# Patient Record
Sex: Female | Born: 1990 | Race: Black or African American | Hispanic: No | Marital: Married | Smoking: Never smoker
Health system: Southern US, Community
[De-identification: ages and names within clinical notes are randomized; demographics above are authoritative.]

## PROBLEM LIST (undated history)

## (undated) ENCOUNTER — Inpatient Hospital Stay (HOSPITAL_COMMUNITY): Payer: Self-pay

## (undated) DIAGNOSIS — F419 Anxiety disorder, unspecified: Secondary | ICD-10-CM

## (undated) DIAGNOSIS — N760 Acute vaginitis: Secondary | ICD-10-CM

## (undated) DIAGNOSIS — M545 Low back pain, unspecified: Secondary | ICD-10-CM

## (undated) DIAGNOSIS — T783XXA Angioneurotic edema, initial encounter: Secondary | ICD-10-CM

## (undated) DIAGNOSIS — G629 Polyneuropathy, unspecified: Secondary | ICD-10-CM

## (undated) DIAGNOSIS — R569 Unspecified convulsions: Secondary | ICD-10-CM

## (undated) DIAGNOSIS — N83209 Unspecified ovarian cyst, unspecified side: Secondary | ICD-10-CM

## (undated) DIAGNOSIS — D649 Anemia, unspecified: Secondary | ICD-10-CM

## (undated) DIAGNOSIS — E559 Vitamin D deficiency, unspecified: Secondary | ICD-10-CM

## (undated) DIAGNOSIS — B9689 Other specified bacterial agents as the cause of diseases classified elsewhere: Secondary | ICD-10-CM

## (undated) DIAGNOSIS — L509 Urticaria, unspecified: Secondary | ICD-10-CM

## (undated) HISTORY — PX: MULTIPLE TOOTH EXTRACTIONS: SHX2053

## (undated) HISTORY — DX: Low back pain, unspecified: M54.50

## (undated) HISTORY — DX: Polyneuropathy, unspecified: G62.9

## (undated) HISTORY — DX: Urticaria, unspecified: L50.9

## (undated) HISTORY — DX: Angioneurotic edema, initial encounter: T78.3XXA

## (undated) HISTORY — DX: Vitamin D deficiency, unspecified: E55.9

## (undated) HISTORY — DX: Low back pain: M54.5

## (undated) HISTORY — DX: Anxiety disorder, unspecified: F41.9

## (undated) HISTORY — DX: Anemia, unspecified: D64.9

## (undated) HISTORY — PX: SALPINGECTOMY: SHX328

## (undated) HISTORY — DX: Unspecified convulsions: R56.9

---

## 2009-12-25 ENCOUNTER — Emergency Department (HOSPITAL_BASED_OUTPATIENT_CLINIC_OR_DEPARTMENT_OTHER): Admission: EM | Admit: 2009-12-25 | Discharge: 2009-12-26 | Payer: Self-pay | Admitting: Emergency Medicine

## 2010-08-13 LAB — WET PREP, GENITAL
Trich, Wet Prep: NONE SEEN
Yeast Wet Prep HPF POC: NONE SEEN

## 2010-08-13 LAB — URINE MICROSCOPIC-ADD ON

## 2010-08-13 LAB — URINALYSIS, ROUTINE W REFLEX MICROSCOPIC
Glucose, UA: NEGATIVE mg/dL
Hgb urine dipstick: NEGATIVE
Ketones, ur: NEGATIVE mg/dL
Protein, ur: NEGATIVE mg/dL
Urobilinogen, UA: 1 mg/dL (ref 0.0–1.0)

## 2010-08-13 LAB — GC/CHLAMYDIA PROBE AMP, GENITAL: Chlamydia, DNA Probe: POSITIVE — AB

## 2011-06-28 ENCOUNTER — Emergency Department (HOSPITAL_BASED_OUTPATIENT_CLINIC_OR_DEPARTMENT_OTHER)
Admission: EM | Admit: 2011-06-28 | Discharge: 2011-06-28 | Disposition: A | Discharge status: Home / Self Care | Payer: Self-pay | Attending: Emergency Medicine | Admitting: Emergency Medicine

## 2011-06-28 ENCOUNTER — Encounter (HOSPITAL_BASED_OUTPATIENT_CLINIC_OR_DEPARTMENT_OTHER): Payer: Self-pay | Admitting: *Deleted

## 2011-06-28 DIAGNOSIS — R109 Unspecified abdominal pain: Secondary | ICD-10-CM | POA: Insufficient documentation

## 2011-06-28 DIAGNOSIS — R52 Pain, unspecified: Secondary | ICD-10-CM

## 2011-06-28 DIAGNOSIS — R350 Frequency of micturition: Secondary | ICD-10-CM | POA: Insufficient documentation

## 2011-06-28 DIAGNOSIS — N83209 Unspecified ovarian cyst, unspecified side: Secondary | ICD-10-CM | POA: Insufficient documentation

## 2011-06-28 LAB — URINALYSIS, ROUTINE W REFLEX MICROSCOPIC
Bilirubin Urine: NEGATIVE
Glucose, UA: NEGATIVE mg/dL
Hgb urine dipstick: NEGATIVE
Ketones, ur: NEGATIVE mg/dL
Protein, ur: NEGATIVE mg/dL
pH: 6.5 (ref 5.0–8.0)

## 2011-06-28 LAB — WET PREP, GENITAL
Trich, Wet Prep: NONE SEEN
Yeast Wet Prep HPF POC: NONE SEEN

## 2011-06-28 NOTE — ED Notes (Signed)
C/o lower abd pain and urinary frequency

## 2011-06-28 NOTE — ED Provider Notes (Signed)
Complains of right lower quadrant pain sudden onset one hour prior to coming here pain much improved with time without treatment feels like ovarian cyst that she's had in the past, only today's episode not as severe discomfort is minimal at present on exam alert appears comfortable right lower quadrant minimally tender no guarding rigidity or rebound Suspect ruptured ovarian cyst  Doug Sou, MD 06/28/11 2119

## 2011-06-28 NOTE — ED Notes (Signed)
NP at bedside.

## 2011-06-28 NOTE — ED Notes (Signed)
Pelvic cart to bs  

## 2011-06-28 NOTE — ED Provider Notes (Signed)
Medical screening examination/treatment/procedure(s) were conducted as a shared visit with non-physician practitioner(s) and myself.  I personally evaluated the patient during the encounter  Doug Sou, MD 06/28/11 2351

## 2011-06-28 NOTE — ED Provider Notes (Signed)
History     CSN: [id number]  Arrival date & time 06/28/11  2029   First MD Initiated Contact with Patient 06/28/11 2101      Chief Complaint  Patient presents with  . Urinary Frequency  . Abdominal Pain    (Consider location/radiation/quality/duration/timing/severity/associated sxs/prior treatment) Patient is a 21 y.o. female presenting with frequency and abdominal pain. The history is provided by the patient. No language interpreter was used.  Urinary Frequency This is a new problem. The current episode started today. The problem occurs intermittently. The problem has been gradually improving. Associated symptoms include abdominal pain. The symptoms are aggravated by walking. She has tried nothing for the symptoms.  Abdominal Pain The primary symptoms of the illness include abdominal pain and vaginal discharge.  Additional symptoms associated with the illness include frequency.    History reviewed. No pertinent past medical history.  History reviewed. No pertinent past surgical history.  No family history on file.  History  Substance Use Topics  . Smoking status: Never Smoker   . Smokeless tobacco: Not on file  . Alcohol Use: No    OB History    Grav Para Term Preterm Abortions TAB SAB Ect Mult Living                  Review of Systems  Gastrointestinal: Positive for abdominal pain.  Genitourinary: Positive for frequency and vaginal discharge.  All other systems reviewed and are negative.    Allergies  Review of patient's allergies indicates not on file.  Home Medications  No current outpatient prescriptions on file.  BP 111/62  Pulse 83  Temp(Src) 98.1 F (36.7 C) (Oral)  Resp 18  Ht 5\' 5"  (1.651 m)  Wt 130 lb (58.968 kg)  BMI 21.63 kg/m2  SpO2 100%  LMP 06/16/2011  Physical Exam  Nursing note and vitals reviewed. Constitutional: She is oriented to person, place, and time. She appears well-developed and well-nourished.  HENT:  Head:  Normocephalic.  Eyes: Conjunctivae are normal. Pupils are equal, round, and reactive to light.  Neck: Normal range of motion. Neck supple.  Cardiovascular: Normal rate, regular rhythm and normal heart sounds.   Pulmonary/Chest: Effort normal and breath sounds normal.  Abdominal: Soft. Bowel sounds are normal.  Genitourinary: Vagina normal. Cervix exhibits no motion tenderness, no discharge and no friability. Right adnexum displays tenderness.  Musculoskeletal: Normal range of motion.  Neurological: She is alert and oriented to person, place, and time.  Skin: Skin is warm and dry.  Psychiatric: She has a normal mood and affect.    ED Course  Procedures (including critical care time)  Labs Reviewed  URINALYSIS, ROUTINE W REFLEX MICROSCOPIC - Abnormal; Notable for the following:    APPearance CLOUDY (*)    All other components within normal limits  PREGNANCY, URINE   No results found.   No diagnosis found.  Possible ruptured ovarian cyst--will have patient return tomorrow for outpatient ultrasound.  MDM          Jimmye Norman, NP 06/28/11 2204

## 2011-06-29 ENCOUNTER — Other Ambulatory Visit (HOSPITAL_BASED_OUTPATIENT_CLINIC_OR_DEPARTMENT_OTHER): Payer: Self-pay

## 2011-06-29 ENCOUNTER — Other Ambulatory Visit (HOSPITAL_BASED_OUTPATIENT_CLINIC_OR_DEPARTMENT_OTHER): Payer: Self-pay | Admitting: Nurse Practitioner

## 2011-06-29 DIAGNOSIS — R1021 Pelvic and perineal pain right side: Secondary | ICD-10-CM

## 2011-06-29 DIAGNOSIS — R102 Pelvic and perineal pain: Secondary | ICD-10-CM

## 2011-06-29 LAB — GC/CHLAMYDIA PROBE AMP, GENITAL: Chlamydia, DNA Probe: NEGATIVE

## 2011-07-04 ENCOUNTER — Inpatient Hospital Stay (HOSPITAL_BASED_OUTPATIENT_CLINIC_OR_DEPARTMENT_OTHER): Admission: RE | Admit: 2011-07-04 | Payer: Self-pay | Source: Ambulatory Visit

## 2012-04-15 ENCOUNTER — Emergency Department (HOSPITAL_BASED_OUTPATIENT_CLINIC_OR_DEPARTMENT_OTHER)
Admission: EM | Admit: 2012-04-15 | Discharge: 2012-04-15 | Disposition: A | Discharge status: Home / Self Care | Payer: Self-pay | Attending: Emergency Medicine | Admitting: Emergency Medicine

## 2012-04-15 ENCOUNTER — Encounter (HOSPITAL_BASED_OUTPATIENT_CLINIC_OR_DEPARTMENT_OTHER): Payer: Self-pay | Admitting: *Deleted

## 2012-04-15 DIAGNOSIS — N760 Acute vaginitis: Secondary | ICD-10-CM | POA: Insufficient documentation

## 2012-04-15 DIAGNOSIS — J029 Acute pharyngitis, unspecified: Secondary | ICD-10-CM | POA: Insufficient documentation

## 2012-04-15 DIAGNOSIS — B9689 Other specified bacterial agents as the cause of diseases classified elsewhere: Secondary | ICD-10-CM

## 2012-04-15 LAB — URINALYSIS, ROUTINE W REFLEX MICROSCOPIC
Bilirubin Urine: NEGATIVE
Ketones, ur: 15 mg/dL — AB
Nitrite: NEGATIVE
Protein, ur: NEGATIVE mg/dL
Urobilinogen, UA: 1 mg/dL (ref 0.0–1.0)
pH: 7.5 (ref 5.0–8.0)

## 2012-04-15 LAB — URINE MICROSCOPIC-ADD ON

## 2012-04-15 LAB — WET PREP, GENITAL
Trich, Wet Prep: NONE SEEN
Yeast Wet Prep HPF POC: NONE SEEN

## 2012-04-15 MED ORDER — METRONIDAZOLE 500 MG PO TABS: 500.0000 mg | ORAL_TABLET | Freq: Two times a day (BID) | ORAL | Status: DC

## 2012-04-15 NOTE — ED Provider Notes (Signed)
Medical screening examination/treatment/procedure(s) were performed by non-physician practitioner and as supervising physician I was immediately available for consultation/collaboration.   Shiron Whetsel B. Bernette Mayers, MD 04/15/12 2303

## 2012-04-15 NOTE — ED Notes (Signed)
Pt c/o vaginal discharge x 2 weeks  

## 2012-04-15 NOTE — ED Provider Notes (Signed)
History     CSN: [id number]  Arrival date & time 04/15/12  2028   First MD Initiated Contact with Patient 04/15/12 2130      Chief Complaint  Patient presents with  . Vaginal Discharge    (Consider location/radiation/quality/duration/timing/severity/associated sxs/prior treatment) Patient is a 21 y.o. female presenting with vaginal discharge. The history is provided by the patient. No language interpreter was used.  Vaginal Discharge This is a new problem. The current episode started today. The problem occurs constantly. The problem has been gradually worsening. Associated symptoms include a sore throat. Nothing aggravates the symptoms. She has tried nothing for the symptoms. The treatment provided moderate relief.  Pt complains of vaginal odor and discharge  History reviewed. No pertinent past medical history.  History reviewed. No pertinent past surgical history.  History reviewed. No pertinent family history.  History  Substance Use Topics  . Smoking status: Never Smoker   . Smokeless tobacco: Not on file  . Alcohol Use: No    OB History    Grav Para Term Preterm Abortions TAB SAB Ect Mult Living                  Review of Systems  HENT: Positive for sore throat.   Genitourinary: Positive for vaginal discharge.  All other systems reviewed and are negative.    Allergies  Review of patient's allergies indicates no known allergies.  Home Medications  No current outpatient prescriptions on file.  BP 113/65  Pulse 80  Temp 98.8 F (37.1 C) (Oral)  Resp 18  Ht 5\' 5"  (1.651 m)  Wt 130 lb (58.968 kg)  BMI 21.63 kg/m2  SpO2 100%  LMP 03/27/2012  Physical Exam  Nursing note and vitals reviewed. Constitutional: She appears well-developed and well-nourished.  HENT:  Head: Normocephalic and atraumatic.  Right Ear: External ear normal.  Left Ear: External ear normal.  Nose: Nose normal.  Mouth/Throat: Oropharynx is clear and moist.  Eyes: Pupils are  equal, round, and reactive to light.  Cardiovascular: Normal rate.   Pulmonary/Chest: Effort normal.  Abdominal: Soft. There is no tenderness.  Genitourinary: Uterus normal. Vaginal discharge found.       Adnexa nontender,  Musculoskeletal: Normal range of motion.  Neurological: She is alert.  Skin: Skin is warm.    ED Course  Procedures (including critical care time)  Labs Reviewed  URINALYSIS, ROUTINE W REFLEX MICROSCOPIC - Abnormal; Notable for the following:    APPearance CLOUDY (*)     Specific Gravity, Urine 1.031 (*)     Ketones, ur 15 (*)     Leukocytes, UA SMALL (*)     All other components within normal limits  URINE MICROSCOPIC-ADD ON - Abnormal; Notable for the following:    Squamous Epithelial / LPF FEW (*)     All other components within normal limits  WET PREP, GENITAL - Abnormal; Notable for the following:    Clue Cells Wet Prep HPF POC MODERATE (*)     WBC, Wet Prep HPF POC FEW (*)     All other components within normal limits  PREGNANCY, URINE  GC/CHLAMYDIA PROBE AMP   No results found.   No diagnosis found.    MDM  Rx for flagyl        Elson Areas, [place] 04/15/12 2227

## 2012-04-17 LAB — GC/CHLAMYDIA PROBE AMP: GC Probe RNA: NEGATIVE

## 2012-08-25 ENCOUNTER — Encounter (HOSPITAL_COMMUNITY): Payer: Self-pay | Admitting: *Deleted

## 2012-08-25 ENCOUNTER — Inpatient Hospital Stay (HOSPITAL_COMMUNITY): Payer: Medicaid Other

## 2012-08-25 ENCOUNTER — Inpatient Hospital Stay (HOSPITAL_COMMUNITY)
Admission: AD | Admit: 2012-08-25 | Discharge: 2012-08-25 | Disposition: A | Discharge status: Home / Self Care | Payer: Medicaid Other | Source: Ambulatory Visit | Attending: Obstetrics & Gynecology | Admitting: Obstetrics & Gynecology

## 2012-08-25 DIAGNOSIS — O2390 Unspecified genitourinary tract infection in pregnancy, unspecified trimester: Secondary | ICD-10-CM | POA: Insufficient documentation

## 2012-08-25 DIAGNOSIS — N760 Acute vaginitis: Secondary | ICD-10-CM

## 2012-08-25 DIAGNOSIS — O26899 Other specified pregnancy related conditions, unspecified trimester: Secondary | ICD-10-CM

## 2012-08-25 DIAGNOSIS — B9689 Other specified bacterial agents as the cause of diseases classified elsewhere: Secondary | ICD-10-CM

## 2012-08-25 DIAGNOSIS — A499 Bacterial infection, unspecified: Secondary | ICD-10-CM

## 2012-08-25 DIAGNOSIS — R109 Unspecified abdominal pain: Secondary | ICD-10-CM | POA: Insufficient documentation

## 2012-08-25 HISTORY — DX: Other specified bacterial agents as the cause of diseases classified elsewhere: N76.0

## 2012-08-25 HISTORY — DX: Other specified bacterial agents as the cause of diseases classified elsewhere: B96.89

## 2012-08-25 HISTORY — DX: Unspecified ovarian cyst, unspecified side: N83.209

## 2012-08-25 LAB — URINALYSIS, ROUTINE W REFLEX MICROSCOPIC
Bilirubin Urine: NEGATIVE
Glucose, UA: NEGATIVE mg/dL
Hgb urine dipstick: NEGATIVE
Ketones, ur: NEGATIVE mg/dL
Nitrite: NEGATIVE
Protein, ur: NEGATIVE mg/dL
Specific Gravity, Urine: 1.025 (ref 1.005–1.030)
Urobilinogen, UA: 1 mg/dL (ref 0.0–1.0)
pH: 5.5 (ref 5.0–8.0)

## 2012-08-25 LAB — WET PREP, GENITAL
Trich, Wet Prep: NONE SEEN
Yeast Wet Prep HPF POC: NONE SEEN

## 2012-08-25 LAB — CBC
Hemoglobin: 11.6 g/dL — ABNORMAL LOW (ref 12.0–15.0)
MCH: 30.8 pg (ref 26.0–34.0)
MCV: 88.1 fL (ref 78.0–100.0)
RBC: 3.77 MIL/uL — ABNORMAL LOW (ref 3.87–5.11)

## 2012-08-25 LAB — URINE MICROSCOPIC-ADD ON

## 2012-08-25 MED ORDER — METRONIDAZOLE 500 MG PO TABS: 500.0000 mg | ORAL_TABLET | Freq: Two times a day (BID) | ORAL | Status: DC

## 2012-08-25 NOTE — MAU Note (Signed)
History of ovarian cyst and has been having shocking pains in the bottom of the abdomen. +HPT

## 2012-08-25 NOTE — MAU Provider Note (Signed)
History     CSN: [id number]  Arrival date & time 08/25/12  1911   None     Chief Complaint  Patient presents with  . Abdominal Pain    (Consider location/radiation/quality/duration/timing/severity/associated sxs/prior treatment) HPI Debra Pruitt is a 22 y.o. G1P0 at [redacted]w[redacted]d. She presents with c/o low abd pain off/on x 2-3 days. The pain is sharp shooting, "shocking", mostly in RLQ. No bleeding or spotting,. Has clumpy white vaginal discharge like a yeast infection, occ itching,had 1 spot of blood 3 d ago. Has frequency, no urgency or burning, no GI changes. Same partner x 2 yr, no hx STD. Hx ovarian cyst 2 yr ago, no f/u.   Past Medical History  Diagnosis Date  . Bacterial vaginosis   . Ovarian cyst     Past Surgical History  Procedure Laterality Date  . Multiple tooth extractions      Family History  Problem Relation Age of Onset  . Hypertension Mother   . Hypertension Maternal Grandmother   . Asthma Maternal Grandmother   . Hypertension Paternal Grandmother   . COPD Paternal Grandmother   . Heart disease Paternal Grandmother   . Hyperlipidemia Paternal Grandmother   . Stroke Paternal Grandmother     History  Substance Use Topics  . Smoking status: Never Smoker   . Smokeless tobacco: Not on file  . Alcohol Use: No    OB History   Grav Para Term Preterm Abortions TAB SAB Ect Mult Living   1               Review of Systems  Constitutional: Negative for fever and chills.  Gastrointestinal: Negative for nausea, vomiting, diarrhea and constipation.  Genitourinary: Positive for frequency, vaginal discharge and pelvic pain. Negative for dysuria, urgency and vaginal bleeding.    Allergies  Review of patient's allergies indicates no known allergies.  Home Medications  No current outpatient prescriptions on file.  BP 123/62  Pulse 91  Temp(Src) 99.6 F (37.6 C) (Oral)  Resp 16  Ht 5\' 5"  (1.651 m)  Wt 135 lb 6 oz (61.406 kg)  BMI 22.53 kg/m2  LMP  07/19/2012  Physical Exam  Constitutional: She is oriented to person, place, and time. She appears well-developed and well-nourished.  Abdominal: Soft. There is no tenderness.  Genitourinary:  Pelvic: Vulva- nl anatomy, 1-2 mm abrasion posterior introitus from recent intercourse Vagina- mod amt creamy,frothy yellow vaginal discharge Cx- closed Uterus- nl size, non tender Adn- non tender, no masses palp  Musculoskeletal: Normal range of motion.  Neurological: She is alert and oriented to person, place, and time.  Skin: Skin is warm and dry.  Psychiatric: She has a normal mood and affect. Her behavior is normal.    ED Course  Procedures (including critical care time)  Labs Reviewed  URINALYSIS, ROUTINE W REFLEX MICROSCOPIC - Abnormal; Notable for the following:    Leukocytes, UA MODERATE (*)    All other components within normal limits  URINE MICROSCOPIC-ADD ON - Abnormal; Notable for the following:    Squamous Epithelial / LPF FEW (*)    All other components within normal limits  POCT PREGNANCY, URINE - Abnormal; Notable for the following:    Preg Test, Ur POSITIVE (*)    All other components within normal limits  GC/CHLAMYDIA PROBE AMP  WET PREP, GENITAL  CBC  HCG, QUANTITATIVE, PREGNANCY  ABO/RH   No results found. Results for orders placed during the hospital encounter of 08/25/12 (from the past 24 hour(s))  URINALYSIS, ROUTINE W REFLEX MICROSCOPIC     Status: Abnormal   Collection Time    08/25/12  7:15 PM      Result Value Range   Color, Urine YELLOW  YELLOW   APPearance CLEAR  CLEAR   Specific Gravity, Urine 1.025  1.005 - 1.030   pH 5.5  5.0 - 8.0   Glucose, UA NEGATIVE  NEGATIVE mg/dL   Hgb urine dipstick NEGATIVE  NEGATIVE   Bilirubin Urine NEGATIVE  NEGATIVE   Ketones, ur NEGATIVE  NEGATIVE mg/dL   Protein, ur NEGATIVE  NEGATIVE mg/dL   Urobilinogen, UA 1.0  0.0 - 1.0 mg/dL   Nitrite NEGATIVE  NEGATIVE   Leukocytes, UA MODERATE (*) NEGATIVE  URINE  MICROSCOPIC-ADD ON     Status: Abnormal   Collection Time    08/25/12  7:15 PM      Result Value Range   Squamous Epithelial / LPF FEW (*) RARE   WBC, UA 7-10  <3 WBC/hpf   RBC / HPF 0-2  <3 RBC/hpf   Bacteria, UA RARE  RARE  POCT PREGNANCY, URINE     Status: Abnormal   Collection Time    08/25/12  7:26 PM      Result Value Range   Preg Test, Ur POSITIVE (*) NEGATIVE  CBC     Status: Abnormal   Collection Time    08/25/12  7:34 PM      Result Value Range   WBC 8.9  4.0 - 10.5 K/uL   RBC 3.77 (*) 3.87 - 5.11 MIL/uL   Hemoglobin 11.6 (*) 12.0 - 15.0 g/dL   HCT 16.1 (*) 09.6 - 04.5 %   MCV 88.1  78.0 - 100.0 fL   MCH 30.8  26.0 - 34.0 pg   MCHC 34.9  30.0 - 36.0 g/dL   RDW 40.9  81.1 - 91.4 %   Platelets 212  150 - 400 K/uL  HCG, QUANTITATIVE, PREGNANCY     Status: Abnormal   Collection Time    08/25/12  7:34 PM      Result Value Range   hCG, Beta Chain, Quant, S 6883 (*) <5 mIU/mL  ABO/RH     Status: None   Collection Time    08/25/12  7:34 PM      Result Value Range   ABO/RH(D) B POS    WET PREP, GENITAL     Status: Abnormal   Collection Time    08/25/12  7:54 PM      Result Value Range   Yeast Wet Prep HPF POC NONE SEEN  NONE SEEN   Trich, Wet Prep NONE SEEN  NONE SEEN   Clue Cells Wet Prep HPF POC MANY (*) NONE SEEN   WBC, Wet Prep HPF POC MANY (*) NONE SEEN   US Ob Comp Less 14 Wks  08/25/2012  *RADIOLOGY REPORT*  Clinical Data: Pelvic pain  OBSTETRIC <14 WK [place] AND TRANSVAGINAL OB US  Technique:  Both transabdominal and transvaginal ultrasound examinations were performed for complete evaluation of the gestation as well as the maternal uterus, adnexal regions, and pelvic cul-de-sac.  Transvaginal technique was performed to assess early pregnancy.  Comparison:  None.  Intrauterine gestational sac:  Oval shaped and fundal. Yolk sac: Present. Embryo: Not visualized. Cardiac Activity: Not applicable. Heart Rate: Not applicable. bpm  MSD: 9.2 mm  fine w for d  Maternal  uterus/adnexae: There is a 2.4 x 2.4 x 2.8 cm fundal fibroid. Small right ovarian corpus luteum  cyst is noted.  Small amount of free fluid is present.  IMPRESSION: There is a gestational sac and yolk sac but no embryo.  Mean sac diameter is only 9 mm.  This is nonspecific and may represent early pregnancy. Serial beta HCG levels and 1-week follow-up ultrasound is recommended to ensure development of an embryo.   Original Report Authenticated By: Jolaine Click, M.D.    US Ob Transvaginal  08/25/2012  *RADIOLOGY REPORT*  Clinical Data: Pelvic pain  OBSTETRIC <14 WK [place] AND TRANSVAGINAL OB US  Technique:  Both transabdominal and transvaginal ultrasound examinations were performed for complete evaluation of the gestation as well as the maternal uterus, adnexal regions, and pelvic cul-de-sac.  Transvaginal technique was performed to assess early pregnancy.  Comparison:  None.  Intrauterine gestational sac:  Oval shaped and fundal. Yolk sac: Present. Embryo: Not visualized. Cardiac Activity: Not applicable. Heart Rate: Not applicable. bpm  MSD: 9.2 mm  fine w for d  Maternal uterus/adnexae: There is a 2.4 x 2.4 x 2.8 cm fundal fibroid. Small right ovarian corpus luteum cyst is noted.  Small amount of free fluid is present.  IMPRESSION: There is a gestational sac and yolk sac but no embryo.  Mean sac diameter is only 9 mm.  This is nonspecific and may represent early pregnancy. Serial beta HCG levels and 1-week follow-up ultrasound is recommended to ensure development of an embryo.   Original Report Authenticated By: Jolaine Click, M.D.      No diagnosis found.    MDM  ASSESSMENT:  Bacterial vaginosis 5 3/7 wks IUGS with yolk sac  PLAN:  Flagyl 500mg  bid x 7 d Preg verification letter to pt To call for an appt to start prenatal care when gets medicaid card

## 2012-08-26 LAB — ABO/RH: ABO/RH(D): B POS

## 2012-08-28 NOTE — MAU Provider Note (Signed)
Attestation of Attending Supervision of Advanced Practitioner (CNM/NP): Evaluation and management procedures were performed by the Advanced Practitioner under my supervision and collaboration. I have reviewed the Advanced Practitioner's note and chart, and I agree with the management and plan.  LEGGETT,KELLY H. 11:31 AM   

## 2012-10-03 ENCOUNTER — Encounter: Payer: Self-pay | Admitting: Obstetrics

## 2013-03-22 ENCOUNTER — Emergency Department (HOSPITAL_BASED_OUTPATIENT_CLINIC_OR_DEPARTMENT_OTHER)
Admission: EM | Admit: 2013-03-22 | Discharge: 2013-03-22 | Disposition: A | Discharge status: Home / Self Care | Payer: Medicaid Other | Attending: Emergency Medicine | Admitting: Emergency Medicine

## 2013-03-22 ENCOUNTER — Encounter (HOSPITAL_BASED_OUTPATIENT_CLINIC_OR_DEPARTMENT_OTHER): Payer: Self-pay | Admitting: Emergency Medicine

## 2013-03-22 DIAGNOSIS — R350 Frequency of micturition: Secondary | ICD-10-CM | POA: Insufficient documentation

## 2013-03-22 DIAGNOSIS — R109 Unspecified abdominal pain: Secondary | ICD-10-CM | POA: Insufficient documentation

## 2013-03-22 DIAGNOSIS — Z8742 Personal history of other diseases of the female genital tract: Secondary | ICD-10-CM | POA: Insufficient documentation

## 2013-03-22 DIAGNOSIS — Z79899 Other long term (current) drug therapy: Secondary | ICD-10-CM | POA: Insufficient documentation

## 2013-03-22 DIAGNOSIS — O9989 Other specified diseases and conditions complicating pregnancy, childbirth and the puerperium: Secondary | ICD-10-CM | POA: Insufficient documentation

## 2013-03-22 DIAGNOSIS — O26899 Other specified pregnancy related conditions, unspecified trimester: Secondary | ICD-10-CM

## 2013-03-22 LAB — URINE MICROSCOPIC-ADD ON

## 2013-03-22 LAB — URINALYSIS, ROUTINE W REFLEX MICROSCOPIC
Bilirubin Urine: NEGATIVE
Hgb urine dipstick: NEGATIVE
Specific Gravity, Urine: 1.021 (ref 1.005–1.030)
pH: 7 (ref 5.0–8.0)

## 2013-03-22 NOTE — ED Provider Notes (Signed)
Medical screening examination/treatment/procedure(s) were performed by non-physician practitioner and as supervising physician I was immediately available for consultation/collaboration.  EKG Interpretation   None         Debra Pruitt. Maryl Blalock, MD 03/22/13 2351

## 2013-03-22 NOTE — ED Provider Notes (Signed)
CSN: [id number]     Arrival date & time 03/22/13  1941 History   First MD Initiated Contact with Patient 03/22/13 2054     Chief Complaint  Patient presents with  . abd pain-pregnancy    (Consider location/radiation/quality/duration/timing/severity/associated sxs/prior Treatment) The history is provided by the patient.     G1P0 gestation 35 weeks, 2 days reports constant lower abdominal cramping that feels like menstrual cramps that began last night.  Pain is worse with movement and with walking.  Denies vaginal discharge, bleeding.  Denies fevers, chills, N/V/D, change in bowel habits, urinary symptoms.   [facility] Drs Ferdinand Cava and Delford Field.  Last appointment was 10/15.  She has had regular prenatal care and has had no complications or problems during this pregnancy. Patient states the baby is moving very well and that is unchanged today.   Past Medical History  Diagnosis Date  . Bacterial vaginosis   . Ovarian cyst    Past Surgical History  Procedure Laterality Date  . Multiple tooth extractions     Family History  Problem Relation Age of Onset  . Hypertension Mother   . Hypertension Maternal Grandmother   . Asthma Maternal Grandmother   . Hypertension Paternal Grandmother   . COPD Paternal Grandmother   . Heart disease Paternal Grandmother   . Hyperlipidemia Paternal Grandmother   . Stroke Paternal Grandmother    History  Substance Use Topics  . Smoking status: Never Smoker   . Smokeless tobacco: Not on file  . Alcohol Use: No   OB History   Grav Para Term Preterm Abortions TAB SAB Ect Mult Living   1              Review of Systems  Constitutional: Negative for fever.  Gastrointestinal: Positive for abdominal pain. Negative for nausea, vomiting and diarrhea.  Genitourinary: Positive for frequency. Negative for dysuria, urgency, vaginal bleeding and vaginal discharge.    Allergies  Review of patient's allergies indicates no known allergies.  Home  Medications   Current Outpatient Rx  Name  Route  Sig  Dispense  Refill  . Prenatal Vit-Fe Fumarate-FA (PRENATAL MULTIVITAMIN) TABS tablet   Oral   Take 1 tablet by mouth daily at 12 noon.         . metroNIDAZOLE (FLAGYL) 500 MG tablet   Oral   Take 1 tablet (500 mg total) by mouth 2 (two) times daily.   14 tablet   0    BP 118/67  Pulse 86  Temp(Src) 99 F (37.2 C) (Oral)  Resp 20  Ht 5\' 5"  (1.651 m)  Wt 165 lb (74.844 kg)  BMI 27.46 kg/m2  SpO2 100%  LMP 07/19/2012 Physical Exam  Nursing note and vitals reviewed. Constitutional: She appears well-developed and well-nourished. No distress.  HENT:  Head: Normocephalic and atraumatic.  Neck: Neck supple.  Cardiovascular: Normal rate and regular rhythm.   Pulmonary/Chest: Effort normal and breath sounds normal. No respiratory distress. She has no wheezes. She has no rales.  Abdominal: Soft. She exhibits no distension. There is tenderness. There is no rebound and no guarding.  gravid  Genitourinary:  Sterile cervical exam 10:11 PM Cervix is closed, average consistency, moderately long.  Baby's head is not descended.   Neurological: She is alert.  Skin: She is not diaphoretic.    ED Course  Procedures (including critical care time) Labs Review Labs Reviewed  URINALYSIS, ROUTINE W REFLEX MICROSCOPIC - Abnormal; Notable for the following:    Leukocytes,  UA TRACE (*)    All other components within normal limits  URINE MICROSCOPIC-ADD ON - Abnormal; Notable for the following:    Squamous Epithelial / LPF FEW (*)    Bacteria, UA FEW (*)    All other components within normal limits  URINE CULTURE   Imaging Review No results found.  EKG Interpretation   None      Pt declines medication for her pain.   MDM   1. Abdominal pain in pregnancy    Pregnant patient with abdominal pain, present only with her own movement.  No contractions on monitor, cervix is closed, no blood.  Pt is not in active labor.  As the pain  is only present with movement and with walking I suspect the pain is related to round ligament pain or other discomforts of later pregnancy.  No e/o UTI on UA.  Discussed pt with Dr Oletta Lamas.  Pt to be d/c home with OB follow up.  Pt advised of all findings and agrees with plan.  She has OB appt in 4 days, I have advised her to call first thing Monday morning, also to go directly to [facility] if she has continued or worsening pain or any new /concerning symptoms.  Pt made aware she is always welcome to return to [facility] ED but that it would be better and a more direct route to her OB to go to [facility]. Discussed findings, treatment, and follow up  with patient.  Pt given return precautions.  Pt verbalizes understanding and agrees with plan.         Trixie Dredge, PA-C 03/22/13 2328  Trixie Dredge, PA-C 03/22/13 2330

## 2013-03-22 NOTE — ED Notes (Signed)
Pt report abd pain onset this AM 0800 and increased during the day denies spotting or bleeding or drainage. Pt report this is her first child

## 2013-03-22 NOTE — Progress Notes (Signed)
Pt presents to [facility] with complaints of contractions. Pt is laughing and smiling per RN. No acute distress. Pt recieves care at [facility] and clinic.

## 2013-03-24 LAB — URINE CULTURE

## 2013-06-30 ENCOUNTER — Encounter (HOSPITAL_COMMUNITY): Payer: Self-pay | Admitting: *Deleted

## 2014-03-30 ENCOUNTER — Encounter (HOSPITAL_COMMUNITY): Payer: Self-pay | Admitting: *Deleted

## 2014-07-28 ENCOUNTER — Emergency Department (HOSPITAL_BASED_OUTPATIENT_CLINIC_OR_DEPARTMENT_OTHER)
Admission: EM | Admit: 2014-07-28 | Discharge: 2014-07-28 | Disposition: A | Discharge status: Home / Self Care | Payer: 59 | Attending: Emergency Medicine | Admitting: Emergency Medicine

## 2014-07-28 ENCOUNTER — Encounter (HOSPITAL_BASED_OUTPATIENT_CLINIC_OR_DEPARTMENT_OTHER): Payer: Self-pay | Admitting: *Deleted

## 2014-07-28 DIAGNOSIS — Z8742 Personal history of other diseases of the female genital tract: Secondary | ICD-10-CM | POA: Insufficient documentation

## 2014-07-28 DIAGNOSIS — M5416 Radiculopathy, lumbar region: Secondary | ICD-10-CM

## 2014-07-28 DIAGNOSIS — M545 Low back pain: Secondary | ICD-10-CM | POA: Diagnosis present

## 2014-07-28 MED ORDER — HYDROCODONE-ACETAMINOPHEN 5-325 MG PO TABS: 2.0000 | ORAL_TABLET | ORAL | Status: DC | PRN

## 2014-07-28 MED ORDER — HYDROCODONE-ACETAMINOPHEN 5-325 MG PO TABS: 1.0000 | ORAL_TABLET | Freq: Four times a day (QID) | ORAL | Status: DC | PRN

## 2014-07-28 NOTE — ED Notes (Signed)
Pt c/o lower back pain which radiates down right leg x 3 weeks

## 2014-07-28 NOTE — ED Provider Notes (Signed)
CSN: [id number]     Arrival date & time 07/28/14  1301 History   First MD Initiated Contact with Patient 07/28/14 1335     Chief Complaint  Patient presents with  . Back Pain     (Consider location/radiation/quality/duration/timing/severity/associated sxs/prior Treatment) HPI Complains of low back pain radiating to left foot onset approximately 3 weeks ago. Today pain radiates to both knees. She denies fever denies trauma denies loss of bladder or bowel control. No other associated symptoms. Treating herself with ibuprofen 800 mg without adequate pain relief. Pain is worse with changing positions improved with remaining still. No other associated symptoms Past Medical History  Diagnosis Date  . Bacterial vaginosis   . Ovarian cyst    Past Surgical History  Procedure Laterality Date  . Multiple tooth extractions     Family History  Problem Relation Age of Onset  . Hypertension Mother   . Hypertension Maternal Grandmother   . Asthma Maternal Grandmother   . Hypertension Paternal Grandmother   . COPD Paternal Grandmother   . Heart disease Paternal Grandmother   . Hyperlipidemia Paternal Grandmother   . Stroke Paternal Grandmother    History  Substance Use Topics  . Smoking status: Never Smoker   . Smokeless tobacco: Not on file  . Alcohol Use: No   OB History    Gravida Para Term Preterm AB TAB SAB Ectopic Multiple Living   1              Review of Systems  Constitutional: Negative.   HENT: Negative.   Respiratory: Negative.   Cardiovascular: Negative.   Gastrointestinal: Negative.   Musculoskeletal: Positive for back pain.  Skin: Negative.   Neurological: Negative.   Psychiatric/Behavioral: Negative.   All other systems reviewed and are negative.     Allergies  Review of patient's allergies indicates no known allergies.  Home Medications   Prior to Admission medications   Not on File   BP 109/74 mmHg  Pulse 72  Temp(Src) 98.9 F (37.2 C) (Oral)  Resp  16  SpO2 100% Physical Exam  Constitutional: She appears well-developed and well-nourished.  HENT:  Head: Normocephalic and atraumatic.  Eyes: Conjunctivae are normal. Pupils are equal, round, and reactive to light.  Neck: Neck supple. No tracheal deviation present. No thyromegaly present.  Cardiovascular: Normal rate and regular rhythm.   No murmur heard. Pulmonary/Chest: Effort normal and breath sounds normal.  Abdominal: Soft. Bowel sounds are normal. She exhibits no distension. There is no tenderness.  Musculoskeletal: Normal range of motion. She exhibits no edema or tenderness.  Entire spine is nontender. She has pain at lumbar area when she sits up from a supine position  Neurological: She is alert. She displays normal reflexes. No cranial nerve deficit. Coordination normal.  DTRs symmetric bilaterally at knee jerk ankle jerk and biceps toes downward going bilaterally gait normal  Skin: Skin is warm and dry. No rash noted.  Psychiatric: She has a normal mood and affect.  Nursing note and vitals reviewed.   ED Course  Procedures (including critical care time) Labs Review Labs Reviewed - No data to display  Imaging Review No results found.   EKG Interpretation None      MDM  Emergent imaging not indicated. Patient has no red flags for back pain. Plan prescription Norco. Referral back to primary care physician. Tylenol or ibuprofen for mild pain. Diagnosis lumbar radiculopathy Final diagnoses:  None        Orlie Dakin, MD 07/28/14 1408

## 2014-07-28 NOTE — Discharge Instructions (Signed)
Back Pain, Adult Take Tylenol or ibuprofen for mild pain or the pain medicine prescribed for bad pain. Contact your physician if having significant pain in one week. Back pain is very common. The pain often gets better over time. The cause of back pain is usually not dangerous. Most people can learn to manage their back pain on their own.  HOME CARE   Stay active. Start with short walks on flat ground if you can. Try to walk farther each day.  Do not sit, drive, or stand in one place for more than 30 minutes. Do not stay in bed.  Do not avoid exercise or work. Activity can help your back heal faster.  Be careful when you bend or lift an object. Bend at your knees, keep the object close to you, and do not twist.  Sleep on a firm mattress. Lie on your side, and bend your knees. If you lie on your back, put a pillow under your knees.  Only take medicines as told by your doctor.  Put ice on the injured area.  Put ice in a plastic bag.  Place a towel between your skin and the bag.  Leave the ice on for 15-20 minutes, 03-04 times a day for the first 2 to 3 days. After that, you can switch between ice and heat packs.  Ask your doctor about back exercises or massage.  Avoid feeling anxious or stressed. Find good ways to deal with stress, such as exercise. GET HELP RIGHT AWAY IF:   Your pain does not go away with rest or medicine.  Your pain does not go away in 1 week.  You have new problems.  You do not feel well.  The pain spreads into your legs.  You cannot control when you poop (bowel movement) or pee (urinate).  Your arms or legs feel weak or lose feeling (numbness).  You feel sick to your stomach (nauseous) or throw up (vomit).  You have belly (abdominal) pain.  You feel like you may pass out (faint). MAKE SURE YOU:   Understand these instructions.  Will watch your condition.  Will get help right away if you are not doing well or get worse. Document Released:  11/01/2007 Document Revised: 08/07/2011 Document Reviewed: 09/16/2013 [facility] Patient Information 2015 [place], [place]. This information is not intended to replace advice given to you by your health care provider. Make sure you discuss any questions you have with your health care provider.

## 2014-09-28 ENCOUNTER — Ambulatory Visit (INDEPENDENT_AMBULATORY_CARE_PROVIDER_SITE_OTHER): Payer: 59 | Admitting: Neurology

## 2014-09-28 ENCOUNTER — Encounter: Payer: Self-pay | Admitting: Neurology

## 2014-09-28 VITALS — BP 104/65 | HR 79 | Ht 65.0 in | Wt 148.0 lb

## 2014-09-28 DIAGNOSIS — R202 Paresthesia of skin: Secondary | ICD-10-CM | POA: Diagnosis not present

## 2014-09-28 DIAGNOSIS — M545 Low back pain: Secondary | ICD-10-CM

## 2014-09-28 MED ORDER — DULOXETINE HCL 60 MG PO CPEP: 60.0000 mg | ORAL_CAPSULE | Freq: Every day | ORAL | Status: DC

## 2014-09-28 NOTE — Progress Notes (Signed)
PATIENT: Debra Pruitt DOB: 1991/05/22  HISTORICAL  Debra Pruitt is a 24 year old right-handed female, accompanied by her boyfriend Josh for evaluation of bilateral upper and  lower extremity deep achy pain  She had a history of depression anxiety, work as a personal care require heavy lifting,  Over the past 3-5 years, she complains of bilateral upper and lower extremity deep achy pain, including bilateral feet, bilateral leg from knee down, also complains of dropping things from her right hand, paresthesia, achiness of bilateral upper extremity, weakness in her right hand, difficulty open a can with her right hand, right palm pain, symptoms gradually getting worse, evolved from intermittent, to almost constant now.  She was still able to work everyday, but with increased difficulty,   She denies significant gait difficulty, no bowel and bladder incontinence  I have reviewed laboratory evaluation in 2016, normal TSH, homocystine, methylmalonic acid level, CBC, CMP, low vitamin D 15 point 5, A1c was normal 5.5, UDS was negative,  REVIEW OF SYSTEMS: Full 14 system review of systems performed and notable only for as above ALLERGIES: No Known Allergies  HOME MEDICATIONS: Current Outpatient Prescriptions  Medication Sig Dispense Refill  . clonazePAM (KLONOPIN) 0.5 MG tablet 3 (three) times daily as needed.     . ergocalciferol (VITAMIN D2) 50000 UNITS capsule Take 50,000 Units by mouth once a week.    Marland Kitchen HYDROcodone-acetaminophen (NORCO) 5-325 MG per tablet Take 1-2 tablets by mouth every 6 (six) hours as needed for moderate pain or severe pain. 20 tablet 0  . traZODone (DESYREL) 50 MG tablet at bedtime.        PAST MEDICAL HISTORY: Past Medical History  Diagnosis Date  . Bacterial vaginosis   . Ovarian cyst   . Peripheral neuropathy   . Anxiety   . Anemia   . Vitamin D deficiency   . Low back pain     PAST SURGICAL HISTORY: Past Surgical History  Procedure  Laterality Date  . Multiple tooth extractions    . Cesarean section  04/30/2013    FAMILY HISTORY: Family History  Problem Relation Age of Onset  . Hypertension Mother   . Hypertension Maternal Grandmother   . Asthma Maternal Grandmother   . Hypertension Paternal Grandmother   . COPD Paternal Grandmother   . Heart disease Paternal Grandmother   . Hyperlipidemia Paternal Grandmother   . Stroke Paternal Grandmother   . Thyroid disease Mother   . Diabetes Paternal Grandmother   . Hepatitis C Paternal Grandmother   . Rheum arthritis Paternal Grandmother   . Healthy Father     SOCIAL HISTORY:  History   Social History  . Marital Status: Single    Spouse Name: N/A  . Number of Children: 1  . Years of Education: Some coll   Occupational History  . PCA    Social History Main Topics  . Smoking status: Never Smoker   . Smokeless tobacco: Not on file  . Alcohol Use: 0.0 oz/week    0 Standard drinks or equivalent per week     Comment: Rare - social  . Drug Use: No  . Sexual Activity: Yes    Birth Control/ Protection: Injection   Other Topics Concern  . Not on file   Social History Narrative   Lives at home with boyfriend and son.   Right-handed.   2 cups caffeine per day.     PHYSICAL EXAM   Filed Vitals:   09/28/14 0928  BP: 104/65  Pulse:  79  Height: 5\' 5"  (1.651 m)  Weight: 148 lb (67.132 kg)    Not recorded      Body mass index is 24.63 kg/(m^2).  PHYSICAL EXAMNIATION:  Gen: NAD, conversant, well nourised, obese, well groomed                     Cardiovascular: Regular rate rhythm, no peripheral edema, warm, nontender. Eyes: Conjunctivae clear without exudates or hemorrhage Neck: Supple, no carotid bruise. Pulmonary: Clear to auscultation bilaterally   NEUROLOGICAL EXAM:  MENTAL STATUS: Speech:    Speech is normal; fluent and spontaneous with normal comprehension.  Cognition:    The patient is oriented to person, place, and time;     recent  and remote memory intact;     language fluent;     normal attention, concentration,     fund of knowledge.  CRANIAL NERVES: CN II: Visual fields are full to confrontation. Fundoscopic exam is normal with sharp discs and no vascular changes. Venous pulsations are present bilaterally. Pupils are 4 mm and briskly reactive to light. Visual acuity is 20/20 bilaterally. CN III, IV, VI: extraocular movement are normal. No ptosis. CN V: Facial sensation is intact to pinprick in all 3 divisions bilaterally. Corneal responses are intact.  CN VII: Face is symmetric with normal eye closure and smile. CN VIII: Hearing is normal to rubbing fingers CN IX, X: Palate elevates symmetrically. Phonation is normal. CN XI: Head turning and shoulder shrug are intact CN XII: Tongue is midline with normal movements and no atrophy.  MOTOR: There is no pronator drift of out-stretched arms. Muscle bulk and tone are normal. Muscle strength is normal.   Shoulder abduction Shoulder external rotation Elbow flexion Elbow extension Wrist flexion Wrist extension Finger abduction Hip flexion Knee flexion Knee extension Ankle dorsi flexion Ankle plantar flexion  R 5 5 5 5 5 5 5 5 5 5 5 5   L 5 5 5 5 5 5 5 5 5 5 5 5     REFLEXES: Reflexes are 2+ and symmetric at the biceps, triceps, knees, and ankles. Plantar responses are flexor.  SENSORY: Light touch, pinprick, position sense, and vibration sense are intact in fingers and toes.  COORDINATION: Rapid alternating movements and fine finger movements are intact. There is no dysmetria on finger-to-nose and heel-knee-shin. There are no abnormal or extraneous movements.   GAIT/STANCE: Posture is normal. Gait is steady with normal steps, base, arm swing, and turning. Heel and toe walking are normal. Tandem gait is normal.  Romberg is absent.   DIAGNOSTIC DATA (LABS, IMAGING, TESTING) - I reviewed patient records, labs, notes, testing and imaging myself where  available.  Lab Results  Component Value Date   WBC 8.9 08/25/2012   HGB 11.6* 08/25/2012   HCT 33.2* 08/25/2012   MCV 88.1 08/25/2012   PLT 212 08/25/2012    ASSESSMENT AND PLAN  Debra Pruitt is a 24 y.o. female  presenting with bilateral upper and lower extremity paresthesia, deep achy pain, hyperreflexia on examinations,  Need to rule out cervical, and lumbar pathology,  MRI of cervical spine, MRI of lumbar spine, if she remains symptomatic, may consider MRI of the brain  Orders Placed This Encounter  Procedures  . MR Cervical Spine Wo Contrast  . MR Lumbar Spine Wo Contrast    New Prescriptions   DULOXETINE (CYMBALTA) 60 MG CAPSULE    Take 1 capsule (60 mg total) by mouth daily.       Aliene Beams  Krista Blue, M.D. Ph.D.  [facility] [address] [place], [place] [address] Ph: [phone] Fax: [phone]

## 2014-10-01 ENCOUNTER — Ambulatory Visit (INDEPENDENT_AMBULATORY_CARE_PROVIDER_SITE_OTHER): Payer: 59

## 2014-10-01 DIAGNOSIS — M545 Low back pain: Secondary | ICD-10-CM

## 2014-10-01 DIAGNOSIS — R202 Paresthesia of skin: Secondary | ICD-10-CM | POA: Diagnosis not present

## 2014-10-19 ENCOUNTER — Ambulatory Visit: Payer: 59 | Admitting: Neurology

## 2014-11-04 ENCOUNTER — Telehealth: Payer: Self-pay | Admitting: *Deleted

## 2014-11-04 ENCOUNTER — Ambulatory Visit: Payer: 59 | Admitting: Neurology

## 2014-11-04 NOTE — Telephone Encounter (Signed)
No showed follow up appt today.

## 2014-11-05 ENCOUNTER — Encounter: Payer: Self-pay | Admitting: Neurology

## 2015-02-07 ENCOUNTER — Emergency Department (HOSPITAL_BASED_OUTPATIENT_CLINIC_OR_DEPARTMENT_OTHER)
Admission: EM | Admit: 2015-02-07 | Discharge: 2015-02-07 | Disposition: A | Discharge status: Home / Self Care | Payer: 59 | Attending: Emergency Medicine | Admitting: Emergency Medicine

## 2015-02-07 ENCOUNTER — Emergency Department (HOSPITAL_BASED_OUTPATIENT_CLINIC_OR_DEPARTMENT_OTHER): Payer: 59

## 2015-02-07 ENCOUNTER — Encounter (HOSPITAL_BASED_OUTPATIENT_CLINIC_OR_DEPARTMENT_OTHER): Payer: Self-pay

## 2015-02-07 DIAGNOSIS — R0600 Dyspnea, unspecified: Secondary | ICD-10-CM | POA: Diagnosis not present

## 2015-02-07 DIAGNOSIS — Z862 Personal history of diseases of the blood and blood-forming organs and certain disorders involving the immune mechanism: Secondary | ICD-10-CM | POA: Insufficient documentation

## 2015-02-07 DIAGNOSIS — E559 Vitamin D deficiency, unspecified: Secondary | ICD-10-CM | POA: Insufficient documentation

## 2015-02-07 DIAGNOSIS — Z79899 Other long term (current) drug therapy: Secondary | ICD-10-CM | POA: Insufficient documentation

## 2015-02-07 DIAGNOSIS — R0981 Nasal congestion: Secondary | ICD-10-CM | POA: Diagnosis present

## 2015-02-07 DIAGNOSIS — Z8669 Personal history of other diseases of the nervous system and sense organs: Secondary | ICD-10-CM | POA: Diagnosis not present

## 2015-02-07 DIAGNOSIS — Z8742 Personal history of other diseases of the female genital tract: Secondary | ICD-10-CM | POA: Diagnosis not present

## 2015-02-07 DIAGNOSIS — F419 Anxiety disorder, unspecified: Secondary | ICD-10-CM | POA: Diagnosis not present

## 2015-02-07 LAB — BASIC METABOLIC PANEL
ANION GAP: 7 (ref 5–15)
BUN: 17 mg/dL (ref 6–20)
CO2: 26 mmol/L (ref 22–32)
Calcium: 9.1 mg/dL (ref 8.9–10.3)
Chloride: 106 mmol/L (ref 101–111)
Creatinine, Ser: 0.72 mg/dL (ref 0.44–1.00)
GFR calc Af Amer: >60 mL/min (ref >60)
GLUCOSE: 89 mg/dL (ref 65–99)
POTASSIUM: 4.3 mmol/L (ref 3.5–5.1)
Sodium: 139 mmol/L (ref 135–145)

## 2015-02-07 LAB — CBC
HEMATOCRIT: 38.6 % (ref 36.0–46.0)
Hemoglobin: 13 g/dL (ref 12.0–15.0)
MCH: 30.8 pg (ref 26.0–34.0)
MCHC: 33.7 g/dL (ref 30.0–36.0)
MCV: 91.5 fL (ref 78.0–100.0)
PLATELETS: 188 10*3/uL (ref 150–400)
RBC: 4.22 MIL/uL (ref 3.87–5.11)
RDW: 11.9 % (ref 11.5–15.5)
WBC: 6.1 10*3/uL (ref 4.0–10.5)

## 2015-02-07 LAB — D-DIMER, QUANTITATIVE: D-Dimer, Quant: 0.66 ug/mL-FEU — ABNORMAL HIGH (ref 0.00–0.48)

## 2015-02-07 MED ORDER — IOHEXOL 350 MG/ML SOLN
100.0000 mL | Freq: Once | INTRAVENOUS | Status: AC | PRN
  Administered 2015-02-07: 100 mL via INTRAVENOUS

## 2015-02-07 MED ORDER — ALBUTEROL SULFATE HFA 108 (90 BASE) MCG/ACT IN AERS
2.0000 | INHALATION_SPRAY | RESPIRATORY_TRACT | Status: DC
  Administered 2015-02-07: 2 via RESPIRATORY_TRACT
  Filled 2015-02-07: qty 6.7

## 2015-02-07 NOTE — ED Notes (Signed)
EDP at bedside to discuss results of lab and radiology test and provide current diagnosis

## 2015-02-07 NOTE — ED Notes (Signed)
Pt teaching provided on use of HHN, pt returned demonstration correctly, opportunity for questions provided

## 2015-02-07 NOTE — ED Notes (Signed)
Pt reports for last week and half having chest congestion and hot flashes with headaches.  Denies nasal congestion or chest pain.  Just reports when she gets busy doing things she feels like she needs to cough something up.  Denies cough.

## 2015-02-07 NOTE — Discharge Instructions (Signed)

## 2015-02-07 NOTE — ED Notes (Signed)
Patient transported to X-ray 

## 2015-02-07 NOTE — ED Provider Notes (Signed)
CSN: [id number]     Arrival date & time 02/07/15  1151 History   First MD Initiated Contact with Patient 02/07/15 1215     Chief Complaint  Patient presents with  . Nasal Congestion      HPI Patient reports exertional shortness of breath over the past week.  She's had cough without fever or chills.  She feels as though she's had some "congestion in her chest".  She does have a strong family history of pulmonary embolism including both her mother and her father.  They're both on anticoagulation this time.  Patient denies recent travel or surgery.  She denies unilateral leg swelling.  She does have occasional chest pain but reports it is nonpleuritic.  Patient is without active chest pain at this time.  She reports even walking around the in the parking lot would make her short of breath.  No history of asthma.  No wheezing ever described.  No family history of reactive airway disease.  Past Medical History  Diagnosis Date  . Bacterial vaginosis   . Ovarian cyst   . Peripheral neuropathy   . Anxiety   . Anemia   . Vitamin D deficiency   . Low back pain    Past Surgical History  Procedure Laterality Date  . Multiple tooth extractions    . Cesarean section  04/30/2013   Family History  Problem Relation Age of Onset  . Hypertension Mother   . Hypertension Maternal Grandmother   . Asthma Maternal Grandmother   . Hypertension Paternal Grandmother   . COPD Paternal Grandmother   . Heart disease Paternal Grandmother   . Hyperlipidemia Paternal Grandmother   . Stroke Paternal Grandmother   . Thyroid disease Mother   . Diabetes Paternal Grandmother   . Hepatitis C Paternal Grandmother   . Rheum arthritis Paternal Grandmother   . Healthy Father    Social History  Substance Use Topics  . Smoking status: Never Smoker   . Smokeless tobacco: None  . Alcohol Use: 0.0 oz/week    0 Standard drinks or equivalent per week     Comment: Rare - social   OB History    Gravida Para Term  Preterm AB TAB SAB Ectopic Multiple Living   1              Review of Systems  All other systems reviewed and are negative.     Allergies  Review of patient's allergies indicates no known allergies.  Home Medications   Prior to Admission medications   Medication Sig Start Date End Date Taking? Authorizing Provider  clonazePAM (KLONOPIN) 0.5 MG tablet 3 (three) times daily as needed.  09/20/14   Historical Provider, MD  DULoxetine (CYMBALTA) 60 MG capsule Take 1 capsule (60 mg total) by mouth daily. 09/28/14   Marcial Pacas, MD  ergocalciferol (VITAMIN D2) 50000 UNITS capsule Take 50,000 Units by mouth once a week.    Historical Provider, MD  HYDROcodone-acetaminophen (NORCO) 5-325 MG per tablet Take 1-2 tablets by mouth every 6 (six) hours as needed for moderate pain or severe pain. 07/28/14   Orlie Dakin, MD  traZODone (DESYREL) 50 MG tablet at bedtime.  07/29/14   Historical Provider, MD   BP 109/69 mmHg  Pulse 74  Temp(Src) 98.3 F (36.8 C) (Oral)  Resp 16  Ht 5\' 5"  (1.651 m)  Wt 150 lb (68.04 kg)  BMI 24.96 kg/m2  SpO2 100%  LMP 01/24/2015 Physical Exam  Constitutional: She is oriented to person,  place, and time. She appears well-developed and well-nourished. No distress.  HENT:  Head: Normocephalic and atraumatic.  Eyes: EOM are normal.  Neck: Normal range of motion.  Cardiovascular: Normal rate, regular rhythm and normal heart sounds.   Pulmonary/Chest: Effort normal and breath sounds normal.  Abdominal: Soft. She exhibits no distension. There is no tenderness.  Musculoskeletal: Normal range of motion. She exhibits no edema or tenderness.  Neurological: She is alert and oriented to person, place, and time.  Skin: Skin is warm and dry.  Psychiatric: She has a normal mood and affect. Judgment normal.  Nursing note and vitals reviewed.   ED Course  Procedures (including critical care time) Labs Review Labs Reviewed  D-DIMER, QUANTITATIVE (NOT AT [facility]) - Abnormal;  Notable for the following:    D-Dimer, Quant 0.66 (*)    All other components within normal limits  CBC  BASIC METABOLIC PANEL    Imaging Review Dg Chest 2 View  02/07/2015   CLINICAL DATA:  Chest congestion for 2 weeks.  No other symptoms.  EXAM: CHEST  2 VIEW  COMPARISON:  None.  FINDINGS: The heart size and mediastinal contours are within normal limits. Both lungs are clear. The visualized skeletal structures are unremarkable.  IMPRESSION: No active cardiopulmonary disease.   Electronically Signed   By: Staci Righter M.D.   On: 02/07/2015 13:27   Ct Angio Chest Pe W/cm &/or Wo Cm  02/07/2015   CLINICAL DATA:  Chest pain, exertional shortness of breath, elevated D-dimer. Nonproductive cough and congestion for 1 week.  EXAM: CT ANGIOGRAPHY CHEST WITH CONTRAST  TECHNIQUE: Multidetector CT imaging of the chest was performed using the standard protocol during bolus administration of intravenous contrast. Multiplanar CT image reconstructions and MIPs were obtained to evaluate the vascular anatomy.  CONTRAST:  166mL OMNIPAQUE IOHEXOL 350 MG/ML SOLN  COMPARISON:  Chest radiographs 02/07/2015  FINDINGS: Pulmonary arterial opacification is adequate without evidence of emboli. Heart is normal in size. There is no pleural or pericardial effusion. A  Small amount of triangular low density soft tissue in the anterior mediastinum likely represents residual thymus. No enlarged axillary, mediastinal, or hilar lymph nodes are identified. A small ground-glass nodule in the right upper lobe measures 3 mm (series 6 image 41), and there is a similar 3 mm nodule in the left lower lobe (series 6, image 53) both of which are likely benign and inflammatory in a patient of this age. Major airways are patent.  Visualized portion of the upper abdomen is unremarkable. No acute osseous abnormality is seen.  Review of the MIP images confirms the above findings.  IMPRESSION: No evidence of pulmonary emboli.   Electronically Signed    By: Logan Bores M.D.   On: 02/07/2015 14:02   I have personally reviewed and evaluated these images and lab results as part of my medical decision-making.   EKG Interpretation None      MDM   Final diagnoses:  None    Mildly elevated d-dimer.  Given family history patient will undergo CT angiogram of her chest to evaluate for PE.  This could represent bronchospasm and likely will benefit from bronchodilators.  Chest x-ray without acute abnormalities.  Labs without significant abnormality.    Jola Schmidt, MD 02/07/15 [phone]

## 2015-02-07 NOTE — ED Notes (Signed)
MD at bedside. 

## 2015-06-29 ENCOUNTER — Emergency Department (HOSPITAL_COMMUNITY)
Admission: EM | Admit: 2015-06-29 | Discharge: 2015-06-30 | Disposition: A | Discharge status: Home / Self Care | Payer: BLUE CROSS/BLUE SHIELD | Attending: Emergency Medicine | Admitting: Emergency Medicine

## 2015-06-29 ENCOUNTER — Encounter (HOSPITAL_COMMUNITY): Payer: Self-pay

## 2015-06-29 DIAGNOSIS — O9989 Other specified diseases and conditions complicating pregnancy, childbirth and the puerperium: Secondary | ICD-10-CM | POA: Insufficient documentation

## 2015-06-29 DIAGNOSIS — O99340 Other mental disorders complicating pregnancy, unspecified trimester: Secondary | ICD-10-CM | POA: Insufficient documentation

## 2015-06-29 DIAGNOSIS — R102 Pelvic and perineal pain: Secondary | ICD-10-CM | POA: Diagnosis not present

## 2015-06-29 DIAGNOSIS — Z8669 Personal history of other diseases of the nervous system and sense organs: Secondary | ICD-10-CM | POA: Diagnosis not present

## 2015-06-29 DIAGNOSIS — R1030 Lower abdominal pain, unspecified: Secondary | ICD-10-CM | POA: Diagnosis not present

## 2015-06-29 DIAGNOSIS — Z8742 Personal history of other diseases of the female genital tract: Secondary | ICD-10-CM | POA: Diagnosis not present

## 2015-06-29 DIAGNOSIS — Z3A00 Weeks of gestation of pregnancy not specified: Secondary | ICD-10-CM | POA: Insufficient documentation

## 2015-06-29 DIAGNOSIS — Z79899 Other long term (current) drug therapy: Secondary | ICD-10-CM | POA: Insufficient documentation

## 2015-06-29 DIAGNOSIS — Z8619 Personal history of other infectious and parasitic diseases: Secondary | ICD-10-CM | POA: Insufficient documentation

## 2015-06-29 DIAGNOSIS — F419 Anxiety disorder, unspecified: Secondary | ICD-10-CM | POA: Insufficient documentation

## 2015-06-29 DIAGNOSIS — O99280 Endocrine, nutritional and metabolic diseases complicating pregnancy, unspecified trimester: Secondary | ICD-10-CM | POA: Diagnosis not present

## 2015-06-29 DIAGNOSIS — E559 Vitamin D deficiency, unspecified: Secondary | ICD-10-CM | POA: Insufficient documentation

## 2015-06-29 DIAGNOSIS — Z862 Personal history of diseases of the blood and blood-forming organs and certain disorders involving the immune mechanism: Secondary | ICD-10-CM | POA: Insufficient documentation

## 2015-06-29 DIAGNOSIS — Z3490 Encounter for supervision of normal pregnancy, unspecified, unspecified trimester: Secondary | ICD-10-CM

## 2015-06-29 NOTE — ED Notes (Signed)
Pt complains of pelvic pain since Friday, was on her cycle

## 2015-06-30 ENCOUNTER — Emergency Department (HOSPITAL_COMMUNITY): Payer: BLUE CROSS/BLUE SHIELD

## 2015-06-30 LAB — BASIC METABOLIC PANEL
Anion gap: 8 (ref 5–15)
BUN: 11 mg/dL (ref 6–20)
CALCIUM: 8.7 mg/dL — AB (ref 8.9–10.3)
CO2: 21 mmol/L — ABNORMAL LOW (ref 22–32)
CREATININE: 0.79 mg/dL (ref 0.44–1.00)
Chloride: 107 mmol/L (ref 101–111)
GFR calc Af Amer: >60 mL/min (ref >60)
GLUCOSE: 124 mg/dL — AB (ref 65–99)
POTASSIUM: 3.4 mmol/L — AB (ref 3.5–5.1)
SODIUM: 136 mmol/L (ref 135–145)

## 2015-06-30 LAB — URINALYSIS, ROUTINE W REFLEX MICROSCOPIC
BILIRUBIN URINE: NEGATIVE
GLUCOSE, UA: NEGATIVE mg/dL
KETONES UR: NEGATIVE mg/dL
Leukocytes, UA: NEGATIVE
Nitrite: NEGATIVE
PROTEIN: NEGATIVE mg/dL
Specific Gravity, Urine: 1.015 (ref 1.005–1.030)
pH: 6.5 (ref 5.0–8.0)

## 2015-06-30 LAB — CBC
HCT: 34.9 % — ABNORMAL LOW (ref 36.0–46.0)
Hemoglobin: 12.2 g/dL (ref 12.0–15.0)
MCH: 31.8 pg (ref 26.0–34.0)
MCHC: 35 g/dL (ref 30.0–36.0)
MCV: 90.9 fL (ref 78.0–100.0)
PLATELETS: 197 10*3/uL (ref 150–400)
RBC: 3.84 MIL/uL — AB (ref 3.87–5.11)
RDW: 12.5 % (ref 11.5–15.5)
WBC: 9.8 10*3/uL (ref 4.0–10.5)

## 2015-06-30 LAB — PREGNANCY, URINE: PREG TEST UR: POSITIVE — AB

## 2015-06-30 LAB — URINE MICROSCOPIC-ADD ON

## 2015-06-30 LAB — HCG, QUANTITATIVE, PREGNANCY: hCG, Beta Chain, Quant, S: 1096 m[IU]/mL — ABNORMAL HIGH (ref <5)

## 2015-06-30 MED ORDER — MORPHINE SULFATE (PF) 4 MG/ML IV SOLN
4.0000 mg | Freq: Once | INTRAVENOUS | Status: AC
  Administered 2015-06-30: 4 mg via INTRAVENOUS
  Filled 2015-06-30: qty 1

## 2015-06-30 MED ORDER — ONDANSETRON HCL 4 MG/2ML IJ SOLN
4.0000 mg | Freq: Once | INTRAMUSCULAR | Status: AC
  Administered 2015-06-30: 4 mg via INTRAVENOUS
  Filled 2015-06-30: qty 2

## 2015-06-30 NOTE — ED Notes (Signed)
MD at bedside. 

## 2015-06-30 NOTE — Discharge Instructions (Signed)
Please call your obstetrician for follow-up.  He will need repeat pregnancy hormone levels and a repeat ultrasound likely early next week.  Please return to the emergency department for any new or worsening symptoms including worsening abdominal pain or development of vaginal bleeding.  Please take prenatal vitamin daily First Trimester of Pregnancy The first trimester of pregnancy is from week 1 until the end of week 12 (months 1 through 3). A week after a sperm fertilizes an egg, the egg will implant on the wall of the uterus. This embryo will begin to develop into a baby. Genes from you and your partner are forming the baby. The female genes determine whether the baby is a boy or a girl. At 6-8 weeks, the eyes and face are formed, and the heartbeat can be seen on ultrasound. At the end of 12 weeks, all the baby's organs are formed.  Now that you are pregnant, you will want to do everything you can to have a healthy baby. Two of the most important things are to get good prenatal care and to follow your health care provider's instructions. Prenatal care is all the medical care you receive before the baby's birth. This care will help prevent, find, and treat any problems during the pregnancy and childbirth. BODY CHANGES Your body goes through many changes during pregnancy. The changes vary from woman to woman.   You may gain or lose a couple of pounds at first.  You may feel sick to your stomach (nauseous) and throw up (vomit). If the vomiting is uncontrollable, call your health care provider.  You may tire easily.  You may develop headaches that can be relieved by medicines approved by your health care provider.  You may urinate more often. Painful urination may mean you have a bladder infection.  You may develop heartburn as a result of your pregnancy.  You may develop constipation because certain hormones are causing the muscles that push waste through your intestines to slow down.  You may  develop hemorrhoids or swollen, bulging veins (varicose veins).  Your breasts may begin to grow larger and become tender. Your nipples may stick out more, and the tissue that surrounds them (areola) may become darker.  Your gums may bleed and may be sensitive to brushing and flossing.  Dark spots or blotches (chloasma, mask of pregnancy) may develop on your face. This will likely fade after the baby is born.  Your menstrual periods will stop.  You may have a loss of appetite.  You may develop cravings for certain kinds of food.  You may have changes in your emotions from day to day, such as being excited to be pregnant or being concerned that something may go wrong with the pregnancy and baby.  You may have more vivid and strange dreams.  You may have changes in your hair. These can include thickening of your hair, rapid growth, and changes in texture. Some women also have hair loss during or after pregnancy, or hair that feels dry or thin. Your hair will most likely return to normal after your baby is born. WHAT TO EXPECT AT YOUR PRENATAL VISITS During a routine prenatal visit:  You will be weighed to make sure you and the baby are growing normally.  Your blood pressure will be taken.  Your abdomen will be measured to track your baby's growth.  The fetal heartbeat will be listened to starting around week 10 or 12 of your pregnancy.  Test results from any previous  visits will be discussed. Your health care provider may ask you:  How you are feeling.  If you are feeling the baby move.  If you have had any abnormal symptoms, such as leaking fluid, bleeding, severe headaches, or abdominal cramping.  If you are using any tobacco products, including cigarettes, chewing tobacco, and electronic cigarettes.  If you have any questions. Other tests that may be performed during your first trimester include:  Blood tests to find your blood type and to check for the presence of any  previous infections. They will also be used to check for low iron levels (anemia) and Rh antibodies. Later in the pregnancy, blood tests for diabetes will be done along with other tests if problems develop.  Urine tests to check for infections, diabetes, or protein in the urine.  An ultrasound to confirm the proper growth and development of the baby.  An amniocentesis to check for possible genetic problems.  Fetal screens for spina bifida and Down syndrome.  You may need other tests to make sure you and the baby are doing well.  HIV (human immunodeficiency virus) testing. Routine prenatal testing includes screening for HIV, unless you choose not to have this test. HOME CARE INSTRUCTIONS  Medicines  Follow your health care provider's instructions regarding medicine use. Specific medicines may be either safe or unsafe to take during pregnancy.  Take your prenatal vitamins as directed.  If you develop constipation, try taking a stool softener if your health care provider approves. Diet  Eat regular, well-balanced meals. Choose a variety of foods, such as meat or vegetable-based protein, fish, milk and low-fat dairy products, vegetables, fruits, and whole grain breads and cereals. Your health care provider will help you determine the amount of weight gain that is right for you.  Avoid raw meat and uncooked cheese. These carry germs that can cause birth defects in the baby.  Eating four or five small meals rather than three large meals a day may help relieve nausea and vomiting. If you start to feel nauseous, eating a few soda crackers can be helpful. Drinking liquids between meals instead of during meals also seems to help nausea and vomiting.  If you develop constipation, eat more high-fiber foods, such as fresh vegetables or fruit and whole grains. Drink enough fluids to keep your urine clear or pale yellow. Activity and Exercise  Exercise only as directed by your health care provider.  Exercising will help you:  Control your weight.  Stay in shape.  Be prepared for labor and delivery.  Experiencing pain or cramping in the lower abdomen or low back is a good sign that you should stop exercising. Check with your health care provider before continuing normal exercises.  Try to avoid standing for long periods of time. Move your legs often if you must stand in one place for a long time.  Avoid heavy lifting.  Wear low-heeled shoes, and practice good posture.  You may continue to have sex unless your health care provider directs you otherwise. Relief of Pain or Discomfort  Wear a good support bra for breast tenderness.   Take warm sitz baths to soothe any pain or discomfort caused by hemorrhoids. Use hemorrhoid cream if your health care provider approves.   Rest with your legs elevated if you have leg cramps or low back pain.  If you develop varicose veins in your legs, wear support hose. Elevate your feet for 15 minutes, 3-4 times a day. Limit salt in your diet. Prenatal  Care  Schedule your prenatal visits by the twelfth week of pregnancy. They are usually scheduled monthly at first, then more often in the last 2 months before delivery.  Write down your questions. Take them to your prenatal visits.  Keep all your prenatal visits as directed by your health care provider. Safety  Wear your seat belt at all times when driving.  Make a list of emergency phone numbers, including numbers for family, friends, the hospital, and police and fire departments. General Tips  Ask your health care provider for a referral to a local prenatal education class. Begin classes no later than at the beginning of month 6 of your pregnancy.  Ask for help if you have counseling or nutritional needs during pregnancy. Your health care provider can offer advice or refer you to specialists for help with various needs.  Do not use hot tubs, steam rooms, or saunas.  Do not douche or use  tampons or scented sanitary pads.  Do not cross your legs for long periods of time.  Avoid cat litter boxes and soil used by cats. These carry germs that can cause birth defects in the baby and possibly loss of the fetus by miscarriage or stillbirth.  Avoid all smoking, herbs, alcohol, and medicines not prescribed by your health care provider. Chemicals in these affect the formation and growth of the baby.  Do not use any tobacco products, including cigarettes, chewing tobacco, and electronic cigarettes. If you need help quitting, ask your health care provider. You may receive counseling support and other resources to help you quit.  Schedule a dentist appointment. At home, brush your teeth with a soft toothbrush and be gentle when you floss. SEEK MEDICAL CARE IF:   You have dizziness.  You have mild pelvic cramps, pelvic pressure, or nagging pain in the abdominal area.  You have persistent nausea, vomiting, or diarrhea.  You have a bad smelling vaginal discharge.  You have pain with urination.  You notice increased swelling in your face, hands, legs, or ankles. SEEK IMMEDIATE MEDICAL CARE IF:   You have a fever.  You are leaking fluid from your vagina.  You have spotting or bleeding from your vagina.  You have severe abdominal cramping or pain.  You have rapid weight gain or loss.  You vomit blood or material that looks like coffee grounds.  You are exposed to [place] measles and have never had them.  You are exposed to fifth disease or chickenpox.  You develop a severe headache.  You have shortness of breath.  You have any kind of trauma, such as from a fall or a car accident.   This information is not intended to replace advice given to you by your health care provider. Make sure you discuss any questions you have with your health care provider.   Document Released: 05/09/2001 Document Revised: 06/05/2014 Document Reviewed: 03/25/2013 Elsevier Interactive Patient  Education [employer].

## 2015-06-30 NOTE — ED Notes (Signed)
Discharge instructions and follow up care reviewed with patient. Patient verbalized understanding. 

## 2015-06-30 NOTE — ED Provider Notes (Signed)
CSN: [id number]     Arrival date & time 06/29/15  2302 History  By signing my name below, I, Sonum Patel, attest that this documentation has been prepared under the direction and in the presence of Jola Schmidt, MD. Electronically Signed: Sonum Patel, Education administrator. 06/30/2015. 12:35 AM.    Chief Complaint  Patient presents with  . Pelvic Pain   The history is provided by the patient. No language interpreter was used.     HPI Comments: Debra Pruitt is a 25 y.o. female who presents to the Emergency Department complaining of gradual onset, constant, gradually worsening suprapubic pain that began 5 days ago. Patient states the pain initially began during her menstrual period and felt like cramps. She states the pain worsened to feeling similar to contractions. She has taken ibuprofen which relieved the pain for a few hours only. She denies dysuria, frequency, hematuria, vaginal bleeding, vaginal discharge, vaginal pain.   Past Medical History  Diagnosis Date  . Bacterial vaginosis   . Ovarian cyst   . Peripheral neuropathy ([place])   . Anxiety   . Anemia   . Vitamin D deficiency   . Low back pain    Past Surgical History  Procedure Laterality Date  . Multiple tooth extractions    . Cesarean section  04/30/2013   Family History  Problem Relation Age of Onset  . Hypertension Mother   . Hypertension Maternal Grandmother   . Asthma Maternal Grandmother   . Hypertension Paternal Grandmother   . COPD Paternal Grandmother   . Heart disease Paternal Grandmother   . Hyperlipidemia Paternal Grandmother   . Stroke Paternal Grandmother   . Thyroid disease Mother   . Diabetes Paternal Grandmother   . Hepatitis C Paternal Grandmother   . Rheum arthritis Paternal Grandmother   . Healthy Father    Social History  Substance Use Topics  . Smoking status: Never Smoker   . Smokeless tobacco: None  . Alcohol Use: 0.0 oz/week    0 Standard drinks or equivalent per week     Comment: Rare - social    OB History    Gravida Para Term Preterm AB TAB SAB Ectopic Multiple Living   1              Review of Systems  Gastrointestinal: Positive for abdominal pain (suprapubic).  Genitourinary: Negative for dysuria, frequency, hematuria, vaginal bleeding, vaginal discharge, difficulty urinating and vaginal pain.  All other systems reviewed and are negative.   Allergies  Review of patient's allergies indicates no known allergies.  Home Medications   Prior to Admission medications   Medication Sig Start Date End Date Taking? Authorizing Provider  ergocalciferol (VITAMIN D2) 50000 UNITS capsule Take 50,000 Units by mouth once a week.   Yes Historical Provider, MD  MONONESSA 0.25-35 MG-MCG tablet Take 1 tablet by mouth daily. 06/07/15  Yes Historical Provider, MD  DULoxetine (CYMBALTA) 60 MG capsule Take 1 capsule (60 mg total) by mouth daily. Patient not taking: Reported on 06/29/2015 09/28/14   Marcial Pacas, MD  HYDROcodone-acetaminophen (NORCO) 5-325 MG per tablet Take 1-2 tablets by mouth every 6 (six) hours as needed for moderate pain or severe pain. Patient not taking: Reported on 06/29/2015 07/28/14   Orlie Dakin, MD   BP 130/86 mmHg  Pulse 92  Temp(Src) 97.3 F (36.3 C) (Oral)  Resp 20  Ht 5\' 5"  (1.651 m)  Wt 155 lb (70.308 kg)  BMI 25.79 kg/m2  SpO2 100%  LMP 06/28/2015 Physical Exam  Constitutional: She is oriented to person, place, and time. She appears well-developed and well-nourished. No distress.  HENT:  Head: Normocephalic and atraumatic.  Eyes: EOM are normal.  Neck: Normal range of motion.  Cardiovascular: Normal rate, regular rhythm and normal heart sounds.   Pulmonary/Chest: Effort normal and breath sounds normal.  Abdominal: Soft. She exhibits no distension. There is tenderness in the suprapubic area.  Musculoskeletal: Normal range of motion.  Neurological: She is alert and oriented to person, place, and time.  Skin: Skin is warm and dry.  Psychiatric: She has a  normal mood and affect. Judgment normal.  Nursing note and vitals reviewed.   ED Course  Procedures (including critical care time)  DIAGNOSTIC STUDIES: Oxygen Saturation is 100% on RA, normal by my interpretation.    COORDINATION OF CARE: 12:39 AM Discussed treatment plan with pt at bedside and pt agreed to plan.   Labs Review Labs Reviewed  CBC - Abnormal; Notable for the following:    RBC 3.84 (*)    HCT 34.9 (*)    All other components within normal limits  BASIC METABOLIC PANEL - Abnormal; Notable for the following:    Potassium 3.4 (*)    CO2 21 (*)    Glucose, Bld 124 (*)    Calcium 8.7 (*)    All other components within normal limits  URINALYSIS, ROUTINE W REFLEX MICROSCOPIC (NOT AT [facility]) - Abnormal; Notable for the following:    Hgb urine dipstick SMALL (*)    All other components within normal limits  PREGNANCY, URINE - Abnormal; Notable for the following:    Preg Test, Ur POSITIVE (*)    All other components within normal limits  URINE MICROSCOPIC-ADD ON - Abnormal; Notable for the following:    Squamous Epithelial / LPF 0-5 (*)    Bacteria, UA RARE (*)    All other components within normal limits  HCG, QUANTITATIVE, PREGNANCY    Imaging Review US Ob Comp Less 14 Wks  06/30/2015  CLINICAL DATA:  Pregnant patient with pelvic pain for 4 days. Vaginal bleeding. Beta HCG 1,096. EXAM: OBSTETRIC <14 WK [place] AND TRANSVAGINAL OB US TECHNIQUE: Both transabdominal and transvaginal ultrasound examinations were performed for complete evaluation of the gestation as well as the maternal uterus, adnexal regions, and pelvic cul-de-sac. Transvaginal technique was performed to assess early pregnancy. COMPARISON:  None. FINDINGS: Intrauterine gestational sac: Not present. Yolk sac:  Not present. Embryo:  Not present. Maternal uterus/adnexae: The uterus measures 9.1 x 4.7 x 7.0 cm, best assessed transvaginally. Small fibroid is seen about the anterior fundus measuring 1.2 cm. Endometrial  thickness of 16.3 mm. Right ovary measures 4.0 x 1.2 x 3.1 cm and contains a complex 2.0 cm cyst, may reflect corpus luteum. Left ovary measures 3.2 x 1.3 x 2.3 cm. Small pelvic free fluid. IMPRESSION: No intrauterine gestation. No sonographic findings suspicious for ectopic pregnancy. Findings consistent with pregnancy of unknown location, possibly too early to visualize sonographically. Recommend trending of beta HCG and sonographic follow-up in 10-14 days. Electronically Signed   By: Jeb Levering M.D.   On: 06/30/2015 03:58   US Ob Transvaginal  06/30/2015  CLINICAL DATA:  Pregnant patient with pelvic pain for 4 days. Vaginal bleeding. Beta HCG 1,096. EXAM: OBSTETRIC <14 WK [place] AND TRANSVAGINAL OB US TECHNIQUE: Both transabdominal and transvaginal ultrasound examinations were performed for complete evaluation of the gestation as well as the maternal uterus, adnexal regions, and pelvic cul-de-sac. Transvaginal technique was performed to assess early pregnancy.  COMPARISON:  None. FINDINGS: Intrauterine gestational sac: Not present. Yolk sac:  Not present. Embryo:  Not present. Maternal uterus/adnexae: The uterus measures 9.1 x 4.7 x 7.0 cm, best assessed transvaginally. Small fibroid is seen about the anterior fundus measuring 1.2 cm. Endometrial thickness of 16.3 mm. Right ovary measures 4.0 x 1.2 x 3.1 cm and contains a complex 2.0 cm cyst, may reflect corpus luteum. Left ovary measures 3.2 x 1.3 x 2.3 cm. Small pelvic free fluid. IMPRESSION: No intrauterine gestation. No sonographic findings suspicious for ectopic pregnancy. Findings consistent with pregnancy of unknown location, possibly too early to visualize sonographically. Recommend trending of beta HCG and sonographic follow-up in 10-14 days. Electronically Signed   By: Jeb Levering M.D.   On: 06/30/2015 03:58   I have personally reviewed and evaluated these lab results as part of my medical decision-making.   EKG Interpretation None       MDM   Final diagnoses:  Lower abdominal pain  Pregnant    Patient is well-appearing.  Repeat abdominal exam is benign.  Vital signs are normal.  Early pregnancy.  Patient does have an obstetrician.  I've asked that she call the office tomorrow for close follow-up.  She will need repeat beta hCG and repeat ultrasound within the next 5-7 days.  Patient understands return to the emergency department for new or worsening symptoms including but not limited to worsening abdominal pain or development of vaginal bleeding  I personally performed the services described in this documentation, which was scribed in my presence. The recorded information has been reviewed and is accurate.       Jola Schmidt, MD 06/30/15 [id number]

## 2017-06-08 IMAGING — CT CT ANGIO CHEST
2 of 6 series · 19 of 36 positions shown · IV contrast (APPLIED)
Comparison: Chest radiographs 02/07/2015

CLINICAL DATA: Chest pain, exertional shortness of breath, elevated
D-dimer. Nonproductive cough and congestion for 1 week.

EXAM:
CT ANGIOGRAPHY CHEST WITH CONTRAST
TECHNIQUE: Multidetector CT imaging of the chest was performed using the
standard protocol during bolus administration of intravenous
contrast. Multiplanar CT image reconstructions and MIPs were
obtained to evaluate the vascular anatomy.
CONTRAST:  100mL OMNIPAQUE IOHEXOL 350 MG/ML SOLN

[Series 5: pe 1.0 b26f · axial · 0.66mm/px · z∈[-340,-88]mm · 18 of 280 slices shown]
[im 14/280  lung]
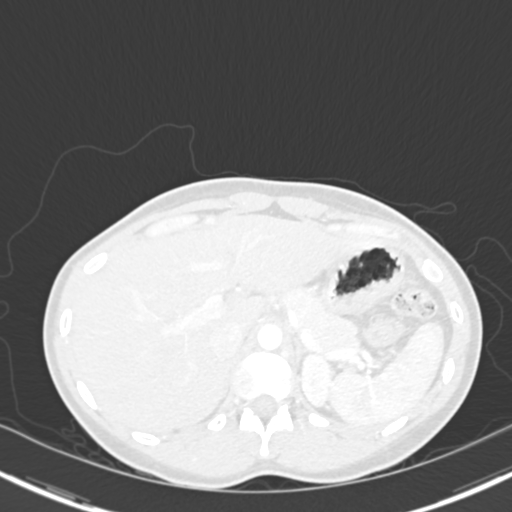
[im 28/280  mediastinal]
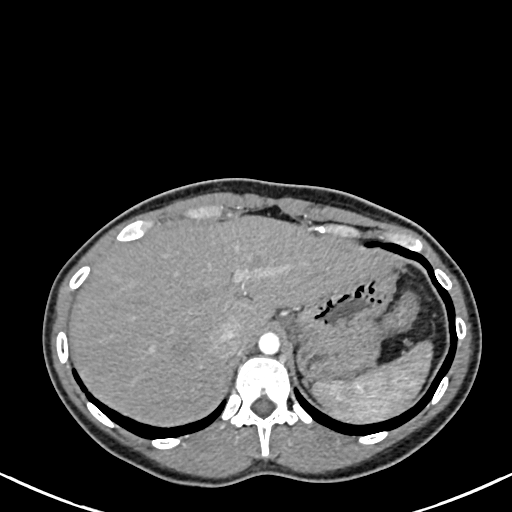
[im 42/280  lung]
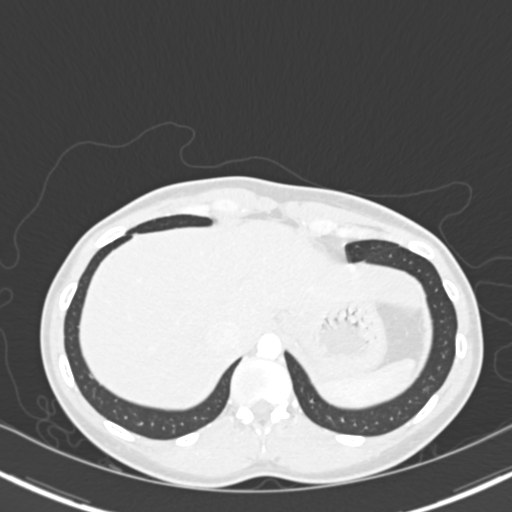
[im 56/280  mediastinal]
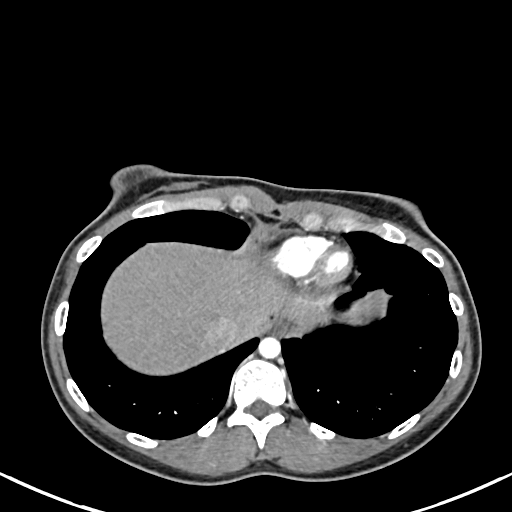
[im 70/280  lung]
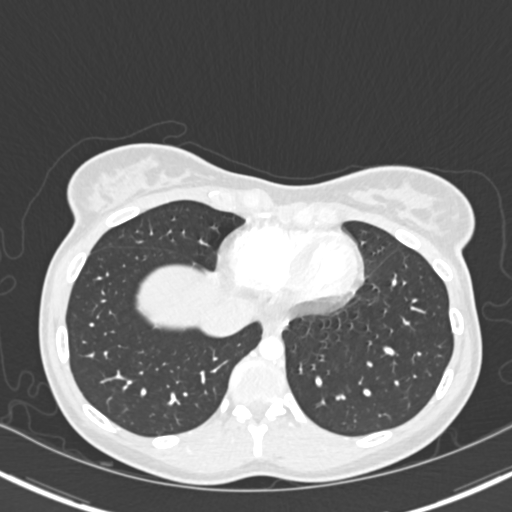
[im 84/280  mediastinal]
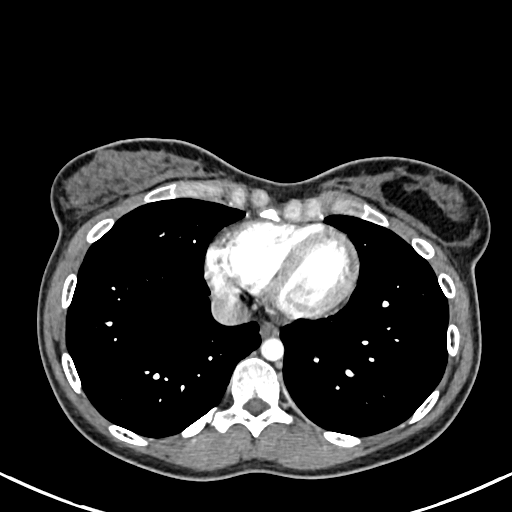
[im 98/280  lung]
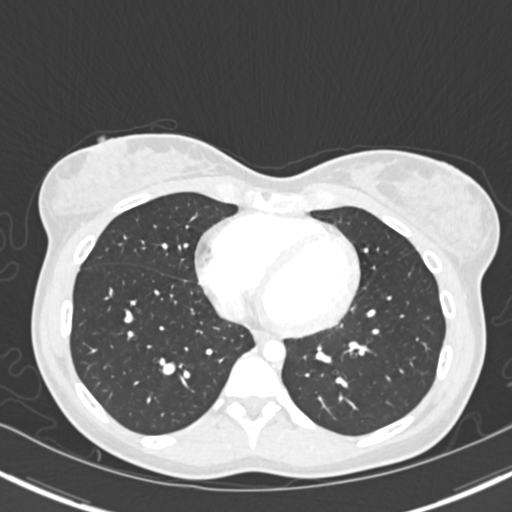
[im 112/280  mediastinal]
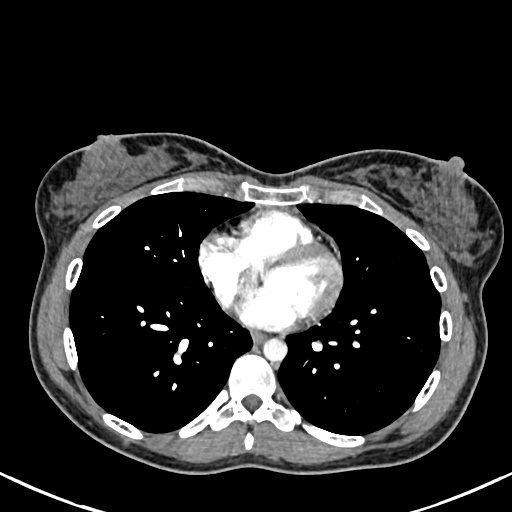
[im 126/280  lung]
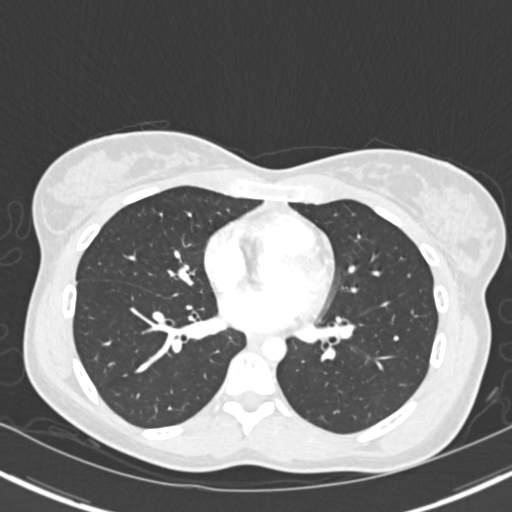
[im 154/280  mediastinal]
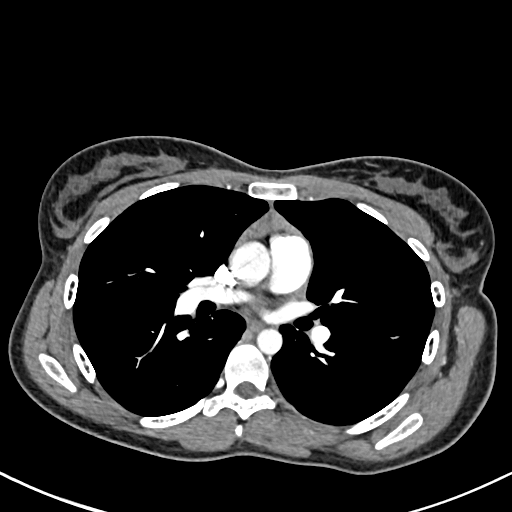
[im 168/280  lung]
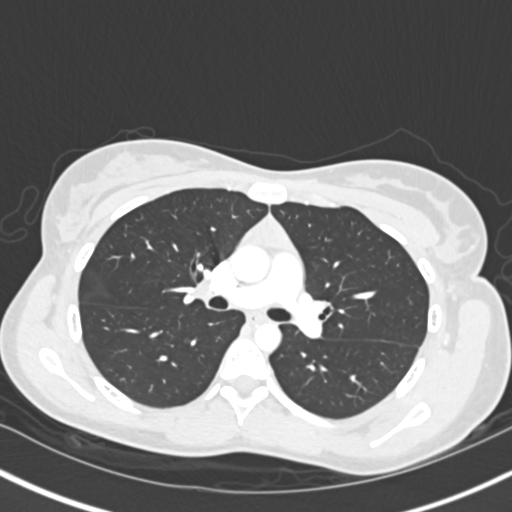
[im 182/280  mediastinal]
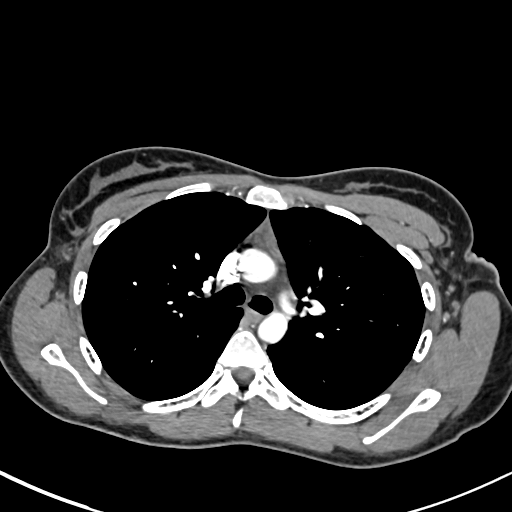
[im 196/280  lung]
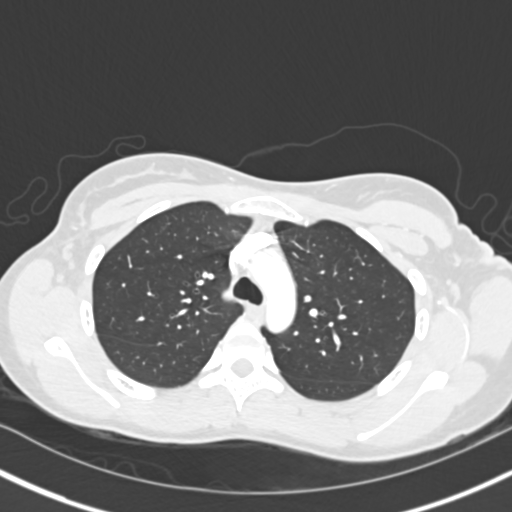
[im 210/280  mediastinal]
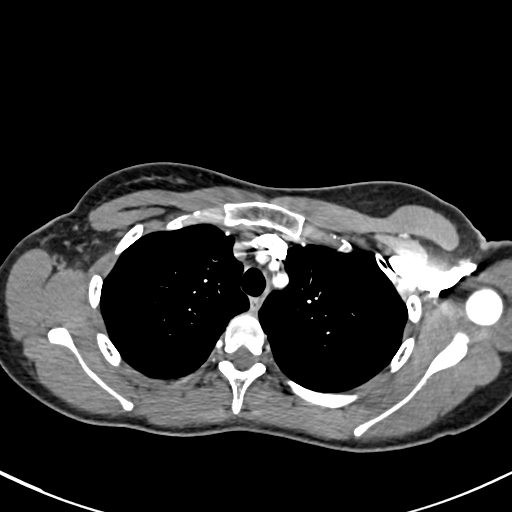
[im 224/280  lung]
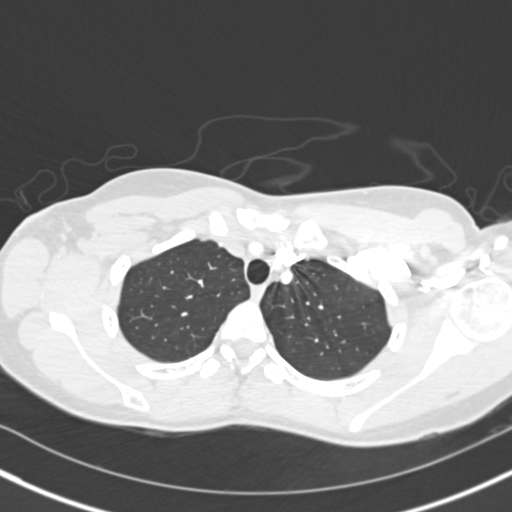
[im 238/280  mediastinal]
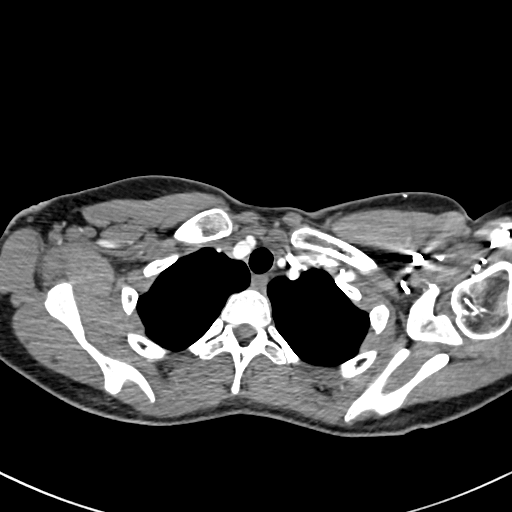
[im 252/280  lung]
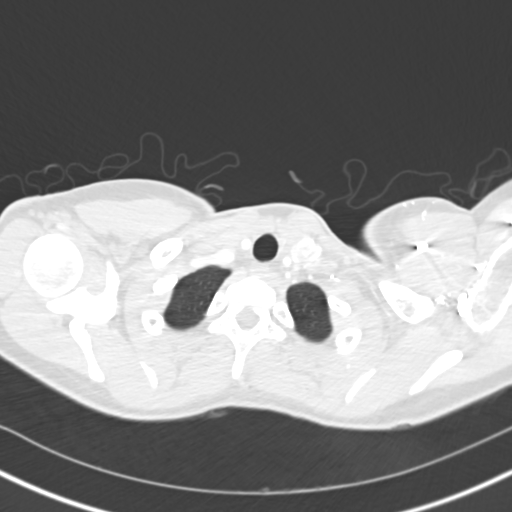
[im 266/280  mediastinal]
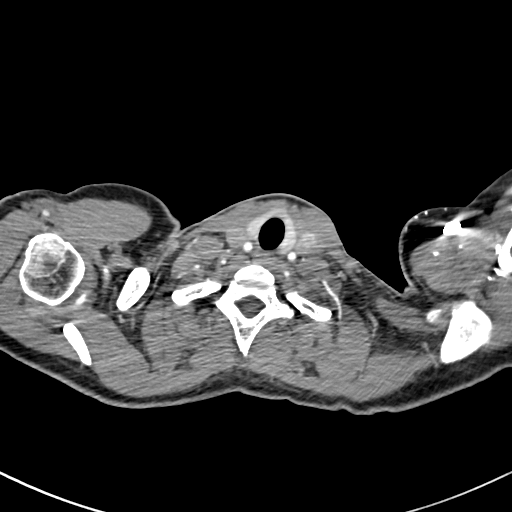

[Series 8: pe 2.0 coronal · coronal · 0.55mm/px · 1 of 102 slices shown]
[im 51/102  mediastinal]
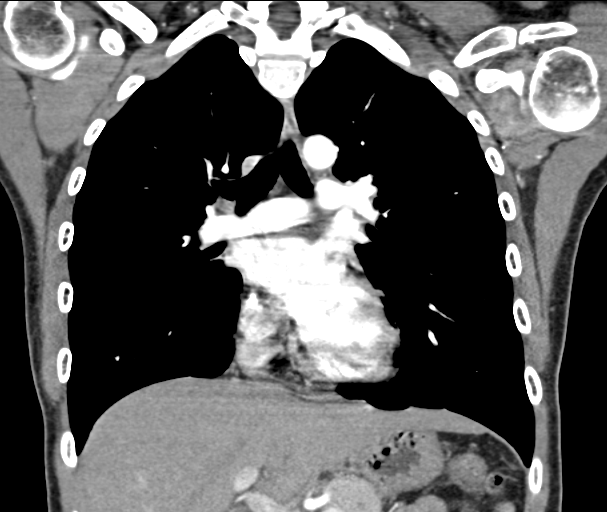

[19 of 36 positions shown; findings below may reference images not displayed]

FINDINGS: Pulmonary arterial opacification is adequate without evidence of
emboli. Heart is normal in size. There is no pleural or pericardial
effusion. A

Small amount of triangular low density soft tissue in the anterior
mediastinum likely represents residual thymus. No enlarged axillary,
mediastinal, or hilar lymph nodes are identified. A small
ground-glass nodule in the right upper lobe measures 3 mm (series 6
image 41), and there is a similar 3 mm nodule in the left lower lobe
(series 6, image 53) both of which are likely benign and
inflammatory in a patient of this age. Major airways are patent.

Visualized portion of the upper abdomen is unremarkable. No acute
osseous abnormality is seen.

Review of the MIP images confirms the above findings.
IMPRESSION: No evidence of pulmonary emboli.

## 2019-01-29 ENCOUNTER — Other Ambulatory Visit: Payer: Self-pay | Admitting: *Deleted

## 2019-01-29 DIAGNOSIS — Z20822 Contact with and (suspected) exposure to covid-19: Secondary | ICD-10-CM

## 2019-01-31 LAB — NOVEL CORONAVIRUS, NAA: SARS-CoV-2, NAA: NOT DETECTED

## 2019-06-04 ENCOUNTER — Ambulatory Visit: Payer: BLUE CROSS/BLUE SHIELD | Attending: Internal Medicine

## 2019-06-04 DIAGNOSIS — Z20822 Contact with and (suspected) exposure to covid-19: Secondary | ICD-10-CM

## 2019-06-06 LAB — NOVEL CORONAVIRUS, NAA: SARS-CoV-2, NAA: NOT DETECTED

## 2019-11-13 ENCOUNTER — Ambulatory Visit (HOSPITAL_BASED_OUTPATIENT_CLINIC_OR_DEPARTMENT_OTHER)
Admission: RE | Admit: 2019-11-13 | Discharge: 2019-11-13 | Disposition: A | Discharge status: Home / Self Care | Payer: 59 | Source: Ambulatory Visit | Attending: Family Medicine | Admitting: Family Medicine

## 2019-11-13 ENCOUNTER — Ambulatory Visit: Payer: 59 | Admitting: Family Medicine

## 2019-11-13 ENCOUNTER — Other Ambulatory Visit: Payer: Self-pay

## 2019-11-13 ENCOUNTER — Encounter: Payer: Self-pay | Admitting: Family Medicine

## 2019-11-13 VITALS — BP 113/74 | HR 80 | Ht 66.0 in | Wt 167.0 lb

## 2019-11-13 DIAGNOSIS — S92426A Nondisplaced fracture of distal phalanx of unspecified great toe, initial encounter for closed fracture: Secondary | ICD-10-CM

## 2019-11-13 DIAGNOSIS — S92424A Nondisplaced fracture of distal phalanx of right great toe, initial encounter for closed fracture: Secondary | ICD-10-CM | POA: Diagnosis not present

## 2019-11-13 DIAGNOSIS — S92426D Nondisplaced fracture of distal phalanx of unspecified great toe, subsequent encounter for fracture with routine healing: Secondary | ICD-10-CM | POA: Insufficient documentation

## 2019-11-13 NOTE — Assessment & Plan Note (Signed)
Initial injury on 6/1.  She has been in a postop shoe and has had improvement of her symptoms since that time. -Counseled on supportive care. -X-ray. -Counseled on buddy taping and using the postop shoe if symptoms flare. -Follow-up in 4 weeks.

## 2019-11-13 NOTE — Progress Notes (Signed)
Debra Pruitt - 29 y.o. female MRN [id number]  Date of birth: 27-Aug-1990  SUBJECTIVE:  Including CC & ROS.  Chief Complaint  Patient presents with  . Toe Injury    right great toe x 10-27-19    Debra Pruitt is a 29 y.o. female that is she is presenting with right great toe pain.  The injury occurred on 6/1.  She has been doing well and a postop shoe.  Denies any significant pain.  The pain and swelling have improved..  Review of the x-ray from 6/1 shows a closed nondisplaced intra-articular fracture of the great toe distal phalanx.   Review of Systems See HPI   HISTORY: Past Medical, Surgical, Social, and Family History Reviewed & Updated per EMR.   Pertinent Historical Findings include:  Past Medical History:  Diagnosis Date  . Anemia   . Anxiety   . Bacterial vaginosis   . Low back pain   . Ovarian cyst   . Peripheral neuropathy   . Vitamin D deficiency     Past Surgical History:  Procedure Laterality Date  . CESAREAN SECTION  04/30/2013  . MULTIPLE TOOTH EXTRACTIONS      Family History  Problem Relation Age of Onset  . Hypertension Mother   . Hypertension Maternal Grandmother   . Asthma Maternal Grandmother   . Hypertension Paternal Grandmother   . COPD Paternal Grandmother   . Heart disease Paternal Grandmother   . Hyperlipidemia Paternal Grandmother   . Stroke Paternal Grandmother   . Thyroid disease Mother   . Diabetes Paternal Grandmother   . Hepatitis C Paternal Grandmother   . Rheum arthritis Paternal Grandmother   . Healthy Father     Social History   Socioeconomic History  . Marital status: Single    Spouse name: Not on file  . Number of children: 1  . Years of education: Some coll  . Highest education level: Not on file  Occupational History  . Occupation: PCA  Tobacco Use  . Smoking status: Never Smoker  . Smokeless tobacco: Never Used  Substance and Sexual Activity  . Alcohol use: Yes    Alcohol/week: 0.0 standard drinks     Comment: Rare - social  . Drug use: No  . Sexual activity: Yes    Birth control/protection: Injection  Other Topics Concern  . Not on file  Social History Narrative   Lives at home with boyfriend and son.   Right-handed.   2 cups caffeine per day.   Social Determinants of Health   Financial Resource Strain:   . Difficulty of Paying Living Expenses:   Food Insecurity:   . Worried About Charity fundraiser in the Last Year:   . Arboriculturist in the Last Year:   Transportation Needs:   . Film/video editor (Medical):   Marland Kitchen Lack of Transportation (Non-Medical):   Physical Activity:   . Days of Exercise per Week:   . Minutes of Exercise per Session:   Stress:   . Feeling of Stress :   Social Connections:   . Frequency of Communication with Friends and Family:   . Frequency of Social Gatherings with Friends and Family:   . Attends Religious Services:   . Active Member of Clubs or Organizations:   . Attends Archivist Meetings:   Marland Kitchen Marital Status:   Intimate Partner Violence:   . Fear of Current or Ex-Partner:   . Emotionally Abused:   Marland Kitchen Physically Abused:   .  Sexually Abused:      PHYSICAL EXAM:  VS: BP 113/74   Pulse 80   Ht 5\' 6"  (1.676 m)   Wt 167 lb (75.8 kg)   BMI 26.95 kg/m  Physical Exam Gen: NAD, alert, cooperative with exam, well-appearing MSK:  Right foot: TTP of the distal phalanx  Limited flexion and extension of the distal phalanx  No significant swelling  Neurovascularly intact      ASSESSMENT & PLAN:   Closed nondisplaced fracture of distal phalanx of great toe, initial encounter Initial injury on 6/1.  She has been in a postop shoe and has had improvement of her symptoms since that time. -Counseled on supportive care. -X-ray. -Counseled on buddy taping and using the postop shoe if symptoms flare. -Follow-up in 4 weeks.

## 2019-11-13 NOTE — Patient Instructions (Signed)
Nice to meet you Please try buddy taping.  You can still use the hard soled shoe as needed if the toe becomes painful at the end of the day.  Please use ice as needed  I will call with the xray results from today  Please send me a message in MyChart with any questions or updates.  Please see me back in 4 weeks.   --Dr. Raeford Razor

## 2019-11-14 ENCOUNTER — Telehealth: Payer: Self-pay | Admitting: Family Medicine

## 2019-11-14 NOTE — Telephone Encounter (Signed)
Informed of results.   Rosemarie Ax, MD [facility] 11/14/2019, 9:46 AM

## 2019-12-08 ENCOUNTER — Other Ambulatory Visit: Payer: Self-pay

## 2019-12-08 ENCOUNTER — Encounter: Payer: Self-pay | Admitting: Family Medicine

## 2019-12-08 ENCOUNTER — Ambulatory Visit (INDEPENDENT_AMBULATORY_CARE_PROVIDER_SITE_OTHER): Payer: 59 | Admitting: Family Medicine

## 2019-12-08 VITALS — BP 111/61 | HR 73 | Wt 168.0 lb

## 2019-12-08 DIAGNOSIS — N644 Mastodynia: Secondary | ICD-10-CM | POA: Diagnosis not present

## 2019-12-08 NOTE — Progress Notes (Signed)
Pt states she has been having pain in both breast x 2 weeks.

## 2019-12-08 NOTE — Progress Notes (Signed)
   Subjective:    Patient ID: Debra Pruitt, female    DOB: Apr 17, 1991, 29 y.o.   MRN: [id number]  HPI Patient seen for breast pain that started a couple of weeks ago. Also had this same pain a few months ago in May. Pain is mostly around the nipple. Has a lot of nipple sensitivity. Breasts have also increased in size by a cup. Currently, her cycles are irregular - she is just stopping depo provera after being on it for 9 months. No discharge. Currently, no pain in breasts.   Review of Systems     Objective:   Physical Exam Vitals reviewed. Exam conducted with a chaperone present.  Chest:     Chest wall: No mass, lacerations, deformity, swelling, tenderness, crepitus or edema. There is no dullness to percussion.     Breasts:        Right: Normal. No swelling, bleeding, inverted nipple, mass, nipple discharge, skin change or tenderness.        Left: Normal. No swelling, bleeding, inverted nipple, mass, nipple discharge, skin change or tenderness.  Lymphadenopathy:     Upper Body:     Right upper body: No supraclavicular, axillary or pectoral adenopathy.     Left upper body: No supraclavicular, axillary or pectoral adenopathy.       Assessment & Plan:  1. Breast pain in female Likely hormonal. F/u if reoccurs.

## 2019-12-11 ENCOUNTER — Encounter: Payer: Self-pay | Admitting: Family Medicine

## 2019-12-11 ENCOUNTER — Other Ambulatory Visit: Payer: Self-pay

## 2019-12-11 ENCOUNTER — Ambulatory Visit (INDEPENDENT_AMBULATORY_CARE_PROVIDER_SITE_OTHER): Payer: 59 | Admitting: Family Medicine

## 2019-12-11 DIAGNOSIS — S92426D Nondisplaced fracture of distal phalanx of unspecified great toe, subsequent encounter for fracture with routine healing: Secondary | ICD-10-CM | POA: Diagnosis not present

## 2019-12-11 NOTE — Assessment & Plan Note (Signed)
Initial injury on 6/1.  There is a postop shoe.  Pain is improved and only minor on exam. -Counseled on home exercise therapy and supportive care. -Counseled on buddy taping. -Can follow-up as needed.

## 2019-12-11 NOTE — Progress Notes (Signed)
Debra Pruitt - 29 y.o. female MRN [id number]  Date of birth: 02/21/1991  SUBJECTIVE:  Including CC & ROS.  Chief Complaint  Patient presents with  . Follow-up    right Great Toe    Debra Pruitt is a 29 y.o. female that is following up for her toe fracture.  She has been doing well.  She denies any pain and no swelling.  Her range of motion has slowly improved.   Review of Systems See HPI   HISTORY: Past Medical, Surgical, Social, and Family History Reviewed & Updated per EMR.   Pertinent Historical Findings include:  Past Medical History:  Diagnosis Date  . Anemia   . Anxiety   . Bacterial vaginosis   . Low back pain   . Ovarian cyst   . Peripheral neuropathy   . Vitamin D deficiency     Past Surgical History:  Procedure Laterality Date  . CESAREAN SECTION  04/30/2013  . MULTIPLE TOOTH EXTRACTIONS      Family History  Problem Relation Age of Onset  . Hypertension Mother   . Thyroid disease Mother   . Healthy Father   . Hypertension Maternal Grandmother   . Asthma Maternal Grandmother   . Hypertension Paternal Grandmother   . COPD Paternal Grandmother   . Heart disease Paternal Grandmother   . Hyperlipidemia Paternal Grandmother   . Stroke Paternal Grandmother   . Diabetes Paternal Grandmother   . Hepatitis C Paternal Grandmother   . Rheum arthritis Paternal Grandmother     Social History   Socioeconomic History  . Marital status: Married    Spouse name: Not on file  . Number of children: 1  . Years of education: Some coll  . Highest education level: Not on file  Occupational History  . Occupation: PCA  Tobacco Use  . Smoking status: Never Smoker  . Smokeless tobacco: Never Used  Substance and Sexual Activity  . Alcohol use: Yes    Alcohol/week: 0.0 standard drinks    Comment: Rare - social  . Drug use: No  . Sexual activity: Yes    Birth control/protection: Injection  Other Topics Concern  . Not on file  Social History Narrative    Lives at home with boyfriend and son.   Right-handed.   2 cups caffeine per day.   Social Determinants of Health   Financial Resource Strain:   . Difficulty of Paying Living Expenses:   Food Insecurity:   . Worried About Charity fundraiser in the Last Year:   . Arboriculturist in the Last Year:   Transportation Needs:   . Film/video editor (Medical):   Marland Kitchen Lack of Transportation (Non-Medical):   Physical Activity:   . Days of Exercise per Week:   . Minutes of Exercise per Session:   Stress:   . Feeling of Stress :   Social Connections:   . Frequency of Communication with Friends and Family:   . Frequency of Social Gatherings with Friends and Family:   . Attends Religious Services:   . Active Member of Clubs or Organizations:   . Attends Archivist Meetings:   Marland Kitchen Marital Status:   Intimate Partner Violence:   . Fear of Current or Ex-Partner:   . Emotionally Abused:   Marland Kitchen Physically Abused:   . Sexually Abused:      PHYSICAL EXAM:  VS: BP 114/78   Pulse 78   Ht 5\' 5"  (1.651 m)   Wt 168 lb (  76.2 kg)   LMP 11/16/2019   BMI 27.96 kg/m  Physical Exam Gen: NAD, alert, cooperative with exam, well-appearing MSK:  Right great toe: Near normal flexion and extension. Tenderness only on significant palpation. No swelling. Neurovascular intact     ASSESSMENT & PLAN:   Closed nondisplaced fracture of distal phalanx of great toe with routine healing, subsequent encounter Initial injury on 6/1.  There is a postop shoe.  Pain is improved and only minor on exam. -Counseled on home exercise therapy and supportive care. -Counseled on buddy taping. -Can follow-up as needed.

## 2019-12-11 NOTE — Patient Instructions (Signed)
Good to see you  Please try buddy taping  Please try vitamin K2  Please send me a message in MyChart with any questions or updates.  Please see [place] back as needed.   --Dr. Raeford Razor

## 2020-01-04 ENCOUNTER — Encounter: Payer: Self-pay | Admitting: Family Medicine

## 2020-01-08 ENCOUNTER — Other Ambulatory Visit: Payer: Self-pay

## 2020-01-08 ENCOUNTER — Ambulatory Visit (INDEPENDENT_AMBULATORY_CARE_PROVIDER_SITE_OTHER): Payer: 59 | Admitting: Family Medicine

## 2020-01-08 ENCOUNTER — Encounter: Payer: Self-pay | Admitting: Neurology

## 2020-01-08 ENCOUNTER — Encounter: Payer: Self-pay | Admitting: Family Medicine

## 2020-01-08 VITALS — BP 110/80 | HR 81 | Temp 97.7°F | Resp 18 | Ht 65.0 in | Wt 168.2 lb

## 2020-01-08 DIAGNOSIS — G40909 Epilepsy, unspecified, not intractable, without status epilepticus: Secondary | ICD-10-CM | POA: Diagnosis not present

## 2020-01-08 DIAGNOSIS — Z0000 Encounter for general adult medical examination without abnormal findings: Secondary | ICD-10-CM | POA: Diagnosis not present

## 2020-01-08 DIAGNOSIS — G43909 Migraine, unspecified, not intractable, without status migrainosus: Secondary | ICD-10-CM | POA: Diagnosis not present

## 2020-01-08 DIAGNOSIS — R569 Unspecified convulsions: Secondary | ICD-10-CM

## 2020-01-08 LAB — LIPID PANEL
Cholesterol: 166 mg/dL (ref 0–200)
HDL: 60.5 mg/dL (ref >39.00)
LDL Cholesterol: 95 mg/dL (ref 0–99)
NonHDL: 105.03
Total CHOL/HDL Ratio: 3
Triglycerides: 48 mg/dL (ref 0.0–149.0)
VLDL: 9.6 mg/dL (ref 0.0–40.0)

## 2020-01-08 LAB — BASIC METABOLIC PANEL
BUN: 11 mg/dL (ref 6–23)
CO2: 30 mEq/L (ref 19–32)
Calcium: 9.8 mg/dL (ref 8.4–10.5)
Chloride: 103 mEq/L (ref 96–112)
Creatinine, Ser: 0.78 mg/dL (ref 0.40–1.20)
GFR: 103.85 mL/min (ref >60.00)
Glucose, Bld: 95 mg/dL (ref 70–99)
Potassium: 4.5 mEq/L (ref 3.5–5.1)
Sodium: 141 mEq/L (ref 135–145)

## 2020-01-08 LAB — VITAMIN D 25 HYDROXY (VIT D DEFICIENCY, FRACTURES): VITD: 19.88 ng/mL — ABNORMAL LOW (ref 30.00–100.00)

## 2020-01-08 LAB — CBC
HCT: 39.1 % (ref 36.0–46.0)
Hemoglobin: 13.2 g/dL (ref 12.0–15.0)
MCHC: 33.7 g/dL (ref 30.0–36.0)
MCV: 93.6 fl (ref 78.0–100.0)
Platelets: 189 10*3/uL (ref 150.0–400.0)
RBC: 4.18 Mil/uL (ref 3.87–5.11)
RDW: 12.4 % (ref 11.5–15.5)
WBC: 7 10*3/uL (ref 4.0–10.5)

## 2020-01-08 LAB — AST: AST: 14 U/L (ref 0–37)

## 2020-01-08 LAB — ALT: ALT: 10 U/L (ref 0–35)

## 2020-01-08 NOTE — Patient Instructions (Signed)
[facility]  [email or web address] [address], [address] [place] [address] P: [phone] F: [phone] [email or web address]  [facility] [email or web address] [address] [place],  [address] fax: [phone] [email or web address]  

## 2020-01-08 NOTE — Progress Notes (Signed)
Debra Pruitt is a 29 y.o. female  Chief Complaint  Patient presents with  . Establish Care    has not been taking medication, she has nocturnal seizures wants to discuss meds   . Annual Exam    wants full work up on labs    HPI: Debra Pruitt is a 29 y.o. female here as a new patient to establish care and for CPE, fasting labs.  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 She also has a h/o migraine headaches. Not currently on any meds. Seizure med was cost-prohibitive. Previously seen by neuro with [facility], wants to establish within [facility].  Last PAP: follows with GYN - Dr. Nehemiah Settle - but would like to establish with new provider  Diet: skips breakfast, no regular CV exercise Dental: UTD Vision: UTD  Med refills needed today? none   Past Medical History:  Diagnosis Date  . Anemia   . Anxiety   . Bacterial vaginosis   . Low back pain   . Ovarian cyst   . Peripheral neuropathy   . Vitamin D deficiency     Past Surgical History:  Procedure Laterality Date  . CESAREAN SECTION  04/30/2013  . MULTIPLE TOOTH EXTRACTIONS      Social History   Socioeconomic History  . Marital status: Married    Spouse name: Not on file  . Number of children: 1  . Years of education: Some coll  . Highest education level: Not on file  Occupational History  . Occupation: PCA  Tobacco Use  . Smoking status: Never Smoker  . Smokeless tobacco: Never Used  Substance and Sexual Activity  . Alcohol use: Yes    Alcohol/week: 0.0 standard drinks    Comment: Rare - social  . Drug use: No  . Sexual activity: Yes    Birth control/protection: Injection  Other Topics Concern  . Not on file  Social History Narrative   Lives at home with boyfriend and son.   Right-handed.   2 cups caffeine per day.   Social Determinants of Health   Financial Resource Strain:   . Difficulty of Paying Living Expenses:   Food Insecurity:     . Worried About Charity fundraiser in the Last Year:   . Arboriculturist in the Last Year:   Transportation Needs:   . Film/video editor (Medical):   Marland Kitchen Lack of Transportation (Non-Medical):   Physical Activity:   . Days of Exercise per Week:   . Minutes of Exercise per Session:   Stress:   . Feeling of Stress :   Social Connections:   . Frequency of Communication with Friends and Family:   . Frequency of Social Gatherings with Friends and Family:   . Attends Religious Services:   . Active Member of Clubs or Organizations:   . Attends Archivist Meetings:   Marland Kitchen Marital Status:   Intimate Partner Violence:   . Fear of Current or Ex-Partner:   . Emotionally Abused:   Marland Kitchen Physically Abused:   . Sexually Abused:     Family History  Problem Relation Age of Onset  . Hypertension Mother   . Thyroid disease Mother   . Healthy Father   . Hypertension Maternal Grandmother   . Asthma Maternal Grandmother   . Hypertension Paternal Grandmother   . COPD Paternal Grandmother   . Heart disease Paternal Grandmother   . Hyperlipidemia Paternal Grandmother   .  Stroke Paternal Grandmother   . Diabetes Paternal Grandmother   . Hepatitis C Paternal Grandmother   . Rheum arthritis Paternal Grandmother      Immunization History  Administered Date(s) Administered  . Influenza,inj,Quad PF,6+ Mos 04/09/2013  . Influenza-Unspecified 04/09/2013  . PPD Test 10/27/2011, 12/03/2012, 06/26/2016    Outpatient Encounter Medications as of 01/08/2020  Medication Sig Note  . [DISCONTINUED] amitriptyline (ELAVIL) 10 MG tablet TAKE 1 TABLET BY MOUTH AT BEDTIME FOR 1 WEEK. INCREASE TO 2 PILL EVERY NIGHT (Patient not taking: Reported on 01/08/2020)   . [DISCONTINUED] baclofen (LIORESAL) 10 MG tablet Take 10 mg by mouth every 6 (six) hours as needed. (Patient not taking: Reported on 01/08/2020)   . [DISCONTINUED] DULoxetine (CYMBALTA) 60 MG capsule Take 1 capsule (60 mg total) by mouth daily.  (Patient not taking: Reported on 06/29/2015)   . [DISCONTINUED] ergocalciferol (VITAMIN D2) 50000 UNITS capsule Take 50,000 Units by mouth once a week. 06/29/2015: Pt ran out has to get refill   . [DISCONTINUED] Eslicarbazepine Acetate (APTIOM) 400 MG TABS Take 1 pill daily for 2 weeks then increase to 2 pills daily (Patient not taking: Reported on 01/08/2020)   . [DISCONTINUED] HYDROcodone-acetaminophen (NORCO) 5-325 MG per tablet Take 1-2 tablets by mouth every 6 (six) hours as needed for moderate pain or severe pain. (Patient not taking: Reported on 06/29/2015)   . [DISCONTINUED] MONONESSA 0.25-35 MG-MCG tablet Take 1 tablet by mouth daily.   . [DISCONTINUED] SUMAtriptan (IMITREX) 100 MG tablet Take 1 pill at onset of HA. May repeat in 2 hours if needed but no more than 2 in 24 hours. Maximum 200 mg/day. (Patient not taking: Reported on 01/08/2020)    No facility-administered encounter medications on file as of 01/08/2020.     ROS: Gen: no fever, chills  Skin: no rash, itching ENT: no ear pain, ear drainage, nasal congestion, rhinorrhea, sinus pressure, sore throat Eyes: no blurry vision, double vision Resp: no cough, wheeze,SOB CV: no CP, palpitations, LE edema,  GI: no heartburn, n/v/d/c, abd pain GU: no dysuria, urgency, frequency, hematuria MSK: no joint pain, myalgias, back pain Neuro: as above in HPI Psych: no depression, anxiety, insomnia   Allergies  Allergen Reactions  . Mangifera Indica Swelling  . Pineapple Swelling    BP 110/80 (BP Location: Left Arm, Patient Position: Sitting, Cuff Size: Normal)   Pulse 81   Temp 97.7 F (36.5 C) (Temporal)   Resp 18   Ht 5\' 5"  (1.651 m)   Wt 168 lb 3.2 oz (76.3 kg)   LMP 12/23/2019 (Approximate)   SpO2 99%   BMI 27.99 kg/m    Wt Readings from Last 3 Encounters:  01/08/20 168 lb 3.2 oz (76.3 kg)  12/11/19 168 lb (76.2 kg)  12/08/19 168 lb (76.2 kg)    Physical Exam Constitutional:      General: She is not in acute  distress.    Appearance: She is well-developed.  HENT:     Head: Normocephalic and atraumatic.     Right Ear: Tympanic membrane and ear canal normal.     Left Ear: Tympanic membrane and ear canal normal.     Nose: Nose normal.  Eyes:     Conjunctiva/sclera: Conjunctivae normal.     Pupils: Pupils are equal, round, and reactive to light.  Neck:     Thyroid: No thyromegaly.  Cardiovascular:     Rate and Rhythm: Normal rate and regular rhythm.     Heart sounds: Normal heart sounds. No murmur  heard.   Pulmonary:     Effort: Pulmonary effort is normal. No respiratory distress.     Breath sounds: Normal breath sounds. No wheezing or rhonchi.  Abdominal:     General: Bowel sounds are normal. There is no distension.     Palpations: Abdomen is soft. There is no mass.     Tenderness: There is no abdominal tenderness.  Musculoskeletal:     Cervical back: Neck supple.  Lymphadenopathy:     Cervical: No cervical adenopathy.  Skin:    General: Skin is warm and dry.  Neurological:     Mental Status: She is alert and oriented to person, place, and time.     Motor: No abnormal muscle tone.     Coordination: Coordination normal.  Psychiatric:        Behavior: Behavior normal.      A/P:  1. Annual physical exam - discussed importance of regular CV exercise, healthy diet, adequate sleep - UTD on PAP and pt given names of OB-GYN offices to consider establishing with  - declines covid vaccine, UTD on Tdap - UTD on dental and vision exams - VITAMIN D 25 Hydroxy (Vit-D Deficiency, Fractures) - CBC - Lipid panel - Basic metabolic panel - ALT - AST - next CPE in 1 year  2. Nocturnal seizures ([place]) - Ambulatory referral to Neurology  3. Migraine without status migrainosus, not intractable, unspecified migraine type - Ambulatory referral to Neurology  This visit occurred during the SARS-CoV-2 public health emergency.  Safety protocols were in place, including screening questions prior  to the visit, additional usage of staff PPE, and extensive cleaning of exam room while observing appropriate contact time as indicated for disinfecting solutions.

## 2020-01-09 ENCOUNTER — Other Ambulatory Visit: Payer: Self-pay | Admitting: Family Medicine

## 2020-01-09 ENCOUNTER — Encounter: Payer: Self-pay | Admitting: Family Medicine

## 2020-01-09 DIAGNOSIS — E559 Vitamin D deficiency, unspecified: Secondary | ICD-10-CM | POA: Insufficient documentation

## 2020-01-09 MED ORDER — VITAMIN D (ERGOCALCIFEROL) 1.25 MG (50000 UNIT) PO CAPS: 50000.0000 [IU] | ORAL_CAPSULE | ORAL | 2 refills | Status: DC

## 2020-02-09 ENCOUNTER — Telehealth: Payer: Self-pay

## 2020-02-09 MED ORDER — MEGESTROL ACETATE 40 MG PO TABS: 40.0000 mg | ORAL_TABLET | Freq: Every day | ORAL | 5 refills | Status: DC

## 2020-02-09 NOTE — Telephone Encounter (Signed)
Patient having heavy bleeding with irregular cycles and clots. Patient states she has been having heavy bleeding since Thursday. Patient states she is having cramping with her bleeding. She has noticed at few cots about a quarter in size.  Patient states that she has been off Depo Provera for one year.   Patient scheduled in first available appointment on Sept 23rd, 2021. Will route to provider for any input before appointment in regards to bleeding. Kathrene Alu RN

## 2020-02-09 NOTE — Telephone Encounter (Signed)
We can do megace 40mg  daily for the next month to help with her bleeding.

## 2020-02-09 NOTE — Telephone Encounter (Signed)
Patient called back and made aware to start taking Megace now while she is having the heavy bleeding. Patient states understanding and instructed to follow up on Sept. 23rd as scheduled with Dr. Nehemiah Settle. Kathrene Alu RN

## 2020-02-10 ENCOUNTER — Ambulatory Visit (INDEPENDENT_AMBULATORY_CARE_PROVIDER_SITE_OTHER): Payer: 59 | Admitting: Neurology

## 2020-02-10 ENCOUNTER — Encounter: Payer: Self-pay | Admitting: Neurology

## 2020-02-10 ENCOUNTER — Other Ambulatory Visit: Payer: Self-pay

## 2020-02-10 VITALS — BP 117/76 | HR 93 | Ht 65.0 in | Wt 167.2 lb

## 2020-02-10 DIAGNOSIS — G40009 Localization-related (focal) (partial) idiopathic epilepsy and epileptic syndromes with seizures of localized onset, not intractable, without status epilepticus: Secondary | ICD-10-CM | POA: Diagnosis not present

## 2020-02-10 DIAGNOSIS — G43009 Migraine without aura, not intractable, without status migrainosus: Secondary | ICD-10-CM | POA: Diagnosis not present

## 2020-02-10 MED ORDER — ZONISAMIDE 100 MG PO CAPS: ORAL_CAPSULE | ORAL | 6 refills | Status: DC

## 2020-02-10 MED ORDER — RIZATRIPTAN BENZOATE 10 MG PO TBDP: ORAL_TABLET | ORAL | 6 refills | Status: DC

## 2020-02-10 NOTE — Progress Notes (Signed)
NEUROLOGY CONSULTATION NOTE  Debra Pruitt MRN: [id number] DOB: 1990/12/07  Referring provider: Dr. Letta Median Primary care provider: Dr. Letta Median  Reason for consult:  Seizure, headaches  Dear Dr Bryan Lemma:  Thank you for your kind referral of Debra Pruitt for consultation of the above symptoms. Although her history is well known to you, please allow me to reiterate it for the purpose of our medical record. The patient was accompanied to the clinic by her husband [name] who also provides collateral information. Records and images were personally reviewed where available.   HISTORY OF PRESENT ILLNESS: This is a 29 year old right-handed woman presenting to establish care for seizures. She had previously been seeing [facility] at [facility], records were reviewed. She was initially evaluated in April 2020 for nocturnal shaking episodes. She states that looking back, she has had unexplained episodes since childhood where she would wake up with bruises, feeling extremely tired, tongue felt weird. When younger, she would wake up with urinary incontinence. This has not occurred in the 4 years they have been together. She reported these would occur around once a month. She had an MRI brain with and without contrast in May 2020 which was normal, hippocampi symmetric. She had a 72-hour EEG in June 2020 which was normal, however typical events were not captured. She was started on Topiramate for seizure and migraine prophylaxis however this made her feel depressed/"like I was having a breakdown." She was switched to Aptiom 800mg  daily, she does not think it helped, and it was also cost-prohibitive, so she stopped it after her last visit with her prior neurologist in April 2021. Her husband has witnessed 3 nocturnal seizures this year, he would feel shaking, the last episode she was making a humming sort of noise. They only last a few seconds, he puts her hand on her and it  stops. She would be a little bit groggy, and would say she is sore then go back to sleep. She still wakes up with unexplained bruises and feeling weird, last occurrence was a couple of weeks ago. No clear triggers. After her visit with Neurology, she was asking about zoning out, and has noticed that in the past year, she would have times where she is talking then zones out, she comes back and asks what happened. For that moment of time she is not there. There is no clear warning. He states she responds when he alerts her, these do not occur frequently. She has occasional body jerks/twitches. She denies any olfactory/gustatory hallucinations, rising epigastric sensation, focal numbness/tingling/weakness. Her maternal uncle and distant cousin have seizures. Otherwise she had a normal birth and early development.  There is no history of febrile convulsions, CNS infections such as meningitis/encephalitis, significant traumatic brain injury, neurosurgical procedures.  She has had migraines since her 60s, usually starting in the frontal region then radiating diffusely. She is sensitive to lights and sounds, no nausea/vomiting. Migraines can occur every 1-2 weeks, lasting a few minutes to all day. She was tried on amitriptyline but this caused drowsiness, she stopped it in May. She was given prn Baclofen and sumatriptan but made her feel weird. Excedrin and Ibuprofen do not help. She does not think they helped with the migraines. Her mother has migraines. She reports her mood is fine. She had a fall in January. A year ago her legs gave out on her when she stood up, she could not feel her legs for a few minutes.   Diagnostic Data:  MRI brain with and without contrast in May 2020 was normal, hippocampi symmetric.  72-hour EEG in June 2020 which was normal, however typical events were not captured  PAST MEDICAL HISTORY: Past Medical History:  Diagnosis Date  . Anemia   . Anxiety   . Bacterial vaginosis   . Low  back pain   . Ovarian cyst   . Peripheral neuropathy   . Vitamin D deficiency     PAST SURGICAL HISTORY: Past Surgical History:  Procedure Laterality Date  . CESAREAN SECTION  04/30/2013  . MULTIPLE TOOTH EXTRACTIONS      MEDICATIONS: Current Outpatient Medications on File Prior to Visit  Medication Sig Dispense Refill  . Vitamin D, Ergocalciferol, (DRISDOL) 1.25 MG (50000 UNIT) CAPS capsule Take 1 capsule (50,000 Units total) by mouth every 7 (seven) days. 5 capsule 2  . megestrol (MEGACE) 40 MG tablet Take 1 tablet (40 mg total) by mouth daily. Can increase to two tablets twice a day in the event of heavy bleeding (Patient not taking: Reported on 02/10/2020) 30 tablet 5   No current facility-administered medications on file prior to visit.    ALLERGIES: Allergies  Allergen Reactions  . Mangifera Indica Swelling  . Pineapple Swelling    FAMILY HISTORY: Family History  Problem Relation Age of Onset  . Hypertension Mother   . Thyroid disease Mother   . Healthy Father   . Hypertension Maternal Grandmother   . Asthma Maternal Grandmother   . Hypertension Paternal Grandmother   . COPD Paternal Grandmother   . Heart disease Paternal Grandmother   . Hyperlipidemia Paternal Grandmother   . Stroke Paternal Grandmother   . Diabetes Paternal Grandmother   . Hepatitis C Paternal Grandmother   . Rheum arthritis Paternal Grandmother     SOCIAL HISTORY: Social History   Socioeconomic History  . Marital status: Married    Spouse name: Not on file  . Number of children: 1  . Years of education: Some coll  . Highest education level: Not on file  Occupational History  . Occupation: PCA  Tobacco Use  . Smoking status: Never Smoker  . Smokeless tobacco: Never Used  Vaping Use  . Vaping Use: Never used  Substance and Sexual Activity  . Alcohol use: Yes    Alcohol/week: 0.0 standard drinks    Comment: Rare - social  . Drug use: No  . Sexual activity: Yes    Birth  control/protection: Injection  Other Topics Concern  . Not on file  Social History Narrative   Lives at home with boyfriend and son.   Right-handed.   2 cups caffeine per day.   Social Determinants of Health   Financial Resource Strain:   . Difficulty of Paying Living Expenses: Not on file  Food Insecurity:   . Worried About Charity fundraiser in the Last Year: Not on file  . Ran Out of Food in the Last Year: Not on file  Transportation Needs:   . Lack of Transportation (Medical): Not on file  . Lack of Transportation (Non-Medical): Not on file  Physical Activity:   . Days of Exercise per Week: Not on file  . Minutes of Exercise per Session: Not on file  Stress:   . Feeling of Stress : Not on file  Social Connections:   . Frequency of Communication with Friends and Family: Not on file  . Frequency of Social Gatherings with Friends and Family: Not on file  . Attends Religious Services: Not on  file  . Active Member of Clubs or Organizations: Not on file  . Attends Archivist Meetings: Not on file  . Marital Status: Not on file  Intimate Partner Violence:   . Fear of Current or Ex-Partner: Not on file  . Emotionally Abused: Not on file  . Physically Abused: Not on file  . Sexually Abused: Not on file    PHYSICAL EXAM: Vitals:   02/10/20 1045  BP: 117/76  Pulse: 93  SpO2: 99%   General: No acute distress Head:  Normocephalic/atraumatic Skin/Extremities: No rash, no edema Neurological Exam: Mental status: alert and oriented to person, place, and time, no dysarthria or aphasia, Fund of knowledge is appropriate.  Recent and remote memory are intact.  Attention and concentration are normal.  Cranial nerves: CN I: not tested CN II: pupils equal, round and reactive to light, visual fields intact CN III, IV, VI:  full range of motion, no nystagmus, no ptosis CN V: decreased pin on left V2 CN VII: upper and lower face symmetric CN VIII: hearing intact to  conversation CN IX, X: gag intact, uvula midline CN XI: sternocleidomastoid and trapezius muscles intact CN XII: tongue midline Bulk & Tone: normal, no fasciculations. Motor: 5/5 throughout with no pronator drift. Sensation: decreased pin on left UE, otherwise intact to temperature, vibration sense. Romberg test negative Deep Tendon Reflexes: +2 throughout Cerebellar: no incoordination on finger to nose testing Gait: narrow-based and steady, able to tandem walk adequately. Tremor: none   IMPRESSION: This is a 30 year old right-handed woman with a history of recurrent nocturnal seizures since childhood, also recently noticing episodes of zoning out during the day. Her MRI brain and 72-hour EEG were normal, however typical events were not captured. Seizures suggestive of focal to bilateral tonic-clonic epilepsy, etiology unknown. She also has migraines several times a month. We discussed starting Zonisamide, which can help with both seizure and migraine prophylaxis. Side effects discussed, start Zonisamide 100mg  qhs x 2 weeks, then increase to 200mg  qhs x 2 weeks, then 300mg  qhs. She will try Rizatriptan for migraine rescue, side effects discussed. No pregnancy plans, she has had tubal ligation done.  [place] driving laws were discussed with the patient, and she knows to stop driving after a seizure, until 6 months seizure-free. They were advised to keep a calendar of her seizures and migraines, follow-up in 4 months, they know to call for any changes.   Thank you for allowing me to participate in the care of this patient. Please do not hesitate to call for any questions or concerns.   Ellouise Newer, M.D.  CC: Dr. Bryan Lemma

## 2020-02-10 NOTE — Patient Instructions (Signed)
1. Start Zonisamide 100mg : Take 1 capsule every night for 2 weeks, then increase to 2 capsules every night for 2 weeks, then increase to 3 capsules every night and continue  2. Take Rizatriptan 10mg  dissolvable tablet as needed at onset of migraine. Do not take more than 2-3 times a week  3. Keep a calendar of your seizures and migraines  4. Follow-up in 4 months, call for any changes   Seizure Precautions: 1. If medication has been prescribed for you to prevent seizures, take it exactly as directed.  Do not stop taking the medicine without talking to your doctor first, even if you have not had a seizure in a long time.   2. Avoid activities in which a seizure would cause danger to yourself or to others.  Don't operate dangerous machinery, swim alone, or climb in high or dangerous places, such as on ladders, roofs, or girders.  Do not drive unless your doctor says you may.  3. If you have any warning that you may have a seizure, lay down in a safe place where you can't hurt yourself.    4.  No driving for 6 months from last seizure, as per [facility].   Please refer to the following link on the [place] website for more information: http://www.epilepsyfoundation.org/answerplace/Social/driving/drivingu.cfm   5.  Maintain good sleep hygiene. Avoid alcohol.  6.  Contact your doctor if you have any problems that may be related to the medicine you are taking.  7.  Call 911 and bring the patient back to the ED if:        A.  The seizure lasts longer than 5 minutes.       B.  The patient doesn't awaken shortly after the seizure  C.  The patient has new problems such as difficulty seeing, speaking or moving  D.  The patient was injured during the seizure  E.  The patient has a temperature over 102 F (39C)  F.  The patient vomited and now is having trouble breathing

## 2020-02-19 ENCOUNTER — Ambulatory Visit: Payer: 59 | Admitting: Family Medicine

## 2020-02-23 ENCOUNTER — Encounter: Payer: Self-pay | Admitting: Family Medicine

## 2020-02-23 ENCOUNTER — Ambulatory Visit: Payer: 59 | Admitting: Family Medicine

## 2020-02-23 ENCOUNTER — Ambulatory Visit: Payer: Self-pay

## 2020-02-23 ENCOUNTER — Ambulatory Visit (INDEPENDENT_AMBULATORY_CARE_PROVIDER_SITE_OTHER): Payer: 59 | Admitting: Family Medicine

## 2020-02-23 ENCOUNTER — Other Ambulatory Visit: Payer: Self-pay

## 2020-02-23 VITALS — BP 109/74 | HR 77 | Ht 65.0 in | Wt 168.0 lb

## 2020-02-23 DIAGNOSIS — M79674 Pain in right toe(s): Secondary | ICD-10-CM

## 2020-02-23 DIAGNOSIS — M7751 Other enthesopathy of right foot: Secondary | ICD-10-CM | POA: Diagnosis not present

## 2020-02-23 MED ORDER — PENNSAID 2 % EX SOLN: 1.0000 "application " | Freq: Two times a day (BID) | CUTANEOUS | 2 refills | Status: DC

## 2020-02-23 NOTE — Assessment & Plan Note (Signed)
Likely having stiffness related to immobilization with a fracture a few months ago. -Counseled on supportive care. -Pennsaid. -Could consider injection.

## 2020-02-23 NOTE — Progress Notes (Signed)
Debra Pruitt - 29 y.o. female MRN [id number]  Date of birth: 11-04-1990  SUBJECTIVE:  Including CC & ROS.  Chief Complaint  Patient presents with  . Toe Pain    right great toe x 2 weeks    Debra Pruitt is a 29 y.o. female that is presenting with right first MTP joint pain.  Has been ongoing for a few weeks.  It seems worse when her toes and extension.  No injury or inciting event.  It occurred after she had a broken great toe earlier in the summer.   Review of Systems See HPI   HISTORY: Past Medical, Surgical, Social, and Family History Reviewed & Updated per EMR.   Pertinent Historical Findings include:  Past Medical History:  Diagnosis Date  . Anemia   . Anxiety   . Bacterial vaginosis   . Low back pain   . Ovarian cyst   . Peripheral neuropathy   . Vitamin D deficiency     Past Surgical History:  Procedure Laterality Date  . CESAREAN SECTION  04/30/2013  . MULTIPLE TOOTH EXTRACTIONS      Family History  Problem Relation Age of Onset  . Hypertension Mother   . Thyroid disease Mother   . Healthy Father   . Hypertension Maternal Grandmother   . Asthma Maternal Grandmother   . Hypertension Paternal Grandmother   . COPD Paternal Grandmother   . Heart disease Paternal Grandmother   . Hyperlipidemia Paternal Grandmother   . Stroke Paternal Grandmother   . Diabetes Paternal Grandmother   . Hepatitis C Paternal Grandmother   . Rheum arthritis Paternal Grandmother     Social History   Socioeconomic History  . Marital status: Married    Spouse name: Not on file  . Number of children: 1  . Years of education: Some coll  . Highest education level: Not on file  Occupational History  . Occupation: PCA  Tobacco Use  . Smoking status: Never Smoker  . Smokeless tobacco: Never Used  Vaping Use  . Vaping Use: Never used  Substance and Sexual Activity  . Alcohol use: Yes    Alcohol/week: 0.0 standard drinks    Comment: Rare - social  . Drug use: No  .  Sexual activity: Yes    Birth control/protection: Injection  Other Topics Concern  . Not on file  Social History Narrative   Lives at home with boyfriend and son.   Right-handed.   2 cups caffeine per day.   Social Determinants of Health   Financial Resource Strain:   . Difficulty of Paying Living Expenses: Not on file  Food Insecurity:   . Worried About Charity fundraiser in the Last Year: Not on file  . Ran Out of Food in the Last Year: Not on file  Transportation Needs:   . Lack of Transportation (Medical): Not on file  . Lack of Transportation (Non-Medical): Not on file  Physical Activity:   . Days of Exercise per Week: Not on file  . Minutes of Exercise per Session: Not on file  Stress:   . Feeling of Stress : Not on file  Social Connections:   . Frequency of Communication with Friends and Family: Not on file  . Frequency of Social Gatherings with Friends and Family: Not on file  . Attends Religious Services: Not on file  . Active Member of Clubs or Organizations: Not on file  . Attends Archivist Meetings: Not on file  . Marital Status:  Not on file  Intimate Partner Violence:   . Fear of Current or Ex-Partner: Not on file  . Emotionally Abused: Not on file  . Physically Abused: Not on file  . Sexually Abused: Not on file     PHYSICAL EXAM:  VS: BP 109/74   Pulse 77   Ht 5\' 5"  (1.651 m)   Wt 168 lb (76.2 kg)   BMI 27.96 kg/m  Physical Exam Gen: NAD, alert, cooperative with exam, well-appearing MSK:  Right great toe: Pain with traction on the toe. No obvious swelling or redness. Some bossing of the first MTP joint. Neurovascularly intact  Limited ultrasound: First MTP joint on the right:  No effusion. No hyperemia. Some stiffness on dynamic testing when compared to the contralateral side.  Summary: Findings suggest capsulitis of the first MTP joint.  Ultrasound and interpretation by Clearance Coots, MD    ASSESSMENT & PLAN:    Capsulitis of toe of right foot Likely having stiffness related to immobilization with a fracture a few months ago. -Counseled on supportive care. -Pennsaid. -Could consider injection.

## 2020-02-23 NOTE — Patient Instructions (Signed)
Good to see you Please try heat  Please try the massage and mobilize the area   Please try to wear a supportive shoe Please send me a message in MyChart with any questions or updates.  Please see me back in 3-4 weeks if no improvement .   --Dr. Raeford Razor

## 2020-03-04 ENCOUNTER — Ambulatory Visit: Payer: 59 | Admitting: Family Medicine

## 2020-03-26 ENCOUNTER — Encounter: Payer: Self-pay | Admitting: Nurse Practitioner

## 2020-03-26 ENCOUNTER — Telehealth (INDEPENDENT_AMBULATORY_CARE_PROVIDER_SITE_OTHER): Payer: 59 | Admitting: Nurse Practitioner

## 2020-03-26 VITALS — BP 117/80 | HR 83 | Temp 97.7°F | Ht 65.0 in | Wt 166.2 lb

## 2020-03-26 DIAGNOSIS — J0140 Acute pansinusitis, unspecified: Secondary | ICD-10-CM | POA: Diagnosis not present

## 2020-03-26 MED ORDER — AZITHROMYCIN 250 MG PO TABS: 250.0000 mg | ORAL_TABLET | Freq: Every day | ORAL | 0 refills | Status: DC

## 2020-03-26 MED ORDER — SALINE SPRAY 0.65 % NA SOLN: 1.0000 | NASAL | 0 refills | Status: DC | PRN

## 2020-03-26 MED ORDER — FLUTICASONE PROPIONATE 50 MCG/ACT NA SUSP: 2.0000 | Freq: Every day | NASAL | 0 refills | Status: DC

## 2020-03-26 NOTE — Progress Notes (Signed)
Virtual Visit via Video Note  I connected with@ on 03/26/20 at 11:00 AM EDT by a video enabled telemedicine application and verified that I am speaking with the correct person using two identifiers.  Location: Patient:Home Provider: Office Participants: patient and provider  I discussed the limitations of evaluation and management by telemedicine and the availability of in person appointments. I also discussed with the patient that there may be a patient responsible charge related to this service. The patient expressed understanding and agreed to proceed.  CC:Pt c/o runny nose, congestion and fever x1 week. Pt has tried otc medication (zyrtec d ) and states this helped getting the yellow mucus out. Pt did a home COVID test and it came back negative.   History of Present Illness: Sinusitis This is a new problem. The current episode started 1 to 4 weeks ago. The problem has been gradually worsening since onset. There has been no fever. The pain is severe. Associated symptoms include chills, congestion, coughing, ear pain, headaches, sinus pressure and a sore throat. Pertinent negatives include no diaphoresis, hoarse voice, neck pain, shortness of breath, sneezing or swollen glands. Past treatments include oral decongestants. The treatment provided no relief.   Observations/Objective: Physical Exam Vitals reviewed.  Constitutional:      General: She is not in acute distress. Cardiovascular:     Rate and Rhythm: Normal rate.     Pulses: Normal pulses.  Pulmonary:     Effort: Pulmonary effort is normal.  Neurological:     Mental Status: She is alert and oriented to person, place, and time.    Assessment and Plan: Keondria was seen today for acute visit.  Diagnoses and all orders for this visit:  Acute non-recurrent pansinusitis -     azithromycin (ZITHROMAX Z-PAK) 250 MG tablet; Take 1 tablet (250 mg total) by mouth daily. Take 2tabs on first day, then 1tab once a day till  complete -     fluticasone (FLONASE) 50 MCG/ACT nasal spray; Place 2 sprays into both nostrils daily. -     sodium chloride (OCEAN) 0.65 % SOLN nasal spray; Place 1 spray into both nostrils as needed for congestion.   Follow Up Instructions: Stop oral decongestant   I discussed the assessment and treatment plan with the patient. The patient was provided an opportunity to ask questions and all were answered. The patient agreed with the plan and demonstrated an understanding of the instructions.   The patient was advised to call back or seek an in-person evaluation if the symptoms worsen or if the condition fails to improve as anticipated.  Wilfred Lacy, NP

## 2020-03-26 NOTE — Patient Instructions (Signed)
Stop oral decongestant Use plain zyrtec if needed

## 2020-05-17 ENCOUNTER — Other Ambulatory Visit: Payer: Self-pay

## 2020-05-17 ENCOUNTER — Encounter: Payer: Self-pay | Admitting: Family

## 2020-05-17 ENCOUNTER — Ambulatory Visit (INDEPENDENT_AMBULATORY_CARE_PROVIDER_SITE_OTHER): Payer: 59 | Admitting: Family

## 2020-05-17 VITALS — BP 118/64 | HR 90 | Temp 96.9°F | Ht 65.0 in | Wt 168.8 lb

## 2020-05-17 DIAGNOSIS — B9689 Other specified bacterial agents as the cause of diseases classified elsewhere: Secondary | ICD-10-CM | POA: Diagnosis not present

## 2020-05-17 DIAGNOSIS — N760 Acute vaginitis: Secondary | ICD-10-CM | POA: Diagnosis not present

## 2020-05-17 DIAGNOSIS — N898 Other specified noninflammatory disorders of vagina: Secondary | ICD-10-CM

## 2020-05-17 MED ORDER — METRONIDAZOLE 500 MG PO TABS: 500.0000 mg | ORAL_TABLET | Freq: Two times a day (BID) | ORAL | 0 refills | Status: DC

## 2020-05-17 MED ORDER — FLUCONAZOLE 150 MG PO TABS: 150.0000 mg | ORAL_TABLET | ORAL | 0 refills | Status: DC

## 2020-05-17 NOTE — Patient Instructions (Signed)

## 2020-05-17 NOTE — Progress Notes (Signed)
Acute Office Visit  Subjective:    Patient ID: Debra Pruitt, female    DOB: May 31, 1990, 31 y.o.   MRN: [id number]  Chief Complaint  Patient presents with  . Vaginitis    Vaginal irritation x 1 week seems to be worse , little discharge.     HPI Patient is in today with c/o vaginal irritation and white discharge x 1 week. She attempted boric acid suppository that did not help. She is sexually active with 1 female partner. No bleeding or odor  Past Medical History:  Diagnosis Date  . Anemia   . Anxiety   . Bacterial vaginosis   . Low back pain   . Ovarian cyst   . Peripheral neuropathy   . Vitamin D deficiency     Past Surgical History:  Procedure Laterality Date  . CESAREAN SECTION  04/30/2013  . MULTIPLE TOOTH EXTRACTIONS      Family History  Problem Relation Age of Onset  . Hypertension Mother   . Thyroid disease Mother   . Healthy Father   . Hypertension Maternal Grandmother   . Asthma Maternal Grandmother   . Hypertension Paternal Grandmother   . COPD Paternal Grandmother   . Heart disease Paternal Grandmother   . Hyperlipidemia Paternal Grandmother   . Stroke Paternal Grandmother   . Diabetes Paternal Grandmother   . Hepatitis C Paternal Grandmother   . Rheum arthritis Paternal Grandmother     Social History   Socioeconomic History  . Marital status: Married    Spouse name: Not on file  . Number of children: 1  . Years of education: Some coll  . Highest education level: Not on file  Occupational History  . Occupation: PCA  Tobacco Use  . Smoking status: Never Smoker  . Smokeless tobacco: Never Used  Vaping Use  . Vaping Use: Never used  Substance and Sexual Activity  . Alcohol use: Yes    Alcohol/week: 0.0 standard drinks    Comment: Rare - social  . Drug use: No  . Sexual activity: Yes    Birth control/protection: Injection  Other Topics Concern  . Not on file  Social History Narrative   Lives at home with boyfriend and son.    Right-handed.   2 cups caffeine per day.   Social Determinants of Health   Financial Resource Strain: Not on file  Food Insecurity: Not on file  Transportation Needs: Not on file  Physical Activity: Not on file  Stress: Not on file  Social Connections: Not on file  Intimate Partner Violence: Not on file    Outpatient Medications Prior to Visit  Medication Sig Dispense Refill  . rizatriptan (MAXALT-MLT) 10 MG disintegrating tablet Take 1 tablet at onset of migraine. Do not take more than 2-3 a week 10 tablet 6  . Vitamin D, Ergocalciferol, (DRISDOL) 1.25 MG (50000 UNIT) CAPS capsule Take 1 capsule (50,000 Units total) by mouth every 7 (seven) days. (Patient not taking: Reported on 05/17/2020) 5 capsule 2  . azithromycin (ZITHROMAX Z-PAK) 250 MG tablet Take 1 tablet (250 mg total) by mouth daily. Take 2tabs on first day, then 1tab once a day till complete (Patient not taking: Reported on 05/17/2020) 6 tablet 0  . Diclofenac Sodium (PENNSAID) 2 % SOLN Place 1 application onto the skin 2 (two) times daily. (Patient not taking: Reported on 05/17/2020) 112 g 2  . fluticasone (FLONASE) 50 MCG/ACT nasal spray Place 2 sprays into both nostrils daily. (Patient not taking: Reported on 05/17/2020)  16 g 0  . megestrol (MEGACE) 40 MG tablet Take 1 tablet (40 mg total) by mouth daily. Can increase to two tablets twice a day in the event of heavy bleeding (Patient not taking: No sig reported) 30 tablet 5  . promethazine-dextromethorphan (PROMETHAZINE-DM) 6.25-15 MG/5ML syrup Take by mouth. (Patient not taking: No sig reported)    . sodium chloride (OCEAN) 0.65 % SOLN nasal spray Place 1 spray into both nostrils as needed for congestion. (Patient not taking: Reported on 05/17/2020) 15 mL 0  . zonisamide (ZONEGRAN) 100 MG capsule Take 1 capsule every night for 2 weeks, then increase to 2 capsules every night for 2 weeks, then increase to 3 capsules every night and continue (Patient not taking: Reported on  05/17/2020) 90 capsule 6   No facility-administered medications prior to visit.    Allergies  Allergen Reactions  . Mangifera Indica Swelling  . Pineapple Swelling    Review of Systems  Constitutional: Negative.   Respiratory: Negative.   Cardiovascular: Negative.   Genitourinary: Positive for vaginal discharge. Negative for pelvic pain, urgency and vaginal pain.       Vaginal irritation  Allergic/Immunologic: Negative.   All other systems reviewed and are negative.      Objective:    Physical Exam Constitutional:      Appearance: Normal appearance. She is normal weight.  Pulmonary:     Effort: Pulmonary effort is normal.     Breath sounds: Normal breath sounds.  Abdominal:     General: Abdomen is flat.     Palpations: Abdomen is soft.  Genitourinary:    General: Normal vulva.     Rectum: Normal.     Comments: Frothy vaginal discharge  Skin:    General: Skin is warm and dry.  Neurological:     Mental Status: She is alert.  Psychiatric:        Mood and Affect: Mood normal.        Behavior: Behavior normal.     BP 118/64   Pulse 90   Temp (!) 96.9 F (36.1 C) (Temporal)   Ht 5\' 5"  (1.651 m)   Wt 168 lb 12.8 oz (76.6 kg)   LMP 04/26/2020 (Approximate)   SpO2 99%   BMI 28.09 kg/m  Wt Readings from Last 3 Encounters:  05/17/20 168 lb 12.8 oz (76.6 kg)  03/26/20 166 lb 3.2 oz (75.4 kg)  02/23/20 168 lb (76.2 kg)    Health Maintenance Due  Topic Date Due  . Hepatitis C Screening  Never done  . HIV Screening  Never done  . TETANUS/TDAP  Never done  . PAP SMEAR-Modifier  Never done    There are no preventive care reminders to display for this patient.   No results found for: TSH Lab Results  Component Value Date   WBC 7.0 01/08/2020   HGB 13.2 01/08/2020   HCT 39.1 01/08/2020   MCV 93.6 01/08/2020   PLT 189.0 01/08/2020   Lab Results  Component Value Date   NA 141 01/08/2020   K 4.5 01/08/2020   CO2 30 01/08/2020   GLUCOSE 95 01/08/2020    BUN 11 01/08/2020   CREATININE 0.78 01/08/2020   AST 14 01/08/2020   ALT 10 01/08/2020   CALCIUM 9.8 01/08/2020   ANIONGAP 8 06/30/2015   GFR 103.85 01/08/2020   Lab Results  Component Value Date   CHOL 166 01/08/2020   Lab Results  Component Value Date   HDL 60.50 01/08/2020   Lab  Results  Component Value Date   LDLCALC 95 01/08/2020   Lab Results  Component Value Date   TRIG 48.0 01/08/2020   Lab Results  Component Value Date   CHOLHDL 3 01/08/2020   No results found for: HGBA1C     Assessment & Plan:   Problem List Items Addressed This Visit   None   Visit Diagnoses    Vaginal discharge    -  Primary   Relevant Orders   Wet prep, genital   Vaginal irritation       Bacterial vaginosis       Relevant Medications   metroNIDAZOLE (FLAGYL) 500 MG tablet   fluconazole (DIFLUCAN) 150 MG tablet       Meds ordered this encounter  Medications  . metroNIDAZOLE (FLAGYL) 500 MG tablet    Sig: Take 1 tablet (500 mg total) by mouth 2 (two) times daily.    Dispense:  14 tablet    Refill:  0  . fluconazole (DIFLUCAN) 150 MG tablet    Sig: Take 1 tablet (150 mg total) by mouth once a week. May repeat in 1 week    Dispense:  2 tablet    Refill:  0   Call the office with any questions or concerns. Recheck as needed  Kennyth Arnold, FNP

## 2020-05-18 ENCOUNTER — Other Ambulatory Visit (HOSPITAL_COMMUNITY)
Admission: RE | Admit: 2020-05-18 | Discharge: 2020-05-18 | Disposition: A | Discharge status: Home / Self Care | Payer: 59 | Source: Ambulatory Visit | Attending: Family | Admitting: Family

## 2020-05-18 DIAGNOSIS — N898 Other specified noninflammatory disorders of vagina: Secondary | ICD-10-CM | POA: Insufficient documentation

## 2020-05-18 NOTE — Addendum Note (Signed)
Addended by: Lynnea Ferrier on: 05/18/2020 07:34 AM   Modules accepted: Orders

## 2020-05-19 ENCOUNTER — Telehealth: Payer: Self-pay | Admitting: Family Medicine

## 2020-05-19 NOTE — Telephone Encounter (Signed)
patient notified that no results have been put in the chart yet. Will call when they have been reviewed.  Dm/cma

## 2020-05-19 NOTE — Telephone Encounter (Signed)
Patient is calling regarding a swab that she had done at her visit. Please give her a call back at [phone].

## 2020-05-21 ENCOUNTER — Encounter: Payer: Self-pay | Admitting: Family Medicine

## 2020-05-24 ENCOUNTER — Other Ambulatory Visit (HOSPITAL_COMMUNITY)
Admission: RE | Admit: 2020-05-24 | Discharge: 2020-05-24 | Disposition: A | Discharge status: Home / Self Care | Payer: 59 | Source: Ambulatory Visit | Attending: Family | Admitting: Family

## 2020-05-24 ENCOUNTER — Ambulatory Visit (INDEPENDENT_AMBULATORY_CARE_PROVIDER_SITE_OTHER): Payer: 59 | Admitting: Family

## 2020-05-24 ENCOUNTER — Encounter: Payer: Self-pay | Admitting: Family

## 2020-05-24 ENCOUNTER — Other Ambulatory Visit: Payer: Self-pay

## 2020-05-24 VITALS — BP 116/70 | HR 76 | Temp 97.3°F | Ht 65.0 in | Wt 169.4 lb

## 2020-05-24 DIAGNOSIS — N898 Other specified noninflammatory disorders of vagina: Secondary | ICD-10-CM

## 2020-05-24 DIAGNOSIS — B373 Candidiasis of vulva and vagina: Secondary | ICD-10-CM | POA: Insufficient documentation

## 2020-05-24 DIAGNOSIS — B3731 Acute candidiasis of vulva and vagina: Secondary | ICD-10-CM

## 2020-05-24 NOTE — Telephone Encounter (Signed)
Please see message and advise.  Thank you. ° °

## 2020-05-24 NOTE — Patient Instructions (Signed)
Vaginal Yeast Infection, Adult  Vaginal yeast infection is a condition that causes vaginal discharge as well as soreness, swelling, and redness (inflammation) of the vagina. This is a common condition. Some women get this infection frequently. What are the causes? This condition is caused by a change in the normal balance of the yeast (candida) and bacteria that live in the vagina. This change causes an overgrowth of yeast, which causes the inflammation. What increases the risk? The condition is more likely to develop in women who:  Take antibiotic medicines.  Have diabetes.  Take birth control pills.  Are pregnant.  Douche often.  Have a weak body defense system (immune system).  Have been taking steroid medicines for a long time.  Frequently wear tight clothing. What are the signs or symptoms? Symptoms of this condition include:  White, thick, creamy vaginal discharge.  Swelling, itching, redness, and irritation of the vagina. The lips of the vagina (vulva) may be affected as well.  Pain or a burning feeling while urinating.  Pain during sex. How is this diagnosed? This condition is diagnosed based on:  Your medical history.  A physical exam.  A pelvic exam. Your health care provider will examine a sample of your vaginal discharge under a microscope. Your health care provider may send this sample for testing to confirm the diagnosis. How is this treated? This condition is treated with medicine. Medicines may be over-the-counter or prescription. You may be told to use one or more of the following:  Medicine that is taken by mouth (orally).  Medicine that is applied as a cream (topically).  Medicine that is inserted directly into the vagina (suppository). Follow these instructions at home:  Lifestyle  Do not have sex until your health care provider approves. Tell your sex partner that you have a yeast infection. That person should go to his or her health care  provider and ask if they should also be treated.  Do not wear tight clothes, such as pantyhose or tight pants.  Wear breathable cotton underwear. General instructions  Take or apply over-the-counter and prescription medicines only as told by your health care provider.  Eat more yogurt. This may help to keep your yeast infection from returning.  Do not use tampons until your health care provider approves.  Try taking a sitz bath to help with discomfort. This is a warm water bath that is taken while you are sitting down. The water should only come up to your hips and should cover your buttocks. Do this 3-4 times per day or as told by your health care provider.  Do not douche.  If you have diabetes, keep your blood sugar levels under control.  Keep all follow-up visits as told by your health care provider. This is important. Contact a health care provider if:  You have a fever.  Your symptoms go away and then return.  Your symptoms do not get better with treatment.  Your symptoms get worse.  You have new symptoms.  You develop blisters in or around your vagina.  You have blood coming from your vagina and it is not your menstrual period.  You develop pain in your abdomen. Summary  Vaginal yeast infection is a condition that causes discharge as well as soreness, swelling, and redness (inflammation) of the vagina.  This condition is treated with medicine. Medicines may be over-the-counter or prescription.  Take or apply over-the-counter and prescription medicines only as told by your health care provider.  Do not douche.  Do not have sex or use tampons until your health care provider approves.  Contact a health care provider if your symptoms do not get better with treatment or your symptoms go away and then return. This information is not intended to replace advice given to you by your health care provider. Make sure you discuss any questions you have with your health care  provider. Document Revised: 12/13/2018 Document Reviewed: 10/01/2017 Elsevier Patient Education  [place].

## 2020-05-24 NOTE — Progress Notes (Signed)
Acute Office Visit  Subjective:    Patient ID: Debra Pruitt, female    DOB: [birth year], 29 y.o.   MRN: [id number]  Chief Complaint  Patient presents with  . Dysuria    Discharge, dysuria, possible reaction to antibiotics.     HPI Patient is in today with external vaginal irritation and burning.  She just completed Flagyl for BV. Discharge is white and clumpy. No abdominal pain or back pain  Past Medical History:  Diagnosis Date  . Anemia   . Anxiety   . Bacterial vaginosis   . Low back pain   . Ovarian cyst   . Peripheral neuropathy   . Vitamin D deficiency     Past Surgical History:  Procedure Laterality Date  . CESAREAN SECTION  04/30/2013  . MULTIPLE TOOTH EXTRACTIONS      Family History  Problem Relation Age of Onset  . Hypertension Mother   . Thyroid disease Mother   . Healthy Father   . Hypertension Maternal Grandmother   . Asthma Maternal Grandmother   . Hypertension Paternal Grandmother   . COPD Paternal Grandmother   . Heart disease Paternal Grandmother   . Hyperlipidemia Paternal Grandmother   . Stroke Paternal Grandmother   . Diabetes Paternal Grandmother   . Hepatitis C Paternal Grandmother   . Rheum arthritis Paternal Grandmother     Social History   Socioeconomic History  . Marital status: Married    Spouse name: Not on file  . Number of children: 1  . Years of education: Some coll  . Highest education level: Not on file  Occupational History  . Occupation: PCA  Tobacco Use  . Smoking status: Never Smoker  . Smokeless tobacco: Never Used  Vaping Use  . Vaping Use: Never used  Substance and Sexual Activity  . Alcohol use: Yes    Alcohol/week: 0.0 standard drinks    Comment: Rare - social  . Drug use: No  . Sexual activity: Yes    Birth control/protection: Injection  Other Topics Concern  . Not on file  Social History Narrative   Lives at home with boyfriend and son.   Right-handed.   2 cups caffeine per day.   Social  Determinants of Health   Financial Resource Strain: Not on file  Food Insecurity: Not on file  Transportation Needs: Not on file  Physical Activity: Not on file  Stress: Not on file  Social Connections: Not on file  Intimate Partner Violence: Not on file    Outpatient Medications Prior to Visit  Medication Sig Dispense Refill  . fluconazole (DIFLUCAN) 150 MG tablet Take 1 tablet (150 mg total) by mouth once a week. May repeat in 1 week 2 tablet 0  . metroNIDAZOLE (FLAGYL) 500 MG tablet Take 1 tablet (500 mg total) by mouth 2 (two) times daily. 14 tablet 0  . rizatriptan (MAXALT-MLT) 10 MG disintegrating tablet Take 1 tablet at onset of migraine. Do not take more than 2-3 a week 10 tablet 6  . Vitamin D, Ergocalciferol, (DRISDOL) 1.25 MG (50000 UNIT) CAPS capsule Take 1 capsule (50,000 Units total) by mouth every 7 (seven) days. (Patient not taking: No sig reported) 5 capsule 2   No facility-administered medications prior to visit.    Allergies  Allergen Reactions  . Mangifera Indica Swelling  . Pineapple Swelling    Review of Systems  Constitutional: Negative.   Respiratory: Negative.   Cardiovascular: Negative.   Gastrointestinal: Negative.  Negative for abdominal pain.  Genitourinary: Positive for dysuria, vaginal discharge and vaginal pain. Negative for genital sores, menstrual problem and urgency.  Allergic/Immunologic: Negative.   Psychiatric/Behavioral: Negative.        Objective:    Physical Exam Constitutional:      Appearance: Normal appearance. She is normal weight.  Cardiovascular:     Rate and Rhythm: Normal rate and regular rhythm.     Pulses: Normal pulses.     Heart sounds: Normal heart sounds.  Pulmonary:     Effort: Pulmonary effort is normal.     Breath sounds: Normal breath sounds.  Abdominal:     General: Abdomen is flat. Bowel sounds are normal.     Palpations: Abdomen is soft.  Genitourinary:    General: Normal vulva.     Vagina: Vaginal  discharge present.     Comments: Thick, white vaginal discharge with external redness and irritation  Musculoskeletal:     Cervical back: Normal range of motion and neck supple.  Skin:    General: Skin is warm and dry.  Neurological:     General: No focal deficit present.     Mental Status: She is alert and oriented to person, place, and time.  Psychiatric:        Mood and Affect: Mood normal.        Behavior: Behavior normal.     BP 116/70   Pulse 76   Temp (!) 97.3 F (36.3 C) (Temporal)   Ht 5\' 5"  (1.651 m)   Wt 169 lb 6.4 oz (76.8 kg)   LMP 04/26/2020 (Approximate)   BMI 28.19 kg/m  Wt Readings from Last 3 Encounters:  05/24/20 169 lb 6.4 oz (76.8 kg)  05/17/20 168 lb 12.8 oz (76.6 kg)  03/26/20 166 lb 3.2 oz (75.4 kg)    Health Maintenance Due  Topic Date Due  . Hepatitis C Screening  Never done  . HIV Screening  Never done  . TETANUS/TDAP  Never done  . PAP SMEAR-Modifier  Never done    There are no preventive care reminders to display for this patient.   No results found for: TSH Lab Results  Component Value Date   WBC 7.0 01/08/2020   HGB 13.2 01/08/2020   HCT 39.1 01/08/2020   MCV 93.6 01/08/2020   PLT 189.0 01/08/2020   Lab Results  Component Value Date   NA 141 01/08/2020   K 4.5 01/08/2020   CO2 30 01/08/2020   GLUCOSE 95 01/08/2020   BUN 11 01/08/2020   CREATININE 0.78 01/08/2020   AST 14 01/08/2020   ALT 10 01/08/2020   CALCIUM 9.8 01/08/2020   ANIONGAP 8 06/30/2015   GFR 103.85 01/08/2020   Lab Results  Component Value Date   CHOL 166 01/08/2020   Lab Results  Component Value Date   HDL 60.50 01/08/2020   Lab Results  Component Value Date   LDLCALC 95 01/08/2020   Lab Results  Component Value Date   TRIG 48.0 01/08/2020   Lab Results  Component Value Date   CHOLHDL 3 01/08/2020   No results found for: HGBA1C     Assessment & Plan:   Problem List Items Addressed This Visit   None   Visit Diagnoses    Vaginal  discharge    -  Primary   Relevant Orders   Wet prep, genital   Cervicovaginal ancillary only( [place])   Vaginal candida       Relevant Orders   Cervicovaginal ancillary only( [place])  Apply monistat externally to help soothe the external genitalia. Take Diflucan 1 pill x 1 today. Call if no better in 48 hours    Kennyth Arnold, FNP

## 2020-05-25 LAB — CERVICOVAGINAL ANCILLARY ONLY
Chlamydia: NEGATIVE
Comment: NEGATIVE
Comment: NEGATIVE
Comment: NEGATIVE
Comment: NORMAL
HSV1: NEGATIVE
HSV2: NEGATIVE
Neisseria Gonorrhea: NEGATIVE
Trichomonas: NEGATIVE

## 2020-05-26 LAB — CERVICOVAGINAL ANCILLARY ONLY
Bacterial Vaginitis (gardnerella): NEGATIVE
Candida Glabrata: NEGATIVE
Candida Vaginitis: POSITIVE — AB
Chlamydia: NEGATIVE
Comment: NEGATIVE
Comment: NEGATIVE
Comment: NEGATIVE
Comment: NEGATIVE
Comment: NEGATIVE
Comment: NORMAL
Neisseria Gonorrhea: NEGATIVE
Trichomonas: NEGATIVE

## 2020-05-26 NOTE — Addendum Note (Signed)
Addended by: Varney Biles on: 05/26/2020 12:09 PM   Modules accepted: Orders

## 2020-05-26 NOTE — Addendum Note (Signed)
Addended by: Ranon Coven M on: 05/26/2020 12:09 PM   Modules accepted: Orders  

## 2020-05-26 NOTE — Addendum Note (Signed)
Addended by: Lake Bells on: 05/26/2020 10:55 AM   Modules accepted: Orders

## 2020-05-27 LAB — CERVICOVAGINAL ANCILLARY ONLY
Comment: NEGATIVE
High risk HPV: NEGATIVE

## 2020-05-31 ENCOUNTER — Other Ambulatory Visit: Payer: Self-pay

## 2020-05-31 ENCOUNTER — Ambulatory Visit: Payer: Self-pay

## 2020-05-31 ENCOUNTER — Ambulatory Visit (INDEPENDENT_AMBULATORY_CARE_PROVIDER_SITE_OTHER): Payer: 59 | Admitting: Family Medicine

## 2020-05-31 VITALS — Ht 65.0 in | Wt 168.0 lb

## 2020-05-31 DIAGNOSIS — M25512 Pain in left shoulder: Secondary | ICD-10-CM

## 2020-05-31 DIAGNOSIS — M5412 Radiculopathy, cervical region: Secondary | ICD-10-CM | POA: Diagnosis not present

## 2020-05-31 MED ORDER — PREDNISONE 5 MG PO TABS: ORAL_TABLET | ORAL | 0 refills | Status: DC

## 2020-05-31 NOTE — Assessment & Plan Note (Signed)
Symptoms seem more consistent with radicular pain. No structural changes on ultrasound.  - counseled on home exercise therapy and supportive care - prednisone  - could consider imaging and physical therapy.

## 2020-05-31 NOTE — Patient Instructions (Signed)
Good to see you Please try heat  Please try the exercises  Please try the prednisone   Please send me a message in MyChart with any questions or updates.  Please see me back in 2-3 weeks.   --Dr. Jordan Likes

## 2020-05-31 NOTE — Progress Notes (Signed)
Debra Pruitt - 31 y.o. female MRN [id number]  Date of birth: [birth year]  SUBJECTIVE:  Including CC & ROS.  No chief complaint on file.   Debra Pruitt is a 30 y.o. female that is presenting with acute left shoulder/arm pain. The pain is intermittent in nature. She loses the ability to move her shoulder and arm when it happens. No injury or inciting event. No surgery.    Review of Systems See HPI   HISTORY: Past Medical, Surgical, Social, and Family History Reviewed & Updated per EMR.   Pertinent Historical Findings include:  Past Medical History:  Diagnosis Date  . Anemia   . Anxiety   . Bacterial vaginosis   . Low back pain   . Ovarian cyst   . Peripheral neuropathy   . Vitamin D deficiency     Past Surgical History:  Procedure Laterality Date  . CESAREAN SECTION  04/30/2013  . MULTIPLE TOOTH EXTRACTIONS      Family History  Problem Relation Age of Onset  . Hypertension Mother   . Thyroid disease Mother   . Healthy Father   . Hypertension Maternal Grandmother   . Asthma Maternal Grandmother   . Hypertension Paternal Grandmother   . COPD Paternal Grandmother   . Heart disease Paternal Grandmother   . Hyperlipidemia Paternal Grandmother   . Stroke Paternal Grandmother   . Diabetes Paternal Grandmother   . Hepatitis C Paternal Grandmother   . Rheum arthritis Paternal Grandmother     Social History   Socioeconomic History  . Marital status: Married    Spouse name: Not on file  . Number of children: 1  . Years of education: Some coll  . Highest education level: Not on file  Occupational History  . Occupation: PCA  Tobacco Use  . Smoking status: Never Smoker  . Smokeless tobacco: Never Used  Vaping Use  . Vaping Use: Never used  Substance and Sexual Activity  . Alcohol use: Yes    Alcohol/week: 0.0 standard drinks    Comment: Rare - social  . Drug use: No  . Sexual activity: Yes    Birth control/protection: Injection  Other Topics Concern  . Not  on file  Social History Narrative   Lives at home with boyfriend and son.   Right-handed.   2 cups caffeine per day.   Social Determinants of Health   Financial Resource Strain: Not on file  Food Insecurity: Not on file  Transportation Needs: Not on file  Physical Activity: Not on file  Stress: Not on file  Social Connections: Not on file  Intimate Partner Violence: Not on file     PHYSICAL EXAM:  VS: Ht 5\' 5"  (1.651 m)   Wt 168 lb (76.2 kg)   BMI 27.96 kg/m  Physical Exam Gen: NAD, alert, cooperative with exam, well-appearing MSK:  Left shoulder:  Normal range of motion.  Normal strength to resistance  Normal empty can testing  Normal speed's test.  Neurovascularly intact.    Limited ultrasound: left shoulder:  Normal appearing biceps tendon  Normal appearing subscapularis  Supraspinatus with minor degenerative changes  Normal appearing posterior glenohumeral joint   Summary: no structural changes to the source of her pain  Ultrasound and interpretation by , MD    ASSESSMENT & PLAN:   Cervical radiculopathy Symptoms seem more consistent with radicular pain. No structural changes on ultrasound.  - counseled on home exercise therapy and supportive care - prednisone  - could consider imaging and physical  therapy.

## 2020-06-08 LAB — HM PAP SMEAR: HM Pap smear: NEGATIVE

## 2020-06-09 ENCOUNTER — Encounter: Payer: Self-pay | Admitting: Neurology

## 2020-06-09 ENCOUNTER — Other Ambulatory Visit: Payer: Self-pay

## 2020-06-09 ENCOUNTER — Ambulatory Visit
Admission: EM | Admit: 2020-06-09 | Discharge: 2020-06-09 | Disposition: A | Discharge status: Home / Self Care | Payer: 59 | Attending: Emergency Medicine | Admitting: Emergency Medicine

## 2020-06-09 ENCOUNTER — Telehealth (INDEPENDENT_AMBULATORY_CARE_PROVIDER_SITE_OTHER): Payer: 59 | Admitting: Neurology

## 2020-06-09 DIAGNOSIS — G40009 Localization-related (focal) (partial) idiopathic epilepsy and epileptic syndromes with seizures of localized onset, not intractable, without status epilepticus: Secondary | ICD-10-CM | POA: Diagnosis not present

## 2020-06-09 DIAGNOSIS — G43009 Migraine without aura, not intractable, without status migrainosus: Secondary | ICD-10-CM | POA: Diagnosis not present

## 2020-06-09 DIAGNOSIS — Z20822 Contact with and (suspected) exposure to covid-19: Secondary | ICD-10-CM | POA: Diagnosis not present

## 2020-06-09 DIAGNOSIS — J069 Acute upper respiratory infection, unspecified: Secondary | ICD-10-CM | POA: Diagnosis not present

## 2020-06-09 MED ORDER — FLUTICASONE PROPIONATE 50 MCG/ACT NA SUSP: 1.0000 | Freq: Every day | NASAL | 0 refills | Status: DC

## 2020-06-09 MED ORDER — IBUPROFEN 800 MG PO TABS: 800.0000 mg | ORAL_TABLET | Freq: Three times a day (TID) | ORAL | 0 refills | Status: DC

## 2020-06-09 MED ORDER — BENZONATATE 200 MG PO CAPS: 200.0000 mg | ORAL_CAPSULE | Freq: Three times a day (TID) | ORAL | 0 refills | Status: AC | PRN

## 2020-06-09 MED ORDER — RIZATRIPTAN BENZOATE 10 MG PO TBDP: ORAL_TABLET | ORAL | 6 refills | Status: DC

## 2020-06-09 MED ORDER — ZONISAMIDE 100 MG PO CAPS: ORAL_CAPSULE | ORAL | 11 refills | Status: DC

## 2020-06-09 NOTE — ED Triage Notes (Signed)
Pt c/o a fever of 101.5, pressure in head, sore throat, body aches, chills x 4 days. Pt states she took ibuprofen yesterday and allergy medicine. Pt states she does not know of exposure to someone who has COVID.

## 2020-06-09 NOTE — ED Provider Notes (Signed)
[name]    CSN: [id number] Arrival date & time: 06/09/20  1011      History   Chief Complaint Chief Complaint  Patient presents with  . Fever  . Headache  . Generalized Body Aches  . Sore Throat  . Chills    HPI Debra Pruitt is a 30 y.o. female presenting today for evaluation of body aches sore throat and fever.  Reports fever up to 101.5.  A lot of sinus and head pressure body aches and chills.  Using ibuprofen and allergy medicine.  Denies known COVID exposure.  HPI  Past Medical History:  Diagnosis Date  . Anemia   . Anxiety   . Bacterial vaginosis   . Low back pain   . Ovarian cyst   . Peripheral neuropathy   . Vitamin D deficiency     Patient Active Problem List   Diagnosis Date Noted  . Cervical radiculopathy 05/31/2020  . Capsulitis of toe of right foot 02/23/2020  . Vitamin D deficiency 01/09/2020  . Closed nondisplaced fracture of distal phalanx of great toe with routine healing, subsequent encounter 11/13/2019    Past Surgical History:  Procedure Laterality Date  . CESAREAN SECTION  04/30/2013  . MULTIPLE TOOTH EXTRACTIONS      OB History    Gravida  1   Para      Term      Preterm      AB      Living        SAB      IAB      Ectopic      Multiple      Live Births               Home Medications    Prior to Admission medications   Medication Sig Start Date End Date Taking? Authorizing Provider  benzonatate (TESSALON) 200 MG capsule Take 1 capsule (200 mg total) by mouth 3 (three) times daily as needed for up to 7 days for cough. 06/09/20 06/16/20 Yes Yanitza Shvartsman C, PA-C  fluticasone (FLONASE) 50 MCG/ACT nasal spray Place 1-2 sprays into both nostrils daily. 06/09/20  Yes Brithany Whitworth C, PA-C  ibuprofen (ADVIL) 800 MG tablet Take 1 tablet (800 mg total) by mouth 3 (three) times daily. 06/09/20  Yes Cooper Stamp C, PA-C  Vitamin D, Ergocalciferol, (DRISDOL) 1.25 MG (50000 UNIT) CAPS capsule Take 1  capsule (50,000 Units total) by mouth every 7 (seven) days. Patient not taking: No sig reported 01/09/20   Letta Median K, DO  rizatriptan (MAXALT-MLT) 10 MG disintegrating tablet Take 1 tablet at onset of migraine. Do not take more than 2-3 a week 02/10/20 06/09/20  Cameron Sprang, MD    Family History Family History  Problem Relation Age of Onset  . Hypertension Mother   . Thyroid disease Mother   . Healthy Father   . Hypertension Maternal Grandmother   . Asthma Maternal Grandmother   . Hypertension Paternal Grandmother   . COPD Paternal Grandmother   . Heart disease Paternal Grandmother   . Hyperlipidemia Paternal Grandmother   . Stroke Paternal Grandmother   . Diabetes Paternal Grandmother   . Hepatitis C Paternal Grandmother   . Rheum arthritis Paternal Grandmother     Social History Social History   Tobacco Use  . Smoking status: Never Smoker  . Smokeless tobacco: Never Used  Vaping Use  . Vaping Use: Never used  Substance Use Topics  . Alcohol use: Yes  Alcohol/week: 0.0 standard drinks    Comment: Rare - social  . Drug use: No     Allergies   Mangifera indica and Pineapple   Review of Systems Review of Systems  Constitutional: Positive for chills and fever. Negative for activity change, appetite change and fatigue.  HENT: Positive for congestion, rhinorrhea, sinus pressure and sore throat. Negative for ear pain and trouble swallowing.   Eyes: Negative for discharge and redness.  Respiratory: Positive for cough. Negative for chest tightness and shortness of breath.   Cardiovascular: Negative for chest pain.  Gastrointestinal: Negative for abdominal pain, diarrhea, nausea and vomiting.  Musculoskeletal: Positive for myalgias.  Skin: Negative for rash.  Neurological: Negative for dizziness, light-headedness and headaches.     Physical Exam Triage Vital Signs ED Triage Vitals  Enc Vitals Group     BP      Pulse      Resp      Temp      Temp src       SpO2      Weight      Height      Head Circumference      Peak Flow      Pain Score      Pain Loc      Pain Edu?      Excl. in [place]?    No data found.  Updated Vital Signs BP 117/80 (BP Location: Right Arm)   Pulse 100   Temp 98.1 F (36.7 C) (Oral)   Resp 17   LMP 05/27/2020   SpO2 97%   Breastfeeding Unknown   Visual Acuity Right Eye Distance:   Left Eye Distance:   Bilateral Distance:    Right Eye Near:   Left Eye Near:    Bilateral Near:     Physical Exam Vitals and nursing note reviewed.  Constitutional:      Appearance: She is well-developed and well-nourished.     Comments: No acute distress  HENT:     Head: Normocephalic and atraumatic.     Ears:     Comments: Bilateral ears without tenderness to palpation of external auricle, tragus and mastoid, EAC's without erythema or swelling, TM's with good bony landmarks and cone of light. Non erythematous.     Nose: Nose normal.     Mouth/Throat:     Comments: Oral mucosa pink and moist, no tonsillar enlargement or exudate. Posterior pharynx patent and nonerythematous, no uvula deviation or swelling. Normal phonation.  Eyes:     Conjunctiva/sclera: Conjunctivae normal.  Cardiovascular:     Rate and Rhythm: Normal rate.  Pulmonary:     Effort: Pulmonary effort is normal. No respiratory distress.     Comments: Breathing comfortably at rest, CTABL, no wheezing, rales or other adventitious sounds auscultated Abdominal:     General: There is no distension.  Musculoskeletal:        General: Normal range of motion.     Cervical back: Neck supple.  Skin:    General: Skin is warm and dry.  Neurological:     Mental Status: She is alert and oriented to person, place, and time.  Psychiatric:        Mood and Affect: Mood and affect normal.      UC Treatments / Results  Labs (all labs ordered are listed, but only abnormal results are displayed) Labs Reviewed  SARS CORONAVIRUS 2 (TAT 6-24 HRS)  COVID-19,  FLU A+B NAA    EKG   Radiology  No results found.  Procedures Procedures (including critical care time)  Medications Ordered in UC Medications - No data to display  Initial Impression / Assessment and Plan / UC Course  I have reviewed the triage vital signs and the nursing notes.  Pertinent labs & imaging results that were available during my care of the patient were reviewed by me and considered in my medical decision making (see chart for details).     Viral URI with cough-COVID/flu test pending; exam reassuring, lungs clear to auscultation, vital signs stable.  Recommended symptomatic and supportive care rest and fluids with continued close monitoring.  Discussed strict return precautions. Patient verbalized understanding and is agreeable with plan.  Final Clinical Impressions(s) / UC Diagnoses   Final diagnoses:  Encounter for screening laboratory testing for COVID-19 virus  Viral URI with cough     Discharge Instructions     COVID/flu test pending, monitor MyChart for results Ibuprofen and Tylenol for headache, body aches, fevers Tessalon for cough every 8 hours as needed May continue Zyrtec, add in Flonase for congestion/sinus pressure Rest and fluids Follow-up if not improving or worsening    ED Prescriptions    Medication Sig Dispense Auth. Provider   fluticasone (FLONASE) 50 MCG/ACT nasal spray Place 1-2 sprays into both nostrils daily. 16 g Sherol Sabas C, PA-C   benzonatate (TESSALON) 200 MG capsule Take 1 capsule (200 mg total) by mouth 3 (three) times daily as needed for up to 7 days for cough. 28 capsule Yavuz Kirby C, PA-C   ibuprofen (ADVIL) 800 MG tablet Take 1 tablet (800 mg total) by mouth 3 (three) times daily. 21 tablet Deandria Klute, [place] C, PA-C     PDMP not reviewed this encounter.   Janith Lima, PA-C 06/09/20 1141

## 2020-06-09 NOTE — Progress Notes (Signed)
Virtual Visit via Video Note The purpose of this virtual visit is to provide medical care while limiting exposure to the novel coronavirus.    Consent was obtained for video visit:  Yes.   Answered questions that patient had about telehealth interaction:  Yes.   I discussed the limitations, risks, security and privacy concerns of performing an evaluation and management service by telemedicine. I also discussed with the patient that there may be a patient responsible charge related to this service. The patient expressed understanding and agreed to proceed.  Pt location: Home Physician Location: office Name of referring provider:  Ronnald Nian, DO I connected with [name] at patients initiation/request on 06/09/2020 at  2:00 PM EST by video enabled telemedicine application and verified that I am speaking with the correct person using two identifiers. Pt MRN:  [id number] Pt DOB:  04-Jun-1990 Video Participants:  [name]   History of Present Illness:  The patient had a virtual video visit on 06/09/2020. She was last seen 4 months ago for seizures and migraines. She was started on Zonisamide 300mg  qhs and notes some improvement in her symptoms. No side effects. Her husband has not mentioned any nocturnal shaking episodes, however there have been 3 or 4 times she has woken up with unexplained bruises and a bite inside her cheek, which is better than before. There have been a few times she had zoned out, last was a week ago. She feels they are not as often. She has had 10 migraines in the past 4 months, which is also better. She has not been able to get the Maxalt from the pharmacy. She has had 2 episodes of paresthesias in her hands/feet but nothing bad. She denies any dizziness, vision changes, no falls. Sleep is okay, mood has been good. She has not been feeling well with COVID-like symptoms and went to Urgent Care this morning for testing, results are not back yet.    History  on Initial Assessment 02/10/2020: This is a 30 year old right-handed woman presenting to establish care for seizures. She had previously been seeing [facility] at [facility], records were reviewed. She was initially evaluated in April 2020 for nocturnal shaking episodes. She states that looking back, she has had unexplained episodes since childhood where she would wake up with bruises, feeling extremely tired, tongue felt weird. When younger, she would wake up with urinary incontinence. This has not occurred in the 4 years they have been together. She reported these would occur around once a month. She had an MRI brain with and without contrast in May 2020 which was normal, hippocampi symmetric. She had a 72-hour EEG in June 2020 which was normal, however typical events were not captured. She was started on Topiramate for seizure and migraine prophylaxis however this made her feel depressed/"like I was having a breakdown." She was switched to Aptiom 800mg  daily, she does not think it helped, and it was also cost-prohibitive, so she stopped it after her last visit with her prior neurologist in April 2021. Her husband has witnessed 3 nocturnal seizures this year, he would feel shaking, the last episode she was making a humming sort of noise. They only last a few seconds, he puts her hand on her and it stops. She would be a little bit groggy, and would say she is sore then go back to sleep. She still wakes up with unexplained bruises and feeling weird, last occurrence was a couple of weeks ago. No  clear triggers. After her visit with Neurology, she was asking about zoning out, and has noticed that in the past year, she would have times where she is talking then zones out, she comes back and asks what happened. For that moment of time she is not there. There is no clear warning. He states she responds when he alerts her, these do not occur frequently. She has occasional body jerks/twitches. She denies any  olfactory/gustatory hallucinations, rising epigastric sensation, focal numbness/tingling/weakness. Her maternal uncle and distant cousin have seizures. Otherwise she had a normal birth and early development.  There is no history of febrile convulsions, CNS infections such as meningitis/encephalitis, significant traumatic brain injury, neurosurgical procedures.  She has had migraines since her 45s, usually starting in the frontal region then radiating diffusely. She is sensitive to lights and sounds, no nausea/vomiting. Migraines can occur every 1-2 weeks, lasting a few minutes to all day. She was tried on amitriptyline but this caused drowsiness, she stopped it in May. She was given prn Baclofen and sumatriptan but made her feel weird. Excedrin and Ibuprofen do not help. She does not think they helped with the migraines. Her mother has migraines. She reports her mood is fine. She had a fall in January. A year ago her legs gave out on her when she stood up, she could not feel her legs for a few minutes. No pregnancy plans, she is s/p tubal ligation.  Diagnostic Data: MRI brain with and without contrast in May 2020 was normal, hippocampi symmetric.  72-hour EEG in June 2020 which was normal, however typical events were not captured    Outpatient Encounter Medications as of 06/09/2020  Medication Sig  . zonisamide (ZONEGRAN) 100 MG capsule Take 100 mg by mouth daily. Take 3 caps every night  . fluticasone (FLONASE) 50 MCG/ACT nasal spray Place 1-2 sprays into both nostrils daily.  . rizatriptan (MAXALT-MLT) 10 MG disintegrating tablet Take 1 tablet at onset of migraine. Do not take more than 2-3 a week (Patient not taking)  . Vitamin D, Ergocalciferol, (DRISDOL) 1.25 MG (50000 UNIT) CAPS capsule Take 1 capsule (50,000 Units total) by mouth every 7 (seven) days. (Patient not taking: No sig reported)  .    Marland Kitchen     No facility-administered encounter medications on file as of 06/09/2020.     Observations/Objective:   GEN:  The patient appears stated age and is in NAD.  Neurological examination: Patient is awake, alert. No aphasia or dysarthria. Intact fluency and comprehension. Cranial nerves: Extraocular movements intact with no nystagmus. No facial asymmetry. Motor: moves all extremities symmetrically, at least anti-gravity x 4.    Assessment and Plan:   This is a 30 yo RH woman with a history of migraines and recurrent nocturnal seizures since childhood, also recently noticing episodes of zoning out during the day. Her MRI brain and 72-hour EEG were normal, however typical events were not captured. Seizures suggestive of focal to bilateral tonic-clonic epilepsy, etiology unknown. There has been some response to initiation of Zonisamide, increase dose to 400mg  qhs. Continue seizure and migraine calendar. Maxalt prescription will be sent in again for migraine rescue, she will let us know if any issues at pharmacy. She is aware of [place] driving laws to stop driving after a seizure until 6 months seizure-free. Follow-up in 3-4 months, she knows to call for any changes.    Follow Up Instructions:   -I discussed the assessment and treatment plan with the patient. The patient was provided an  opportunity to ask questions and all were answered. The patient agreed with the plan and demonstrated an understanding of the instructions.   The patient was advised to call back or seek an in-person evaluation if the symptoms worsen or if the condition fails to improve as anticipated.     Cameron Sprang, MD

## 2020-06-09 NOTE — Discharge Instructions (Signed)
COVID/flu test pending, monitor MyChart for results Ibuprofen and Tylenol for headache, body aches, fevers Tessalon for cough every 8 hours as needed May continue Zyrtec, add in Flonase for congestion/sinus pressure Rest and fluids Follow-up if not improving or worsening

## 2020-06-10 ENCOUNTER — Telehealth: Payer: 59 | Admitting: Family

## 2020-06-10 DIAGNOSIS — R059 Cough, unspecified: Secondary | ICD-10-CM

## 2020-06-10 MED ORDER — AMOXICILLIN 875 MG PO TABS: 875.0000 mg | ORAL_TABLET | Freq: Two times a day (BID) | ORAL | 0 refills | Status: DC

## 2020-06-10 MED ORDER — BENZONATATE 100 MG PO CAPS: 100.0000 mg | ORAL_CAPSULE | Freq: Two times a day (BID) | ORAL | 0 refills | Status: DC | PRN

## 2020-06-10 NOTE — Progress Notes (Signed)
We are sorry that you are not feeling well.  Here is how we plan to help!  Based on your presentation I believe you most likely have A cough due to a virus.  This is called viral bronchitis and is best treated by rest, plenty of fluids and control of the cough.  You may use Ibuprofen or Tylenol as directed to help your symptoms.     In addition you may use A prescription cough medication called Tessalon Perles 100mg. You may take 1-2 capsules every 8 hours as needed for your cough.   From your responses in the eVisit questionnaire you describe inflammation in the upper respiratory tract which is causing a significant cough.  This is commonly called Bronchitis and has four common causes:    Allergies  Viral Infections  Acid Reflux  Bacterial Infection Allergies, viruses and acid reflux are treated by controlling symptoms or eliminating the cause. An example might be a cough caused by taking certain blood pressure medications. You stop the cough by changing the medication. Another example might be a cough caused by acid reflux. Controlling the reflux helps control the cough.  USE OF BRONCHODILATOR ("RESCUE") INHALERS: There is a risk from using your bronchodilator too frequently.  The risk is that over-reliance on a medication which only relaxes the muscles surrounding the breathing tubes can reduce the effectiveness of medications prescribed to reduce swelling and congestion of the tubes themselves.  Although you feel brief relief from the bronchodilator inhaler, your asthma may actually be worsening with the tubes becoming more swollen and filled with mucus.  This can delay other crucial treatments, such as oral steroid medications. If you need to use a bronchodilator inhaler daily, several times per day, you should discuss this with your provider.  There are probably better treatments that could be used to keep your asthma under control.     HOME CARE . Only take medications as instructed by  your medical team. . Complete the entire course of an antibiotic. . Drink plenty of fluids and get plenty of rest. . Avoid close contacts especially the very young and the elderly . Cover your mouth if you cough or cough into your sleeve. . Always remember to wash your hands . A steam or ultrasonic humidifier can help congestion.   GET HELP RIGHT AWAY IF: . You develop worsening fever. . You become short of breath . You cough up blood. . Your symptoms persist after you have completed your treatment plan MAKE SURE YOU   Understand these instructions.  Will watch your condition.  Will get help right away if you are not doing well or get worse.  Your e-visit answers were reviewed by a board certified advanced clinical practitioner to complete your personal care plan.  Depending on the condition, your plan could have included both over the counter or prescription medications. If there is a problem please reply  once you have received a response from your provider. Your safety is important to us.  If you have drug allergies check your prescription carefully.    You can use MyChart to ask questions about today's visit, request a non-urgent call back, or ask for a work or school excuse for 24 hours related to this e-Visit. If it has been greater than 24 hours you will need to follow up with your provider, or enter a new e-Visit to address those concerns. You will get an e-mail in the next two days asking about your experience.  I   hope that your e-visit has been valuable and will speed your recovery. Thank you for using e-visits.  Greater than 5 minutes, yet less than 10 minutes of time have been spent researching, coordinating, and implementing care for this patient.   

## 2020-06-10 NOTE — Addendum Note (Signed)
Addended by: Sherlene Shams on: 06/10/2020 11:57 AM   Modules accepted: Orders

## 2020-06-11 LAB — COVID-19, FLU A+B NAA
Influenza A, NAA: NOT DETECTED
Influenza B, NAA: NOT DETECTED
SARS-CoV-2, NAA: NOT DETECTED

## 2020-06-21 ENCOUNTER — Ambulatory Visit: Payer: 59 | Admitting: Family Medicine

## 2020-06-22 ENCOUNTER — Ambulatory Visit: Payer: 59 | Admitting: Family Medicine

## 2020-06-29 ENCOUNTER — Ambulatory Visit: Payer: 59 | Admitting: Family Medicine

## 2020-06-29 NOTE — Progress Notes (Deleted)
  Debra Pruitt - 30 y.o. female MRN [id number]  Date of birth: 1991-05-19  SUBJECTIVE:  Including CC & ROS.  No chief complaint on file.   Debra Pruitt is a 30 y.o. female that is  ***.  ***   Review of Systems See HPI   HISTORY: Past Medical, Surgical, Social, and Family History Reviewed & Updated per EMR.   Pertinent Historical Findings include:  Past Medical History:  Diagnosis Date  . Anemia   . Anxiety   . Bacterial vaginosis   . Low back pain   . Ovarian cyst   . Peripheral neuropathy   . Vitamin D deficiency     Past Surgical History:  Procedure Laterality Date  . CESAREAN SECTION  04/30/2013  . MULTIPLE TOOTH EXTRACTIONS      Family History  Problem Relation Age of Onset  . Hypertension Mother   . Thyroid disease Mother   . Healthy Father   . Hypertension Maternal Grandmother   . Asthma Maternal Grandmother   . Hypertension Paternal Grandmother   . COPD Paternal Grandmother   . Heart disease Paternal Grandmother   . Hyperlipidemia Paternal Grandmother   . Stroke Paternal Grandmother   . Diabetes Paternal Grandmother   . Hepatitis C Paternal Grandmother   . Rheum arthritis Paternal Grandmother     Social History   Socioeconomic History  . Marital status: Married    Spouse name: Not on file  . Number of children: 1  . Years of education: Some coll  . Highest education level: Not on file  Occupational History  . Occupation: PCA  Tobacco Use  . Smoking status: Never Smoker  . Smokeless tobacco: Never Used  Vaping Use  . Vaping Use: Never used  Substance and Sexual Activity  . Alcohol use: Yes    Alcohol/week: 0.0 standard drinks    Comment: Rare - social  . Drug use: No  . Sexual activity: Yes    Birth control/protection: Injection  Other Topics Concern  . Not on file  Social History Narrative   Lives at home with boyfriend and son.   Right-handed.   2 cups caffeine per day.   Social Determinants of Health   Financial Resource  Strain: Not on file  Food Insecurity: Not on file  Transportation Needs: Not on file  Physical Activity: Not on file  Stress: Not on file  Social Connections: Not on file  Intimate Partner Violence: Not on file     PHYSICAL EXAM:  VS: There were no vitals taken for this visit. Physical Exam Gen: NAD, alert, cooperative with exam, well-appearing MSK:  ***      ASSESSMENT & PLAN:   No problem-specific Assessment & Plan notes found for this encounter.

## 2020-09-22 ENCOUNTER — Other Ambulatory Visit: Payer: Self-pay

## 2020-09-23 ENCOUNTER — Telehealth (INDEPENDENT_AMBULATORY_CARE_PROVIDER_SITE_OTHER): Payer: 59 | Admitting: Internal Medicine

## 2020-09-23 ENCOUNTER — Ambulatory Visit: Payer: 59 | Admitting: Family Medicine

## 2020-09-23 ENCOUNTER — Ambulatory Visit: Payer: 59 | Admitting: Nurse Practitioner

## 2020-09-23 ENCOUNTER — Encounter: Payer: Self-pay | Admitting: Internal Medicine

## 2020-09-23 DIAGNOSIS — T7840XA Allergy, unspecified, initial encounter: Secondary | ICD-10-CM | POA: Diagnosis not present

## 2020-09-23 MED ORDER — PREDNISONE 20 MG PO TABS: ORAL_TABLET | ORAL | 0 refills | Status: DC

## 2020-09-23 NOTE — Progress Notes (Signed)
Virtual Visit via Video Note  I connected with@ on 09/23/20 at 10:00 AM EDT by a video enabled telemedicine application and verified that I am speaking with the correct person using two identifiers. Location patient: home Location provider: home office Persons participating in the virtual visit: patient, provider  [place] national recommendations  regarding COVID 19 pandemic   video visit is advised over in office visit for this patient.  Patient aware  of the limitations of evaluation and management by telemedicine and  availability of in person appointments. and agreed to proceed.   HPI: Debra Pruitt presents for video visit for acute problem PCP NA  took a new OTC  supp GOLI  About 48 hours ago and soon after  Hours began to have rash and itching face     And then itching all over but rash only  On face.   This was only new med   Is on zonisesimide but stopped temp.  No assoc  Sx  yesterfay had itchy throat but not today and no resp gi distress.   ROS: See pertinent positives and negatives per HPI. No fever other assoc sx   Past Medical History:  Diagnosis Date  . Anemia   . Anxiety   . Bacterial vaginosis   . Low back pain   . Ovarian cyst   . Peripheral neuropathy   . Vitamin D deficiency     Past Surgical History:  Procedure Laterality Date  . CESAREAN SECTION  04/30/2013  . MULTIPLE TOOTH EXTRACTIONS      Family History  Problem Relation Age of Onset  . Hypertension Mother   . Thyroid disease Mother   . Healthy Father   . Hypertension Maternal Grandmother   . Asthma Maternal Grandmother   . Hypertension Paternal Grandmother   . COPD Paternal Grandmother   . Heart disease Paternal Grandmother   . Hyperlipidemia Paternal Grandmother   . Stroke Paternal Grandmother   . Diabetes Paternal Grandmother   . Hepatitis C Paternal Grandmother   . Rheum arthritis Paternal Grandmother     Social History   Tobacco Use  . Smoking status: Never Smoker  . Smokeless  tobacco: Never Used  Vaping Use  . Vaping Use: Never used  Substance Use Topics  . Alcohol use: Yes    Alcohol/week: 0.0 standard drinks    Comment: Rare - social  . Drug use: No      Current Outpatient Medications:  .  amoxicillin (AMOXIL) 875 MG tablet, Take 1 tablet (875 mg total) by mouth 2 (two) times daily., Disp: 20 tablet, Rfl: 0 .  benzonatate (TESSALON) 100 MG capsule, Take 1 capsule (100 mg total) by mouth 2 (two) times daily as needed for cough., Disp: 20 capsule, Rfl: 0 .  fluticasone (FLONASE) 50 MCG/ACT nasal spray, Place 1-2 sprays into both nostrils daily., Disp: 16 g, Rfl: 0 .  ibuprofen (ADVIL) 800 MG tablet, Take 1 tablet (800 mg total) by mouth 3 (three) times daily., Disp: 21 tablet, Rfl: 0 .  predniSONE (DELTASONE) 20 MG tablet, Take 60 mg  by mouth day 1, then 40 mg per day for 5 days for allergic reaction, Disp: 13 tablet, Rfl: 0 .  rizatriptan (MAXALT-MLT) 10 MG disintegrating tablet, Take 1 tablet at onset of migraine. Do not take more than 2-3 a week, Disp: 10 tablet, Rfl: 6 .  Vitamin D, Ergocalciferol, (DRISDOL) 1.25 MG (50000 UNIT) CAPS capsule, Take 1 capsule (50,000 Units total) by mouth every 7 (seven) days.,  Disp: 5 capsule, Rfl: 2 .  zonisamide (ZONEGRAN) 100 MG capsule, Take 4 capsules every night, Disp: 120 capsule, Rfl: 11  EXAM: BP Readings from Last 3 Encounters:  06/09/20 117/80  05/24/20 116/70  05/17/20 118/64    VITALS per patient if applicable:  GENERAL: alert, oriented, appears well and in no acute distress  rde bumps on face ? Mild swelling no angioedema  Nl speech and non toxic   HEENT: atraumatic, conjunttiva clear, no obvious abnormalities on inspection of external nose and ears  NECK: normal movements of the head and neck  LUNGS: on inspection no signs of respiratory distress, breathing rate appears normal, no obvious gross SOB, gasping or wheezing  CV: no obvious cyanosis  MS: moves all visible extremities without  noticeable abnormality  PSYCH/NEURO: pleasant and cooperative, no obvious depression or anxiety, speech and thought processing grossly intact Lab Results  Component Value Date   WBC 7.0 01/08/2020   HGB 13.2 01/08/2020   HCT 39.1 01/08/2020   PLT 189.0 01/08/2020   GLUCOSE 95 01/08/2020   CHOL 166 01/08/2020   TRIG 48.0 01/08/2020   HDL 60.50 01/08/2020   LDLCALC 95 01/08/2020   ALT 10 01/08/2020   AST 14 01/08/2020   NA 141 01/08/2020   K 4.5 01/08/2020   CL 103 01/08/2020   CREATININE 0.78 01/08/2020   BUN 11 01/08/2020   CO2 30 01/08/2020    ASSESSMENT AND PLAN:  Discussed the following assessment and plan:    ICD-10-CM   1. Allergic reaction, initial encounter  T78.40XA    most likely from [facility]  new   2. Allergic rash present on examination  T78.40XA    Allergic itching reaction  With rash  Most likely the GOLI  Based on context  No anaphylaxis  Past hx of itching with morphine Counseled.   Take  Around the clock antihistamine   Zyrtec and benadryl 25 every 6 hours or so as needed.  Disc prednisone  Course   Prefers to wait and I feels safe .   Can see how doing  tomorrow  To begin .  reviewed  Need for ed care for alarm sx but feel  avoidance  And antihistamine may be enough .   Expectant management and discussion of plan and treatment with opportunity to ask questions and all were answered. The patient agreed with the plan and demonstrated an understanding of the instructions.  31 minutes  Review ass and counsel and plan  Advised to call back or seek an in-person evaluation if worsening  or having  further concerns . Return if symptoms worsen or fail to improve as expected.    Shanon Ace, MD

## 2020-09-30 ENCOUNTER — Ambulatory Visit: Payer: 59 | Admitting: Family Medicine

## 2020-10-20 ENCOUNTER — Other Ambulatory Visit: Payer: Self-pay

## 2020-10-20 ENCOUNTER — Ambulatory Visit (INDEPENDENT_AMBULATORY_CARE_PROVIDER_SITE_OTHER): Payer: 59 | Admitting: Neurology

## 2020-10-20 ENCOUNTER — Encounter: Payer: Self-pay | Admitting: Neurology

## 2020-10-20 VITALS — BP 106/72 | HR 78 | Ht 66.0 in | Wt 167.4 lb

## 2020-10-20 DIAGNOSIS — G43009 Migraine without aura, not intractable, without status migrainosus: Secondary | ICD-10-CM

## 2020-10-20 DIAGNOSIS — G40009 Localization-related (focal) (partial) idiopathic epilepsy and epileptic syndromes with seizures of localized onset, not intractable, without status epilepticus: Secondary | ICD-10-CM | POA: Diagnosis not present

## 2020-10-20 MED ORDER — ZONISAMIDE 100 MG PO CAPS
ORAL_CAPSULE | ORAL | 11 refills | Status: DC
Start: 2020-10-20 — End: 2021-02-24

## 2020-10-20 NOTE — Progress Notes (Signed)
Medication Samples have been provided to the patient.  Drug name: nurtec     Strength: 75mg         Qty:1 LOT: [id number]   Exp.Date: 10/22  Dosing instructions: take no more than 1 tablet in 24 hours PRN for headaches  The patient has been instructed regarding the correct time, dose, and frequency of taking this medication, including desired effects and most common side effects.   Medication Samples have been provided to the patient.  Drug name: Roselyn Meier    Strength: 100mg       Qty: 4  LOT: [id number]  Exp.Date: 12/22  Dosing instructions: take one tablet at on set of headache may take another in 2 hours if needed no more than 2 tablets in 24 hours   The patient has been instructed regarding the correct time, dose, and frequency of taking this medication, including desired effects and most common side effects.   10:21 AM 10/20/2020

## 2020-10-20 NOTE — Progress Notes (Signed)
Pt stopped her zonisamide because she got the letter in the mail, she had a seizure needs to be restarted on her medication

## 2020-10-20 NOTE — Patient Instructions (Signed)
1. Restart Zonisamide 100mg : take 4 capsules every night  2. Try rescue Nurtec for migraine. You will also have a sample for Ubrelvy to try another time. Let me know which is helpful and we can send refills.   3. Continue symptom calendar  4. Follow-up in 4-5 months, call for any changes   Seizure Precautions: 1. If medication has been prescribed for you to prevent seizures, take it exactly as directed.  Do not stop taking the medicine without talking to your doctor first, even if you have not had a seizure in a long time.   2. Avoid activities in which a seizure would cause danger to yourself or to others.  Don't operate dangerous machinery, swim alone, or climb in high or dangerous places, such as on ladders, roofs, or girders.  Do not drive unless your doctor says you may.  3. If you have any warning that you may have a seizure, lay down in a safe place where you can't hurt yourself.    4.  No driving for 6 months from last seizure, as per [facility].   Please refer to the following link on the [place] website for more information: http://www.epilepsyfoundation.org/answerplace/Social/driving/drivingu.cfm   5.  Maintain good sleep hygiene. Avoid alcohol.  6.  Notify your neurology if you are planning pregnancy or if you become pregnant.  7.  Contact your doctor if you have any problems that may be related to the medicine you are taking.  8.  Call 911 and bring the patient back to the ED if:        A.  The seizure lasts longer than 5 minutes.       B.  The patient doesn't awaken shortly after the seizure  C.  The patient has new problems such as difficulty seeing, speaking or moving  D.  The patient was injured during the seizure  E.  The patient has a temperature over 102 F (39C)  F.  The patient vomited and now is having trouble breathing

## 2020-10-20 NOTE — Progress Notes (Signed)
NEUROLOGY FOLLOW UP OFFICE NOTE  Debra Pruitt [id number] 03-13-1991  HISTORY OF PRESENT ILLNESS: I had the pleasure of seeing Debra Pruitt in follow-up in the neurology clinic on 10/20/2020.  The patient was last seen 4 months ago for seizures and migraines. On her last visit, Zonisamide was increased to 400mg  qhs. She had noted some improvement but was still having episodes where she would wake up with unexplained bruises or bites in her cheek, as well as zoning out. Since her last visit, she reports stopping Zonisamide 1.5 weeks ago after getting repeated phone calls from [facility] to stop her medication due to being on recall. She did not contact our office since she was coming for an appointment today, but then woke up Tuesday morning with urinary incontinence. She felt stuck. They had also recently returned from travel and had been stressed out. Her husband has not mentioned any nocturnal shaking episodes since her last visit. There are times the sides of her tongue are sore. Sometimes she is told she is not responding/zoning out, occurring around once a week. The migraines are much better, she has then around once a month. The last migraine was in April, Maxalt did not help, she took Excedrin and lay down. Sleep is okay. Mood is pretty good. She denies any dizziness, vision changes, no falls. She has noticed a decrease in libido and wonders if this is due to Zonisamide. No pregnancy plans.    History on Initial Assessment 02/10/2020: This is a 30 year old right-handed woman presenting to establish care for seizures. She had previously been seeing [facility] at [facility], records were reviewed. She was initially evaluated in April 2020 for nocturnal shaking episodes. She states that looking back, she has had unexplained episodes since childhood where she would wake up with bruises, feeling extremely tired, tongue felt weird. When younger, she would wake up with urinary incontinence.  This has not occurred in the 4 years they have been together. She reported these would occur around once a month. She had an MRI brain with and without contrast in May 2020 which was normal, hippocampi symmetric. She had a 72-hour EEG in June 2020 which was normal, however typical events were not captured. She was started on Topiramate for seizure and migraine prophylaxis however this made her feel depressed/"like I was having a breakdown." She was switched to Aptiom 800mg  daily, she does not think it helped, and it was also cost-prohibitive, so she stopped it after her last visit with her prior neurologist in April 2021. Her husband has witnessed 3 nocturnal seizures this year, he would feel shaking, the last episode she was making a humming sort of noise. They only last a few seconds, he puts her hand on her and it stops. She would be a little bit groggy, and would say she is sore then go back to sleep. She still wakes up with unexplained bruises and feeling weird, last occurrence was a couple of weeks ago. No clear triggers. After her visit with Neurology, she was asking about zoning out, and has noticed that in the past year, she would have times where she is talking then zones out, she comes back and asks what happened. For that moment of time she is not there. There is no clear warning. He states she responds when he alerts her, these do not occur frequently. She has occasional body jerks/twitches. She denies any olfactory/gustatory hallucinations, rising epigastric sensation, focal numbness/tingling/weakness. Her maternal uncle and distant cousin  have seizures. Otherwise she had a normal birth and early development.  There is no history of febrile convulsions, CNS infections such as meningitis/encephalitis, significant traumatic brain injury, neurosurgical procedures.  She has had migraines since her 62s, usually starting in the frontal region then radiating diffusely. She is sensitive to lights and sounds,  no nausea/vomiting. Migraines can occur every 1-2 weeks, lasting a few minutes to all day. She was tried on amitriptyline but this caused drowsiness, she stopped it in May. She was given prn Baclofen and sumatriptan but made her feel weird. Excedrin and Ibuprofen do not help. She does not think they helped with the migraines. Her mother has migraines. She reports her mood is fine. She had a fall in January. A year ago her legs gave out on her when she stood up, she could not feel her legs for a few minutes. No pregnancy plans, she is s/p tubal ligation.  Diagnostic Data: MRI brain with and without contrast in May 2020 was normal, hippocampi symmetric.  72-hour EEG in June 2020 which was normal, however typical events were not captured  Prior migraine rescue medications: sumatriptan, Maxalt, Baclofen, Excedrin, Ibuprofen  PAST MEDICAL HISTORY: Past Medical History:  Diagnosis Date  . Anemia   . Anxiety   . Bacterial vaginosis   . Low back pain   . Ovarian cyst   . Peripheral neuropathy   . Vitamin D deficiency     MEDICATIONS: Current Outpatient Medications on File Prior to Visit  Medication Sig Dispense Refill  . amoxicillin (AMOXIL) 875 MG tablet Take 1 tablet (875 mg total) by mouth 2 (two) times daily. 20 tablet 0  . benzonatate (TESSALON) 100 MG capsule Take 1 capsule (100 mg total) by mouth 2 (two) times daily as needed for cough. 20 capsule 0  . fluticasone (FLONASE) 50 MCG/ACT nasal spray Place 1-2 sprays into both nostrils daily. 16 g 0  . ibuprofen (ADVIL) 800 MG tablet Take 1 tablet (800 mg total) by mouth 3 (three) times daily. 21 tablet 0  . predniSONE (DELTASONE) 20 MG tablet Take 60 mg  by mouth day 1, then 40 mg per day for 5 days for allergic reaction 13 tablet 0  . rizatriptan (MAXALT-MLT) 10 MG disintegrating tablet Take 1 tablet at onset of migraine. Do not take more than 2-3 a week 10 tablet 6  . Vitamin D, Ergocalciferol, (DRISDOL) 1.25 MG (50000 UNIT) CAPS capsule  Take 1 capsule (50,000 Units total) by mouth every 7 (seven) days. 5 capsule 2  . zonisamide (ZONEGRAN) 100 MG capsule Take 4 capsules every night 120 capsule 11   No current facility-administered medications on file prior to visit.    ALLERGIES: Allergies  Allergen Reactions  . Mangifera Indica Swelling  . Mango Flavor Swelling  . Pineapple Swelling  . Other Hives, Itching and Rash    Goli Nutrition supplement    FAMILY HISTORY: Family History  Problem Relation Age of Onset  . Hypertension Mother   . Thyroid disease Mother   . Healthy Father   . Hypertension Maternal Grandmother   . Asthma Maternal Grandmother   . Hypertension Paternal Grandmother   . COPD Paternal Grandmother   . Heart disease Paternal Grandmother   . Hyperlipidemia Paternal Grandmother   . Stroke Paternal Grandmother   . Diabetes Paternal Grandmother   . Hepatitis C Paternal Grandmother   . Rheum arthritis Paternal Grandmother     SOCIAL HISTORY: Social History   Socioeconomic History  . Marital status:  Married    Spouse name: Not on file  . Number of children: 1  . Years of education: Some coll  . Highest education level: Not on file  Occupational History  . Occupation: PCA  Tobacco Use  . Smoking status: Never Smoker  . Smokeless tobacco: Never Used  Vaping Use  . Vaping Use: Never used  Substance and Sexual Activity  . Alcohol use: Yes    Alcohol/week: 0.0 standard drinks    Comment: Rare - social  . Drug use: No  . Sexual activity: Yes    Birth control/protection: Injection  Other Topics Concern  . Not on file  Social History Narrative   Lives at home with boyfriend and son.   Right-handed.   2 cups caffeine per day.   Social Determinants of Health   Financial Resource Strain: Not on file  Food Insecurity: Not on file  Transportation Needs: Not on file  Physical Activity: Not on file  Stress: Not on file  Social Connections: Not on file  Intimate Partner Violence: Not on  file     PHYSICAL EXAM: Vitals:   10/20/20 0943  BP: 106/72  Pulse: 78  SpO2: 99%   General: No acute distress Head:  Normocephalic/atraumatic Skin/Extremities: No rash, no edema Neurological Exam: alert and awake. No aphasia or dysarthria. Fund of knowledge is appropriate.  Attention and concentration are normal.   Cranial nerves: Pupils equal, round. Extraocular movements intact with no nystagmus. Visual fields full.  No facial asymmetry.  Motor: Bulk and tone normal, muscle strength 5/5 throughout with no pronator drift.   Finger to nose testing intact.  Gait narrow-based and steady, able to tandem walk adequately.  Romberg negative.   IMPRESSION: This is a 31 yo RH woman with a history of migraines and recurrent nocturnal seizures since childhood, also recently noticing episodes of zoning out during the day. Her MRI brain and 72-hour EEG were normal, however typical events were not captured. Seizures suggestive of focal to bilateral tonic-clonic epilepsy, etiology unknown. She had a nocturnal seizure with urinary incontinence last 10/18/20, likely due to stopping Zonisamide after being told by pharmacy to stop due to medication recall. Discussed that she should restart Zonisamide 400mg  qhs from a different manufacturer from [facility]. Migraines have improved, she will try samples of Nurtec and Ubrelvy and let us know which is effective so refills can be sent. She is aware of [place] driving laws to stop driving after an episode of loss of awareness until 6 months seizure-free. Follow-up in 4 months, call for any changes.   Thank you for allowing me to participate in her care.  Please do not hesitate to call for any questions or concerns.   Ellouise Newer, M.D.   CC: Dr. Bryan Lemma

## 2020-11-26 ENCOUNTER — Encounter: Payer: Self-pay | Admitting: Family Medicine

## 2020-12-08 ENCOUNTER — Other Ambulatory Visit: Payer: Self-pay

## 2020-12-09 ENCOUNTER — Encounter: Payer: Self-pay | Admitting: Family Medicine

## 2020-12-09 ENCOUNTER — Ambulatory Visit (INDEPENDENT_AMBULATORY_CARE_PROVIDER_SITE_OTHER): Payer: 59 | Admitting: Family Medicine

## 2020-12-09 VITALS — BP 102/70 | HR 87 | Temp 97.4°F | Ht 66.0 in | Wt 163.4 lb

## 2020-12-09 DIAGNOSIS — K5901 Slow transit constipation: Secondary | ICD-10-CM

## 2020-12-09 NOTE — Patient Instructions (Addendum)
Increase water intake - goal of 64+ oz of water per day Regular exercise can help with regular bowel movements as well Increase dietary fiber as well  Miralax 1 capful in at least 4oz water 2x/day Stool softener like colace 100mg  1 cap 2/xday If no BM in 3 days, take 1 dose of dulcolax Once you have a few large bowel movements, stop above and just take miralax 1 capful in 4-6oz of water daily. If too much, then try 1/2 cap daily or 1 cap every other day

## 2020-12-09 NOTE — Progress Notes (Signed)
Debra Pruitt is a 30 y.o. female  Chief Complaint  Patient presents with   Constipation    Pt c/o constipation x 3 1/2 weeks.  Pt has tried Miralax along with increase of water and currently taking Metamucil QD.    HPI: Debra Pruitt is a 30 y.o. female patient who complains of harder stools, more difficult to pass BM, and less frequent BM x 3.5-4wks. She has to strain have BM. She notes recurrence of hemorrhoids.  Pt has tried drinking more water (48-50oz per day), miralax, metamucil. No n/v. Appetite is ok.  Her baseline x years is 1 BM per week and has to strain to have BM.  Past Medical History:  Diagnosis Date   Anemia    Anxiety    Bacterial vaginosis    Low back pain    Ovarian cyst    Peripheral neuropathy    Vitamin D deficiency     Past Surgical History:  Procedure Laterality Date   CESAREAN SECTION  04/30/2013   MULTIPLE TOOTH EXTRACTIONS      Social History   Socioeconomic History   Marital status: Married    Spouse name: Not on file   Number of children: 1   Years of education: Some coll   Highest education level: Not on file  Occupational History   Occupation: PCA  Tobacco Use   Smoking status: Never   Smokeless tobacco: Never  Vaping Use   Vaping Use: Never used  Substance and Sexual Activity   Alcohol use: Yes    Alcohol/week: 0.0 standard drinks    Comment: Rare - social   Drug use: No   Sexual activity: Yes    Birth control/protection: Injection  Other Topics Concern   Not on file  Social History Narrative   Lives at home with boyfriend and son.   Right-handed.   2 cups caffeine per day.   Social Determinants of Health   Financial Resource Strain: Not on file  Food Insecurity: Not on file  Transportation Needs: Not on file  Physical Activity: Not on file  Stress: Not on file  Social Connections: Not on file  Intimate Partner Violence: Not on file    Family History  Problem Relation Age of Onset   Hypertension Mother     Thyroid disease Mother    Healthy Father    Hypertension Maternal Grandmother    Asthma Maternal Grandmother    Hypertension Paternal Grandmother    COPD Paternal Grandmother    Heart disease Paternal Grandmother    Hyperlipidemia Paternal Grandmother    Stroke Paternal Grandmother    Diabetes Paternal Grandmother    Hepatitis C Paternal Grandmother    Rheum arthritis Paternal Grandmother      Immunization History  Administered Date(s) Administered   Influenza,inj,Quad PF,6+ Mos 04/09/2013   Influenza-Unspecified 04/09/2013   PPD Test 10/27/2011, 12/03/2012, 06/26/2016    Outpatient Encounter Medications as of 12/09/2020  Medication Sig   ibuprofen (ADVIL) 800 MG tablet Take 1 tablet (800 mg total) by mouth 3 (three) times daily.   zonisamide (ZONEGRAN) 100 MG capsule Take 4 capsules every night (Patient not taking: Reported on 12/09/2020)   [DISCONTINUED] benzonatate (TESSALON) 100 MG capsule Take 1 capsule (100 mg total) by mouth 2 (two) times daily as needed for cough. (Patient not taking: Reported on 10/20/2020)   [DISCONTINUED] fluticasone (FLONASE) 50 MCG/ACT nasal spray Place 1-2 sprays into both nostrils daily.   No facility-administered encounter medications on file as of 12/09/2020.  ROS: Pertinent positives and negatives noted in HPI. Remainder of ROS non-contributory   Allergies  Allergen Reactions   Mangifera Indica Swelling   Mango Flavor Swelling   Pineapple Swelling   Other Hives, Itching and Rash    Goli Nutrition supplement    BP 102/70 (BP Location: Left Arm, Patient Position: Sitting, Cuff Size: Normal)   Pulse 87   Temp (!) 97.4 F (36.3 C) (Temporal)   Ht 5\' 6"  (1.676 m)   Wt 163 lb 6.4 oz (74.1 kg)   LMP 12/01/2020   SpO2 98%   BMI 26.37 kg/m  Wt Readings from Last 3 Encounters:  12/09/20 163 lb 6.4 oz (74.1 kg)  10/20/20 167 lb 6.4 oz (75.9 kg)  05/31/20 168 lb (76.2 kg)   Temp Readings from Last 3 Encounters:  12/09/20 (!) 97.4 F  (36.3 C) (Temporal)  06/09/20 98.1 F (36.7 C) (Oral)  05/24/20 (!) 97.3 F (36.3 C) (Temporal)   BP Readings from Last 3 Encounters:  12/09/20 102/70  10/20/20 106/72  06/09/20 117/80   Pulse Readings from Last 3 Encounters:  12/09/20 87  10/20/20 78  06/09/20 100     Physical Exam Constitutional:      General: She is not in acute distress.    Appearance: She is not ill-appearing.  Pulmonary:     Effort: No respiratory distress.  Abdominal:     General: There is no distension.  Neurological:     Mental Status: She is alert and oriented to person, place, and time.  Psychiatric:        Mood and Affect: Mood normal.        Behavior: Behavior normal.     A/P:  1. Slow transit constipation - increase water intake, dietary fiber, regular exercise - miralax 1 cap BID along with colace BID x 3 days. If not having large Bms, then 1 dose of dulcolax - once pt had has multiple soft, large Bms, stop above and continue with daily miralax 1 capful (or can titrate to goal) with goal of at least 1 soft, easy to pass BM per day Discussed plan and reviewed medications with patient, including risks, benefits, and potential side effects. Pt expressed understand. All questions answered.   This visit occurred during the SARS-CoV-2 public health emergency.  Safety protocols were in place, including screening questions prior to the visit, additional usage of staff PPE, and extensive cleaning of exam room while observing appropriate contact time as indicated for disinfecting solutions.

## 2021-01-11 ENCOUNTER — Ambulatory Visit: Payer: Self-pay

## 2021-01-11 ENCOUNTER — Other Ambulatory Visit: Payer: Self-pay

## 2021-01-11 ENCOUNTER — Encounter: Payer: Self-pay | Admitting: Family Medicine

## 2021-01-11 ENCOUNTER — Ambulatory Visit (INDEPENDENT_AMBULATORY_CARE_PROVIDER_SITE_OTHER): Payer: 59 | Admitting: Family Medicine

## 2021-01-11 VITALS — BP 100/72 | Ht 65.0 in | Wt 165.0 lb

## 2021-01-11 DIAGNOSIS — M222X2 Patellofemoral disorders, left knee: Secondary | ICD-10-CM | POA: Diagnosis not present

## 2021-01-11 DIAGNOSIS — M222X1 Patellofemoral disorders, right knee: Secondary | ICD-10-CM

## 2021-01-11 NOTE — Assessment & Plan Note (Signed)
Symptoms most consistent with patellofemoral pain given it being acute on chronic in nature and with weakness on hip abduction.  No structural changes appreciated on ultrasound. -Counseled on home exercise therapy and supportive therapy -Counseled on ibuprofen. -Could consider physical therapy

## 2021-01-11 NOTE — Progress Notes (Signed)
Debra Pruitt - 30 y.o. female MRN [id number]  Date of birth: 1990-09-17  SUBJECTIVE:  Including CC & ROS.  No chief complaint on file.   Debra Pruitt is a 30 y.o. female that is presenting with bilateral knee pain.  It is acute on chronic in nature.  Is occurring when she gets up from a seated position or going up and down stairs.  No injury..   Review of Systems See HPI   HISTORY: Past Medical, Surgical, Social, and Family History Reviewed & Updated per EMR.   Pertinent Historical Findings include:  Past Medical History:  Diagnosis Date   Anemia    Anxiety    Bacterial vaginosis    Low back pain    Ovarian cyst    Peripheral neuropathy    Vitamin D deficiency     Past Surgical History:  Procedure Laterality Date   CESAREAN SECTION  04/30/2013   MULTIPLE TOOTH EXTRACTIONS      Family History  Problem Relation Age of Onset   Hypertension Mother    Thyroid disease Mother    Healthy Father    Hypertension Maternal Grandmother    Asthma Maternal Grandmother    Hypertension Paternal Grandmother    COPD Paternal Grandmother    Heart disease Paternal Grandmother    Hyperlipidemia Paternal Grandmother    Stroke Paternal Grandmother    Diabetes Paternal Grandmother    Hepatitis C Paternal Grandmother    Rheum arthritis Paternal Grandmother     Social History   Socioeconomic History   Marital status: Married    Spouse name: Not on file   Number of children: 1   Years of education: Some coll   Highest education level: Not on file  Occupational History   Occupation: PCA  Tobacco Use   Smoking status: Never   Smokeless tobacco: Never  Vaping Use   Vaping Use: Never used  Substance and Sexual Activity   Alcohol use: Yes    Alcohol/week: 0.0 standard drinks    Comment: Rare - social   Drug use: No   Sexual activity: Yes    Birth control/protection: Injection  Other Topics Concern   Not on file  Social History Narrative   Lives at home with boyfriend and  son.   Right-handed.   2 cups caffeine per day.   Social Determinants of Health   Financial Resource Strain: Not on file  Food Insecurity: Not on file  Transportation Needs: Not on file  Physical Activity: Not on file  Stress: Not on file  Social Connections: Not on file  Intimate Partner Violence: Not on file     PHYSICAL EXAM:  VS: BP 100/72 (BP Location: Left Arm, Patient Position: Sitting, Cuff Size: Normal)   Ht '5\' 5"'$  (1.651 m)   Wt 165 lb (74.8 kg)   BMI 27.46 kg/m  Physical Exam Gen: NAD, alert, cooperative with exam, well-appearing MSK:  Right and left knee: No effusion. Normal range of motion. Weakness with hip abduction. Neurovascularly intact  Limited ultrasound: Right knee, left knee:  Left knee: No effusion suprapatellar pouch. Normal-appearing quadricep patellar tendon. Normal-appearing medial compartment and lateral compartment  Right knee: No effusion suprapatellar pouch. Normal-appearing quadricep patellar tendon. Normal-appearing medial compartment and lateral compartment  Summary: No structural changes appreciated  Ultrasound and interpretation by Clearance Coots, MD    ASSESSMENT & PLAN:   Patellofemoral pain syndrome of both knees Symptoms most consistent with patellofemoral pain given it being acute on chronic in nature and with  weakness on hip abduction.  No structural changes appreciated on ultrasound. -Counseled on home exercise therapy and supportive therapy -Counseled on ibuprofen. -Could consider physical therapy

## 2021-01-11 NOTE — Patient Instructions (Signed)
Good to see you Please try ice as needed  Please try the exercises  Please use the ibuprofen for 3-4 days straight and then as needed  Please send me a message in MyChart with any questions or updates.  Please see me back in 4 weeks.   --Dr. Raeford Razor

## 2021-02-11 ENCOUNTER — Ambulatory Visit: Payer: 59 | Admitting: Family Medicine

## 2021-02-11 NOTE — Progress Notes (Deleted)
  Debra Pruitt - 30 y.o. female MRN [id number]  Date of birth: July 05, 1990  SUBJECTIVE:  Including CC & ROS.  No chief complaint on file.   Debra Pruitt is a 30 y.o. female that is  ***.  ***   Review of Systems See HPI   HISTORY: Past Medical, Surgical, Social, and Family History Reviewed & Updated per EMR.   Pertinent Historical Findings include:  Past Medical History:  Diagnosis Date   Anemia    Anxiety    Bacterial vaginosis    Low back pain    Ovarian cyst    Peripheral neuropathy    Vitamin D deficiency     Past Surgical History:  Procedure Laterality Date   CESAREAN SECTION  04/30/2013   MULTIPLE TOOTH EXTRACTIONS      Family History  Problem Relation Age of Onset   Hypertension Mother    Thyroid disease Mother    Healthy Father    Hypertension Maternal Grandmother    Asthma Maternal Grandmother    Hypertension Paternal Grandmother    COPD Paternal Grandmother    Heart disease Paternal Grandmother    Hyperlipidemia Paternal Grandmother    Stroke Paternal Grandmother    Diabetes Paternal Grandmother    Hepatitis C Paternal Grandmother    Rheum arthritis Paternal Grandmother     Social History   Socioeconomic History   Marital status: Married    Spouse name: Not on file   Number of children: 1   Years of education: Some coll   Highest education level: Not on file  Occupational History   Occupation: PCA  Tobacco Use   Smoking status: Never   Smokeless tobacco: Never  Vaping Use   Vaping Use: Never used  Substance and Sexual Activity   Alcohol use: Yes    Alcohol/week: 0.0 standard drinks    Comment: Rare - social   Drug use: No   Sexual activity: Yes    Birth control/protection: Injection  Other Topics Concern   Not on file  Social History Narrative   Lives at home with boyfriend and son.   Right-handed.   2 cups caffeine per day.   Social Determinants of Health   Financial Resource Strain: Not on file  Food Insecurity: Not on  file  Transportation Needs: Not on file  Physical Activity: Not on file  Stress: Not on file  Social Connections: Not on file  Intimate Partner Violence: Not on file     PHYSICAL EXAM:  VS: There were no vitals taken for this visit. Physical Exam Gen: NAD, alert, cooperative with exam, well-appearing MSK:  ***      ASSESSMENT & PLAN:   No problem-specific Assessment & Plan notes found for this encounter.

## 2021-02-15 ENCOUNTER — Telehealth (INDEPENDENT_AMBULATORY_CARE_PROVIDER_SITE_OTHER): Payer: 59 | Admitting: Family Medicine

## 2021-02-15 ENCOUNTER — Other Ambulatory Visit: Payer: Self-pay

## 2021-02-15 DIAGNOSIS — M222X1 Patellofemoral disorders, right knee: Secondary | ICD-10-CM | POA: Diagnosis not present

## 2021-02-15 DIAGNOSIS — M222X2 Patellofemoral disorders, left knee: Secondary | ICD-10-CM

## 2021-02-16 NOTE — Progress Notes (Signed)
Virtual Visit via Video Note  I connected with Debra Pruitt on 02/16/21 at  3:10 PM EDT by a video enabled telemedicine application and verified that I am speaking with the correct person using two identifiers.  Location: Patient: work Provider: office   I discussed the limitations of evaluation and management by telemedicine and the availability of in person appointments. The patient expressed understanding and agreed to proceed.  History of Present Illness:  [name] is a 30 year old female that is following up for her bilateral knee pain.  She has some minor pain but has felt improvement since doing home exercises.   Observations/Objective:   Assessment and Plan:  Patellofemoral pain syndrome of both knees: Pain is improved with home therapy thus far.  Still having mild pain intermittently. -Counseled on home exercise therapy and supportive care. -Referral to physical therapy. -Could consider injection further imaging.  Follow Up Instructions:    I discussed the assessment and treatment plan with the patient. The patient was provided an opportunity to ask questions and all were answered. The patient agreed with the plan and demonstrated an understanding of the instructions.   The patient was advised to call back or seek an in-person evaluation if the symptoms worsen or if the condition fails to improve as anticipated.    Clearance Coots, MD

## 2021-02-16 NOTE — Assessment & Plan Note (Signed)
Pain is improved with home therapy thus far.  Still having mild pain intermittently. -Counseled on home exercise therapy and supportive care. -Referral to physical therapy. -Could consider injection further imaging.

## 2021-02-24 ENCOUNTER — Telehealth: Payer: Self-pay

## 2021-02-24 ENCOUNTER — Telehealth (INDEPENDENT_AMBULATORY_CARE_PROVIDER_SITE_OTHER): Payer: 59 | Admitting: Neurology

## 2021-02-24 ENCOUNTER — Other Ambulatory Visit: Payer: Self-pay

## 2021-02-24 ENCOUNTER — Encounter: Payer: Self-pay | Admitting: Neurology

## 2021-02-24 VITALS — Ht 65.0 in | Wt 165.0 lb

## 2021-02-24 DIAGNOSIS — G40009 Localization-related (focal) (partial) idiopathic epilepsy and epileptic syndromes with seizures of localized onset, not intractable, without status epilepticus: Secondary | ICD-10-CM

## 2021-02-24 DIAGNOSIS — G43009 Migraine without aura, not intractable, without status migrainosus: Secondary | ICD-10-CM

## 2021-02-24 MED ORDER — NURTEC 75 MG PO TBDP: ORAL_TABLET | ORAL | 5 refills | Status: DC

## 2021-02-24 MED ORDER — ZONISAMIDE 100 MG PO CAPS: ORAL_CAPSULE | ORAL | 11 refills | Status: DC

## 2021-02-24 NOTE — Telephone Encounter (Signed)
New message  Debra Pruitt (Key: [id number]) Rx #: [id number] Nurtec 75MG  dispersible tablets   Form MedImpact ePA Form 2017 NCPDP Created 4 hours ago Sent to Plan 5 minutes ago Plan Response 5 minutes ago Submit Clinical Questions 1 minute ago Determination Favorable less than a minute ago Message from Plan The request has been approved. The authorization is effective for a maximum of 6 fills from 02/24/2021 to 08/23/2021, as long as the member is enrolled in their current health plan. The request was approved with a quantity restriction. This has been approved for a quantity limit of 18 with a day supply limit of 30. A written notification letter will follow with additional details.

## 2021-02-24 NOTE — Progress Notes (Signed)
She is going to let you know if the nurtec or ubrelvy helped better she as to call her husband to get him to tell her the name,

## 2021-02-24 NOTE — Patient Instructions (Signed)
Good to see you! Continue Zonisamide 400mg  every night. Refills for Nurtec were also sent. Please keep a calendar of your symptoms. Follow-up in 4-5 months, call for any changes.   Seizure Precautions: 1. If medication has been prescribed for you to prevent seizures, take it exactly as directed.  Do not stop taking the medicine without talking to your doctor first, even if you have not had a seizure in a long time.   2. Avoid activities in which a seizure would cause danger to yourself or to others.  Don't operate dangerous machinery, swim alone, or climb in high or dangerous places, such as on ladders, roofs, or girders.  Do not drive unless your doctor says you may.  3. If you have any warning that you may have a seizure, lay down in a safe place where you can't hurt yourself.    4.  No driving for 6 months from last seizure, as per [facility].   Please refer to the following link on the [place] website for more information: http://www.epilepsyfoundation.org/answerplace/Social/driving/drivingu.cfm   5.  Maintain good sleep hygiene. Avoid alcohol.  6.  Notify your neurology if you are planning pregnancy or if you become pregnant.  7.  Contact your doctor if you have any problems that may be related to the medicine you are taking.  8.  Call 911 and bring the patient back to the ED if:        A.  The seizure lasts longer than 5 minutes.       B.  The patient doesn't awaken shortly after the seizure  C.  The patient has new problems such as difficulty seeing, speaking or moving  D.  The patient was injured during the seizure  E.  The patient has a temperature over 102 F (39C)  F.  The patient vomited and now is having trouble breathing

## 2021-02-24 NOTE — Progress Notes (Signed)
Virtual Visit via Video Note The purpose of this virtual visit is to provide medical care while limiting exposure to the novel coronavirus.    Consent was obtained for video visit:  Yes.   Answered questions that patient had about telehealth interaction:  Yes.   I discussed the limitations, risks, security and privacy concerns of performing an evaluation and management service by telemedicine. I also discussed with the patient that there may be a patient responsible charge related to this service. The patient expressed understanding and agreed to proceed.  Pt location: Home Physician Location: office Name of referring provider:  Ronnald Nian, DO I connected with Debra Pruitt at patients initiation/request on 02/24/2021 at 10:00 AM EDT by video enabled telemedicine application and verified that I am speaking with the correct person using two identifiers. Pt MRN:  [id number] Pt DOB:  [birth year] Video Participants:  Debra Pruitt   History of Present Illness:  The patient had a virtual video visit on 02/24/2021. She was last seen in the neurology clinic 4 months ago for seizures and migraines. She had a likely nocturnal seizure with urinary incontinence in May 2022 after stopping Zonisamide suddenly when pharmacy called about recall. She has been taking Zonisamide 400mg  qhs since then with no further episodes of waking up with urinary incontinence, husband has not mentioned any nocturnal seizures. She may still have some random bruising that she did not know how they happened. She still notices zoning out episodes during the day, occurring a couple of times a week. They are less than a minute, one time she was driving at was a stoplight then saw the light was green. Her husband was in the car but did not mention any odd behaviors. She has had a few migraines, occurring around once a week. She had one last night and just lay down. She tried Nurtec and [place] and found that Nurtec helped  better with migraine rescue. She usually gets 6-8 hours of sleep. Mood is okay. No falls. She provides additional information that her mother has migraines.    History on Initial Assessment 02/10/2020: This is a 30 year old right-handed woman presenting to establish care for seizures. She had previously been seeing [facility] at [facility], records were reviewed. She was initially evaluated in April 2020 for nocturnal shaking episodes. She states that looking back, she has had unexplained episodes since childhood where she would wake up with bruises, feeling extremely tired, tongue felt weird. When younger, she would wake up with urinary incontinence. This has not occurred in the 4 years they have been together. She reported these would occur around once a month. She had an MRI brain with and without contrast in May 2020 which was normal, hippocampi symmetric. She had a 72-hour EEG in June 2020 which was normal, however typical events were not captured. She was started on Topiramate for seizure and migraine prophylaxis however this made her feel depressed/"like I was having a breakdown." She was switched to Aptiom 800mg  daily, she does not think it helped, and it was also cost-prohibitive, so she stopped it after her last visit with her prior neurologist in April 2021. Her husband has witnessed 3 nocturnal seizures this year, he would feel shaking, the last episode she was making a humming sort of noise. They only last a few seconds, he puts her hand on her and it stops. She would be a little bit groggy, and would say she is sore then go back to sleep.  She still wakes up with unexplained bruises and feeling weird, last occurrence was a couple of weeks ago. No clear triggers. After her visit with Neurology, she was asking about zoning out, and has noticed that in the past year, she would have times where she is talking then zones out, she comes back and asks what happened. For that moment of time she is  not there. There is no clear warning. He states she responds when he alerts her, these do not occur frequently. She has occasional body jerks/twitches. She denies any olfactory/gustatory hallucinations, rising epigastric sensation, focal numbness/tingling/weakness. Her maternal uncle and distant cousin have seizures. Otherwise she had a normal birth and early development.  There is no history of febrile convulsions, CNS infections such as meningitis/encephalitis, significant traumatic brain injury, neurosurgical procedures.  She has had migraines since her 80s, usually starting in the frontal region then radiating diffusely. She is sensitive to lights and sounds, no nausea/vomiting. Migraines can occur every 1-2 weeks, lasting a few minutes to all day. She was tried on amitriptyline but this caused drowsiness, she stopped it in May. She was given prn Baclofen and sumatriptan but made her feel weird. Excedrin and Ibuprofen do not help. She does not think they helped with the migraines. Her mother has migraines. She reports her mood is fine. She had a fall in January. A year ago her legs gave out on her when she stood up, she could not feel her legs for a few minutes. No pregnancy plans, she is s/p tubal ligation.  Diagnostic Data: MRI brain with and without contrast in May 2020 was normal, hippocampi symmetric.  72-hour EEG in June 2020 which was normal, however typical events were not captured  Prior migraine rescue medications: sumatriptan, Maxalt, Baclofen, Excedrin, Ibuprofen   Current Outpatient Medications on File Prior to Visit  Medication Sig Dispense Refill   ibuprofen (ADVIL) 800 MG tablet Take 1 tablet (800 mg total) by mouth 3 (three) times daily. 21 tablet 0   zonisamide (ZONEGRAN) 100 MG capsule Take 4 capsules every night 120 capsule 11   No current facility-administered medications on file prior to visit.     Observations/Objective:   Vitals:   02/24/21 0932  Weight: 165 lb (74.8  kg)  Height: 5\' 5"  (1.651 m)   GEN:  The patient appears stated age and is in NAD.  Neurological examination: Patient is awake, alert. No aphasia or dysarthria. Intact fluency and comprehension. Remote and recent memory intact. Cranial nerves: Extraocular movements intact with no nystagmus. No facial asymmetry. Motor: moves all extremities symmetrically, at least anti-gravity x 4.    Assessment and Plan:   This is a 30 yo RH woman with a history of migraines and recurrent nocturnal seizures since childhood, as well as episodes of zoning out during the day. Her MRI brain and 72-hour EEG were normal, however typical events were not captured. Seizures suggestive of focal to bilateral tonic-clonic epilepsy, etiology unknown. No further nocturnal seizures since 09/2020 now that she is back on Zonisamide 400mg  qhs. She continues to report very brief zoning out episodes, we discussed increasing Zonisamide to 500mg  qhs but she would like to hold off for now. She was advised to keep a calendar of her symptoms. We again discussed  driving laws to stop driving after an episode of loss of awareness until 6 months seizure-free. She has had good response to Nurtec, refills will be sent for migraine rescue. Follow-up in 4-5 months, call for any changes.  Follow Up Instructions:   -I discussed the assessment and treatment plan with the patient. The patient was provided an opportunity to ask questions and all were answered. The patient agreed with the plan and demonstrated an understanding of the instructions.   The patient was advised to call back or seek an in-person evaluation if the symptoms worsen or if the condition fails to improve as anticipated.    Cameron Sprang, MD

## 2021-03-01 ENCOUNTER — Encounter: Payer: Self-pay | Admitting: Physical Therapy

## 2021-03-01 ENCOUNTER — Other Ambulatory Visit: Payer: Self-pay

## 2021-03-01 ENCOUNTER — Ambulatory Visit: Payer: 59 | Admitting: Physical Therapy

## 2021-03-01 DIAGNOSIS — G8929 Other chronic pain: Secondary | ICD-10-CM

## 2021-03-01 DIAGNOSIS — M25562 Pain in left knee: Secondary | ICD-10-CM

## 2021-03-01 DIAGNOSIS — M6281 Muscle weakness (generalized): Secondary | ICD-10-CM | POA: Diagnosis not present

## 2021-03-01 DIAGNOSIS — R2689 Other abnormalities of gait and mobility: Secondary | ICD-10-CM

## 2021-03-01 DIAGNOSIS — M25561 Pain in right knee: Secondary | ICD-10-CM

## 2021-03-01 NOTE — Therapy (Signed)
[place], [place], [address] Phone: [phone]   Fax:  [phone]  Physical Therapy Evaluation  Patient Details  Name: Debra Pruitt MRN: [id number] Date of Birth: 1991-02-24 Referring Provider (PT): schmitz, jeremy   Encounter Date: 03/01/2021   PT End of Session - 03/01/21 1143     Visit Number 1    Number of Visits 6    Date for PT Re-Evaluation 04/12/21    PT Start Time 1105    PT Stop Time 1142    PT Time Calculation (min) 37 min    Activity Tolerance Patient tolerated treatment well    Behavior During Therapy [facility] for tasks assessed/performed             Past Medical History:  Diagnosis Date   Anemia    Anxiety    Bacterial vaginosis    Low back pain    Ovarian cyst    Peripheral neuropathy    Vitamin D deficiency     Past Surgical History:  Procedure Laterality Date   CESAREAN SECTION  04/30/2013   MULTIPLE TOOTH EXTRACTIONS      There were no vitals filed for this visit.    Subjective Assessment - 03/01/21 1111     Subjective Pt states that at times especially when going up/down stairs she feels like her "knee is going to go out". Both knees are affected Rt > Lt. Feeling can also occur after prolonged standing.    Limitations Walking    Diagnostic tests ultrasound negative for tears    Patient Stated Goals improve strength in knees and hips    Currently in Pain? No/denies                [facility] PT Assessment - 03/01/21 0001       Assessment   Medical Diagnosis patellafemoral pain syndrome bilat    Referring Provider (PT) schmitz, jeremy    Onset Date/Surgical Date --   "years ago"   Next MD Visit PRN    Prior Therapy none      Precautions   Precautions None      Balance Screen   Has the patient fallen in the past 6 months Yes    How many times? 2    Has the patient had a decrease in activity level because of a fear of falling?  No    Is the  patient reluctant to leave their home because of a fear of falling?  No      Prior Function   Level of Independence Independent      Observation/Other Assessments   Focus on Therapeutic Outcomes (FOTO)  not assessed      Functional Tests   Functional tests Squat;Step up;Step down      Squat   Comments no pain, good patellar alignment      Step Up   Comments pt avoids pressure on Rt knee with stepping up      Step Down   Comments pt shifts wt to Lt LE to avoid Rt LE pressure in step down      ROM / Strength   AROM / PROM / Strength AROM;Strength      AROM   Overall AROM Comments bilat knee [facility]      Strength   Strength Assessment Site Hip;Knee    Right/Left Hip Right;Left    Right Hip Flexion 4/5    Right Hip ABduction 3+/5    Left Hip Flexion  4/5    Left Hip ABduction 3+/5    Right/Left Knee Right;Left    Right Knee Flexion 3+/5    Right Knee Extension 4+/5    Left Knee Flexion 3+/5    Left Knee Extension 4+/5      Palpation   Patella mobility pain with medial glides      Special Tests   Other special tests Ober (+) Lt                        Objective measurements completed on examination: See above findings.       [place] Adult PT Treatment/Exercise - 03/01/21 0001       Exercises   Exercises Knee/Hip      Knee/Hip Exercises: Stretches   ITB Stretch 2 reps;Both;20 seconds      Knee/Hip Exercises: Standing   Forward Step Up 10 reps    Forward Step Up Limitations 4 inch step    Step Down 10 reps    Step Down Limitations 4 inche step      Knee/Hip Exercises: Supine   Straight Leg Raises 10 reps    Straight Leg Raises Limitations bilat    Straight Leg Raise with External Rotation 10 reps    Straight Leg Raise with External Rotation Limitations bilat      Knee/Hip Exercises: Sidelying   Hip ABduction 10 reps    Hip ABduction Limitations bilat    Clams 10 reps bilat                     PT Education - 03/01/21 1137      Education Details PT POC and goals, HEP    Person(s) Educated Patient    Methods Explanation;Demonstration;Handout    Comprehension Returned demonstration;Verbalized understanding                 PT Long Term Goals - 03/01/21 1146       PT LONG TERM GOAL #1   Title Pt will be independent with HEP    Time 6    Period Weeks    Status New    Target Date 04/12/21      PT LONG TERM GOAL #2   Title Pt will improve bilat LE strength to 4+/5 to improve gait and stair tolerance    Time 6    Period Weeks    Status New    Target Date 04/12/21      PT LONG TERM GOAL #3   Title Pt will negotiate stairs with normalized alternating gait pattern without UE support with pain <= 1/10    Time 6    Period Weeks    Status New    Target Date 04/12/21                    Plan - 03/01/21 1144     Clinical Impression Statement Pt is a 29 y/o female referred for patellofemoral pain syndrome. Pt presents with decreased strength, impaired gait and mobility and increased pain and will benefit from skilled PT to address deficits and improve funcitonal mobility    Personal Factors and Comorbidities Time since onset of injury/illness/exacerbation    Examination-Activity Limitations Stairs;Locomotion Level;Stand    Stability/Clinical Decision Making Stable/Uncomplicated    Clinical Decision Making Low    Rehab Potential Good    PT Frequency 1x / week    PT Duration 6 weeks    PT Treatment/Interventions Cryotherapy;Aquatic Therapy;Electrical Stimulation;Iontophoresis  4mg /ml Dexamethasone;Moist Heat;Stair training;Gait training;Therapeutic exercise;Therapeutic activities;Balance training;Neuromuscular re-education;Patient/family education;Manual techniques;Dry needling;Taping;Vasopneumatic Device    PT Next Visit Plan assess HEP, functional knee strength and stability, balance!    PT Home Exercise Plan [email or web address]             Patient will benefit from skilled therapeutic intervention  in order to improve the following deficits and impairments:  Pain, Decreased strength, Decreased activity tolerance, Difficulty walking, Decreased balance  Visit Diagnosis: Chronic pain of left knee - Plan: PT plan of care cert/re-cert  Chronic pain of right knee - Plan: PT plan of care cert/re-cert  Other abnormalities of gait and mobility - Plan: PT plan of care cert/re-cert  Muscle weakness (generalized) - Plan: PT plan of care cert/re-cert     Problem List Patient Active Problem List   Diagnosis Date Noted   Patellofemoral pain syndrome of both knees 01/11/2021   Cervical radiculopathy 05/31/2020   Capsulitis of toe of right foot 02/23/2020   Vitamin D deficiency 01/09/2020   Closed nondisplaced fracture of distal phalanx of great toe with routine healing, subsequent encounter 11/13/2019    Lyncoln Maskell, PT 03/01/2021, 11:50 AM  [facility] [place], [place], [address] Phone: [phone]   Fax:  [phone]  Name: Debra Pruitt MRN: [id number] Date of Birth: [birth year]

## 2021-03-01 NOTE — Patient Instructions (Signed)
Access Code: [email or web address] URL: [email or web address] Date: 03/01/2021 Prepared by: Isabelle Course  Exercises Supine ITB Stretch with Strap - 1 x daily - 7 x weekly - 3 sets - 1 reps - 20-30 sec hold Standing ITB Stretch - 1 x daily - 7 x weekly - 3 sets - 1 reps - 20-30 sec hold Active Straight Leg Raise with Quad Set - 1 x daily - 7 x weekly - 3 sets - 10 reps Straight Leg Raise with External Rotation - 1 x daily - 7 x weekly - 3 sets - 10 reps Sidelying Hip Abduction - 1 x daily - 7 x weekly - 3 sets - 10 reps Clamshell - 1 x daily - 7 x weekly - 3 sets - 10 reps Step Up - 1 x daily - 7 x weekly - 3 sets - 10 reps Forward Step Down - 1 x daily - 7 x weekly - 3 sets - 10 reps

## 2021-03-04 NOTE — Progress Notes (Deleted)
NEW PATIENT Date of Service/Encounter:  03/04/21 Referring provider: none-self referred Primary care provider: Pcp, No  Subjective:  Nya Monds is a 30 y.o. female with a PMHx of cervical radiculopathy and vitamin D deficiency presenting today for evaluation of *** History obtained from: chart review and {Persons; PED relatives w/patient:19415::"patient"}.   *** Other allergy screening: Asthma: {Blank single:19197::"yes","no"} Rhino conjunctivitis: {Blank single:19197::"yes","no"} Food allergy: {Blank single:19197::"yes","no"} Medication allergy: {Blank single:19197::"yes","no"} Hymenoptera allergy: {Blank single:19197::"yes","no"} Urticaria: {Blank single:19197::"yes","no"} Eczema:{Blank single:19197::"yes","no"} History of recurrent infections suggestive of immunodeficency: {Blank single:19197::"yes","no"} ***Vaccinations are up to date.   Past Medical History: Past Medical History:  Diagnosis Date   Anemia    Anxiety    Bacterial vaginosis    Low back pain    Ovarian cyst    Peripheral neuropathy    Vitamin D deficiency    Medication List:  Current Outpatient Medications  Medication Sig Dispense Refill   ibuprofen (ADVIL) 800 MG tablet Take 1 tablet (800 mg total) by mouth 3 (three) times daily. 21 tablet 0   Rimegepant Sulfate (NURTEC) 75 MG TBDP Take 1 tablet as needed at onset of migraine. May take second dose after 24 hours. Do not take more than 3 a week 10 tablet 5   zonisamide (ZONEGRAN) 100 MG capsule Take 4 capsules every night 120 capsule 11   No current facility-administered medications for this visit.   Known Allergies:  Allergies  Allergen Reactions   Mangifera Indica Swelling   Mango Flavor Swelling   Pineapple Swelling   Other Hives, Itching and Rash    Goli Nutrition supplement   Past Surgical History: Past Surgical History:  Procedure Laterality Date   CESAREAN SECTION  04/30/2013   MULTIPLE TOOTH EXTRACTIONS     Family  History: Family History  Problem Relation Age of Onset   Hypertension Mother    Thyroid disease Mother    Healthy Father    Hypertension Maternal Grandmother    Asthma Maternal Grandmother    Hypertension Paternal Grandmother    COPD Paternal Grandmother    Heart disease Paternal Grandmother    Hyperlipidemia Paternal Grandmother    Stroke Paternal Grandmother    Diabetes Paternal Grandmother    Hepatitis C Paternal Grandmother    Rheum arthritis Paternal Grandmother    Social History: [place] lives ***.   ROS:  All other systems negative except as noted per HPI.  Objective:  unknown if currently breastfeeding. There is no height or weight on file to calculate BMI. Physical Exam:  General Appearance:  Alert, cooperative, no distress, appears stated age  Head:  Normocephalic, without obvious abnormality, atraumatic  Eyes:  Conjunctiva clear, EOM's intact  Nose: Nares normal  Throat: Lips, tongue normal; teeth and gums normal  Neck: Supple, symmetrical  Lungs:   Respirations unlabored, no coughing  Heart:  Appears well perfused  Extremities: No edema  Skin: Skin color, texture, turgor normal, no rashes or lesions on visualized portions of skin  Neurologic: No gross deficits   Diagnostics: Spirometry:  Tracings reviewed. Her effort: {Blank single:19197::"Good reproducible efforts.","It was hard to get consistent efforts and there is a question as to whether this reflects a maximal maneuver.","Poor effort, data can not be interpreted."} FVC: ***L FEV1: ***L, ***% predicted FEV1/FVC ratio: ***% Interpretation: {Blank single:19197::"Spirometry consistent with mild obstructive disease","Spirometry consistent with moderate obstructive disease","Spirometry consistent with severe obstructive disease","Spirometry consistent with possible restrictive disease","Spirometry consistent with mixed obstructive and restrictive disease","Spirometry uninterpretable due to  technique","Spirometry consistent with normal pattern","No overt abnormalities  noted given today's efforts"}.  Please see scanned spirometry results for details.  Skin Testing: {Blank single:19197::"Select foods","Environmental allergy panel","Environmental allergy panel and select foods","Food allergy panel","None","Deferred due to recent antihistamines use"}. Positive test to: ***. Negative test to: ***.  Results discussed with patient/family.   {Blank single:19197::"Allergy testing results were read and interpreted by myself, documented by clinical staff."," "}  Assessment:  No diagnosis found. Plan/Recommendations:  There are no Patient Instructions on file for this visit.  No follow-ups on file.  {Blank single:19197::"This note in its entirety was forwarded to the Provider who requested this consultation."}  Thank you for your kind referral. I appreciate the opportunity to take part in Barbra's care. Please do not hesitate to contact me with questions.***  Sincerely,  Sigurd Sos, MD All[facility] and [place] [facility] [place]

## 2021-03-07 ENCOUNTER — Ambulatory Visit: Payer: 59 | Admitting: Internal Medicine

## 2021-03-07 NOTE — Progress Notes (Signed)
NEW PATIENT Date of Service/Encounter:  03/10/21 Referring provider: none-self referred Primary care provider: Pcp, No  Subjective:  Debra Pruitt is a 30 y.o. female with a PMHx of vitamin D deficiency presenting today for evaluation of allergic rhinitis and concern for food/medication allergy History obtained from: chart review and patient.   Chronic rhinitis: started years ago Symptoms include:  eyes burn on occasion, nasal congestion, rhinorrhea, and sneezing  Occurs seasonally-Fall and Spring Potential triggers: unidentified Treatments tried: Cetrizine helps, has not tried eyedrops or nasal sprays Previous allergy testing: no History of reflux/heartburn: no  Mangos and pineapples make her feel like her mouth is cut and swollen.  Lips do not become tingly.  This only happens with fresh fruit or fruit cups.  She is able to eat canned pineapple without any symptoms.  Has not tried mango besides in the fresh form.  Ashwanda golgi vitamins -after eating 1 of these, developed small bumps over her face and scalp which were extremely itchy within 1 hour of ingestion.  She did not have any other symptoms, but this rash lasted for a few days.  She ended up having to take prednisone from her PCP.  She has not had this vitamin since.  The ingredients of the vitamin include mixed berries and an ashwaganda.  Other allergy screening: Asthma: no Medication allergy:  morphine made her itch (adverse effect) Hymenoptera allergy: no Urticaria: no Eczema:no History of recurrent infections suggestive of immunodeficency: no Vaccinations are up to date.   Past Medical History: Past Medical History:  Diagnosis Date   Anemia    Angio-edema    Anxiety    Bacterial vaginosis    Low back pain    Ovarian cyst    Peripheral neuropathy    Urticaria    Vitamin D deficiency    Medication List:  Current Outpatient Medications  Medication Sig Dispense Refill   cetirizine (ZYRTEC) 10 MG tablet  Take 1 tablet (10 mg total) by mouth daily. 30 tablet 5   fluticasone (FLONASE) 50 MCG/ACT nasal spray Place 2 sprays into both nostrils daily. 18.2 mL 6   ibuprofen (ADVIL) 800 MG tablet Take 1 tablet (800 mg total) by mouth 3 (three) times daily. 21 tablet 0   ipratropium (ATROVENT) 0.03 % nasal spray Place 2 sprays into both nostrils 3 (three) times daily. 30 mL 12   Rimegepant Sulfate (NURTEC) 75 MG TBDP Take 1 tablet as needed at onset of migraine. May take second dose after 24 hours. Do not take more than 3 a week 10 tablet 5   zonisamide (ZONEGRAN) 100 MG capsule Take 4 capsules every night 120 capsule 11   No current facility-administered medications for this visit.   Known Allergies:  Allergies  Allergen Reactions   Mangifera Indica Swelling   Mango Flavor Swelling   Pineapple Swelling   Other Hives, Itching and Rash    Goli Nutrition supplement   Past Surgical History: Past Surgical History:  Procedure Laterality Date   CESAREAN SECTION  04/30/2013   MULTIPLE TOOTH EXTRACTIONS     Family History: Family History  Problem Relation Age of Onset   Hypertension Mother    Thyroid disease Mother    Healthy Father    Hypertension Maternal Grandmother    Asthma Maternal Grandmother    Hypertension Paternal Grandmother    COPD Paternal Grandmother    Heart disease Paternal Grandmother    Hyperlipidemia Paternal Grandmother    Stroke Paternal Grandmother    Diabetes Paternal  Grandmother    Hepatitis C Paternal Grandmother    Rheum arthritis Paternal Grandmother    Social History: [place] lives in a house built 12 years ago, no water damage, + carpet, electric heating, central AC, no visible pests. Works as a [employer]arry manager. No HEPA filter in home.   ROS:  All other systems negative except as noted per HPI.  Objective:  Blood pressure 116/80, pulse 84, temperature 98.1 F (36.7 C), resp. rate 16, height 5\' 6"  (1.676 m), weight 165 lb 9.6 oz (75.1 kg), SpO2 98 %, unknown if  currently breastfeeding. Body mass index is 26.73 kg/m. Physical Exam:  General Appearance:  Alert, cooperative, no distress, appears stated age  Head:  Normocephalic, without obvious abnormality, atraumatic  Eyes:  Conjunctiva clear, EOM's intact  Nose: Nares normal  Throat: Lips, tongue normal; teeth and gums normal  Neck: Supple, symmetrical  Lungs:   Respirations unlabored, no coughing  Heart:  Appears well perfused  Extremities: No edema  Skin: Skin color, texture, turgor normal, no rashes or lesions on visualized portions of skin  Neurologic: No gross deficits   Diagnostics:  Skin Testing: Environmental allergy panel and select foods. Positive test to: none including histamine. Negative test to: all.  Positive control not adequate..  Results discussed with patient/family.  Airborne Adult Perc - 03/10/21 1409     Time Antigen Placed 1409    Allergen Manufacturer Lavella Hammock    Location Back    Number of Test 59    1. Control-Buffer 50% Glycerol Negative    2. Control-Histamine 1 mg/ml Negative    3. Albumin saline Negative    4. [place] Negative    5. [place] Negative    6. Johnson Negative    7. [place] Blue Negative    8. Meadow Fescue Negative    9. Perennial Rye Negative    10. Sweet Vernal Negative    11. Timothy Negative    12. Cocklebur Negative    13. Burweed Marshelder Negative    14. Ragweed, short Negative    15. Ragweed, Giant Negative    16. Plantain,  English Negative    17. Lamb's Quarters Negative    18. Sheep Sorrell Negative    19. Rough Pigweed Negative    20. Marsh Elder, Rough Negative    21. Mugwort, Common Negative    22. Ash mix Negative    23. Birch mix Negative    24. Beech American Negative    25. Box, Elder Negative    26. Cedar, red Negative    27. Cottonwood, [place] Negative    28. Elm mix Negative    29. Hickory Negative    30. Maple mix Negative    31. Oak, [place] mix Negative    32. Pecan Pollen Negative    33. Pine mix  Negative    34. Sycamore Eastern Negative    35. Bl[place], Black Pollen Negative    36. Alternaria alternata Negative    37. Cladosporium Herbarum Negative    38. Aspergillus mix Negative    39. Penicillium mix Negative    40. Bipolaris sorokiniana (Helminthosporium) Negative    41. Drechslera spicifera (Curvularia) Negative    42. Mucor plumbeus Negative    43. Fusarium moniliforme Negative    44. Aureobasidium pullulans (pullulara) Negative    45. Rhizopus oryzae Negative    46. Botrytis cinera Negative    47. Epicoccum nigrum Negative    48. Phoma betae Negative    49. Candida  Albicans Negative    50. Trichophyton mentagrophytes Negative    51. Mite, D Farinae  5,000 AU/ml Negative    52. Mite, D Pteronyssinus  5,000 AU/ml Negative    53. Cat Hair 10,000 BAU/ml Negative    54.  Dog Epithelia Negative    55. Mixed Feathers Negative    56. Horse Epithelia Negative    57. Cockroach, German Negative    58. Mouse Negative    59. Tobacco Leaf Negative    Comments nnnnn             Food Adult Perc - 03/10/21 1400     Time Antigen Placed 1410    Allergen Manufacturer Lavella Hammock    Location Back    Number of allergen test 2    Control-Histamine 1 mg/ml Negative    63. Pineapple Negative             Allergy testing results were read and interpreted by myself, documented by clinical staff.  Assessment:  Chronic rhinitis - Plan: Allergy Test, ipratropium (ATROVENT) 0.03 % nasal spray, fluticasone (FLONASE) 50 MCG/ACT nasal spray, cetirizine (ZYRTEC) 10 MG tablet, Allergens, Zone 2 - history concerning for allergic rhinitis, but testing today was uniterpretable due to lack of controls, will repeat in serum.  Plan as below.  Hives - Plan: Allergen, Mango, f91, Allergen, Blueberry, Rf288, Allergen, Carolynn Sayers, f211, Allergen, Genevive Bi, [phone], Allergy Test, Allergen, Pineapple, f210 - unclear if reaction following vitamins was allergic or not, but no good way to test this, have  advised avoidance and will test for berry ingredients with serum testing  Concern for allergy to pineapple and mango:  - do not suspect IgE mediated reaction because she is able to tolerate canned pineapple - possible OAS - will confirm with IgE testing Plan/Recommendations:   Patient Instructions  Chronic rhinitis-: - allergy testing today was negative, but your test did not show a good positive control, we will repeat in the blood - we will contact you once we have the results - Start Nasal Steroid Spray: Options include Flonase (fluticasone), Nasocort (triamcinolone), Nasonex (mometasome) 1- 2 sprays in each nostril daily (can buy over-the-counter if not covered by insurance)  Best results if used daily. - Start Atrovent (Ipratropium Bromide) 1-2 sprays in each nostril up to 3 times a day as needed for runny nose/post nasal drip/drainage.   - Continue over the counter antihistamine daily or daily as needed.  Your options include Zyrtec (Cetirizine) 10mg , Claritin (Loratadine) 10mg , Allegra (Fexofenadine) 180mg , or Xyzal (Levocetirinze) 5mg   I have sent in prescriptions for Zyrtec, Flonase, and Atrovent.  Zyrtec and Flonase are over-the-counter and may not be covered.  If they are not, you can use any of the other options listed above.  . Concern for pineapple and mango allergy -Since you are able to tolerate canned pineapple, suspect oral allergy syndrome versus contact allergy -Okay to eat these foods warmed or canned -Otherwise, avoidance to prevent symptoms -This does not usually go on to create systemic symptoms so epinephrine is not needed at this time  Hives after Goli Ashgawanda vitamins -unclear if this was from these vitamins given duration of symptoms, but would advise avoidance - will test for berries since this was part of the ingredients  Follow-up in 4 months.   Sincerely,  Sigurd Sos, MD Allergy and [place] [facility] [place]

## 2021-03-08 ENCOUNTER — Ambulatory Visit (INDEPENDENT_AMBULATORY_CARE_PROVIDER_SITE_OTHER): Payer: 59 | Admitting: Physical Therapy

## 2021-03-08 ENCOUNTER — Other Ambulatory Visit: Payer: Self-pay

## 2021-03-08 DIAGNOSIS — M25562 Pain in left knee: Secondary | ICD-10-CM

## 2021-03-08 DIAGNOSIS — M25561 Pain in right knee: Secondary | ICD-10-CM

## 2021-03-08 DIAGNOSIS — R2689 Other abnormalities of gait and mobility: Secondary | ICD-10-CM

## 2021-03-08 DIAGNOSIS — M6281 Muscle weakness (generalized): Secondary | ICD-10-CM

## 2021-03-08 DIAGNOSIS — G8929 Other chronic pain: Secondary | ICD-10-CM

## 2021-03-08 NOTE — Therapy (Signed)
[place], [place], [address] Phone: [phone]   Fax:  [phone]  Physical Therapy Treatment  Patient Details  Name: Debra Pruitt MRN: [id number] Date of Birth: 1991-02-09 Referring Provider (PT): schmitz, jeremy   Encounter Date: 03/08/2021   PT End of Session - 03/08/21 1229     Visit Number 2    Number of Visits 6    Date for PT Re-Evaluation 04/12/21    PT Start Time 0973    PT Stop Time 1226    PT Time Calculation (min) 38 min    Activity Tolerance Patient tolerated treatment well    Behavior During Therapy [facility] for tasks assessed/performed             Past Medical History:  Diagnosis Date   Anemia    Anxiety    Bacterial vaginosis    Low back pain    Ovarian cyst    Peripheral neuropathy    Vitamin D deficiency     Past Surgical History:  Procedure Laterality Date   CESAREAN SECTION  04/30/2013   MULTIPLE TOOTH EXTRACTIONS      There were no vitals filed for this visit.   Subjective Assessment - 03/08/21 1154     Subjective Pt states no changes since eval. She still has increased pain with stair negotiation    Patient Stated Goals improve strength in knees and hips    Currently in Pain? Yes    Pain Score 2     Pain Location Knee    Pain Orientation Right                [facility] PT Assessment - 03/08/21 0001       Assessment   Medical Diagnosis patellafemoral pain syndrome bilat    Referring Provider (PT) schmitz, jeremy    Next MD Visit PRN    Prior Therapy none      Strength   Left Hip Flexion 4/5                           [facility] Adult PT Treatment/Exercise - 03/08/21 0001       Knee/Hip Exercises: Stretches   Piriformis Stretch Right;Left;30 seconds    Piriformis Stretch Limitations modified pigeon pose      Knee/Hip Exercises: Aerobic   Recumbent Bike L1 x 5 min for warm up      Knee/Hip Exercises: Machines for  Strengthening   Total Gym Leg Press 5 plates x 12 bilat LE      Knee/Hip Exercises: Standing   Forward Step Up Right;Left;10 reps;Step Height: 4";Hand Hold: 0    Step Down Right;Left;Hand Hold: 0;10 reps;Step Height: 4"    SLS with Vectors clock reach x 10 bilat      Knee/Hip Exercises: Supine   Straight Leg Raises 10 reps    Straight Leg Raises Limitations bilat    Straight Leg Raise with External Rotation 10 reps    Straight Leg Raise with External Rotation Limitations bilat      Knee/Hip Exercises: Sidelying   Hip ABduction 20 reps    Hip ABduction Limitations bilat    Clams 20 reps bilat                          PT Long Term Goals - 03/01/21 1146       PT LONG TERM GOAL #1   Title  Pt will be independent with HEP    Time 6    Period Weeks    Status New    Target Date 04/12/21      PT LONG TERM GOAL #2   Title Pt will improve bilat LE strength to 4+/5 to improve gait and stair tolerance    Time 6    Period Weeks    Status New    Target Date 04/12/21      PT LONG TERM GOAL #3   Title Pt will negotiate stairs with normalized alternating gait pattern without UE support with pain <= 1/10    Time 6    Period Weeks    Status New    Target Date 04/12/21                   Plan - 03/08/21 1230     Clinical Impression Statement Pt with fatigue in hip mm during sidelying leg lifts and clams, requires frequent rest breaks. Good performance with SLS and tandem stance on foam. Educated pt on importance of correct form during stair negotiation, pt iwht good understanding    PT Next Visit Plan progress knee stability and strength and endurance, update HEP    PT Home Exercise Plan [email or web address]    Consulted and Agree with Plan of Care Patient             Patient will benefit from skilled therapeutic intervention in order to improve the following deficits and impairments:     Visit Diagnosis: Chronic pain of left knee  Chronic pain of right  knee  Other abnormalities of gait and mobility  Muscle weakness (generalized)     Problem List Patient Active Problem List   Diagnosis Date Noted   Patellofemoral pain syndrome of both knees 01/11/2021   Cervical radiculopathy 05/31/2020   Capsulitis of toe of right foot 02/23/2020   Vitamin D deficiency 01/09/2020   Closed nondisplaced fracture of distal phalanx of great toe with routine healing, subsequent encounter 11/13/2019    Story Vanvranken, PT 03/08/2021, 12:31 PM  [facility] [place] [address] [place], [place], [address] Phone: [phone]   Fax:  [phone]  Name: Debra Pruitt MRN: [id number] Date of Birth: 20-Dec-1990

## 2021-03-10 ENCOUNTER — Ambulatory Visit (INDEPENDENT_AMBULATORY_CARE_PROVIDER_SITE_OTHER): Payer: 59 | Admitting: Internal Medicine

## 2021-03-10 ENCOUNTER — Other Ambulatory Visit: Payer: Self-pay

## 2021-03-10 ENCOUNTER — Encounter: Payer: Self-pay | Admitting: Internal Medicine

## 2021-03-10 VITALS — BP 116/80 | HR 84 | Temp 98.1°F | Resp 16 | Ht 66.0 in | Wt 165.6 lb

## 2021-03-10 DIAGNOSIS — J310 Chronic rhinitis: Secondary | ICD-10-CM | POA: Diagnosis not present

## 2021-03-10 DIAGNOSIS — L509 Urticaria, unspecified: Secondary | ICD-10-CM | POA: Diagnosis not present

## 2021-03-10 DIAGNOSIS — K9049 Malabsorption due to intolerance, not elsewhere classified: Secondary | ICD-10-CM

## 2021-03-10 MED ORDER — IPRATROPIUM BROMIDE 0.03 % NA SOLN: 2.0000 | Freq: Three times a day (TID) | NASAL | 12 refills | Status: DC

## 2021-03-10 MED ORDER — FLUTICASONE PROPIONATE 50 MCG/ACT NA SUSP: 2.0000 | Freq: Every day | NASAL | 6 refills | Status: DC

## 2021-03-10 MED ORDER — CETIRIZINE HCL 10 MG PO TABS: 10.0000 mg | ORAL_TABLET | Freq: Every day | ORAL | 5 refills | Status: DC

## 2021-03-10 NOTE — Patient Instructions (Addendum)
Chronic rhinitis-: - allergy testing today was negative, but your test did not show a good positive control, we will repeat in the blood - we will contact you once we have the results - Start Nasal Steroid Spray: Options include Flonase (fluticasone), Nasocort (triamcinolone), Nasonex (mometasome) 1- 2 sprays in each nostril daily (can buy over-the-counter if not covered by insurance)  Best results if used daily. - Start Atrovent (Ipratropium Bromide) 1-2 sprays in each nostril up to 3 times a day as needed for runny nose/post nasal drip/drainage.   - Continue over the counter antihistamine daily or daily as needed.  Your options include Zyrtec (Cetirizine) 10mg , Claritin (Loratadine) 10mg , Allegra (Fexofenadine) 180mg , or Xyzal (Levocetirinze) 5mg   I have sent in prescriptions for Zyrtec, Flonase, and Atrovent.  Zyrtec and Flonase are over-the-counter and may not be covered.  If they are not, you can use any of the other options listed above.  . Concern for pineapple and mango allergy -Since you are able to tolerate canned pineapple, suspect oral allergy syndrome versus contact allergy -Okay to eat these foods warmed or canned -Otherwise, avoidance to prevent symptoms -This does not usually go on to create systemic symptoms so epinephrine is not needed at this time  Hives after [place] vitamins -unclear if this was from these vitamins given duration of symptoms, but would advise avoidance - will test for berries since this was part of the ingredients  Follow-up in 4 months.

## 2021-03-15 ENCOUNTER — Ambulatory Visit: Payer: 59 | Admitting: Physical Therapy

## 2021-03-15 ENCOUNTER — Other Ambulatory Visit: Payer: Self-pay

## 2021-03-15 DIAGNOSIS — M25562 Pain in left knee: Secondary | ICD-10-CM | POA: Diagnosis not present

## 2021-03-15 DIAGNOSIS — R2689 Other abnormalities of gait and mobility: Secondary | ICD-10-CM

## 2021-03-15 DIAGNOSIS — M6281 Muscle weakness (generalized): Secondary | ICD-10-CM | POA: Diagnosis not present

## 2021-03-15 DIAGNOSIS — M25561 Pain in right knee: Secondary | ICD-10-CM

## 2021-03-15 DIAGNOSIS — G8929 Other chronic pain: Secondary | ICD-10-CM

## 2021-03-15 NOTE — Patient Instructions (Signed)
Access Code: [email or web address] URL: [email or web address] Date: 03/15/2021 Prepared by: Isabelle Course  Exercises Supine ITB Stretch with Strap - 1 x daily - 7 x weekly - 3 sets - 1 reps - 20-30 sec hold Standing ITB Stretch - 1 x daily - 7 x weekly - 3 sets - 1 reps - 20-30 sec hold Straight Leg Raise with External Rotation - 1 x daily - 7 x weekly - 2 sets - 10 reps Sidelying Hip Abduction - 1 x daily - 7 x weekly - 3 sets - 10 reps Clamshell - 1 x daily - 7 x weekly - 3 sets - 10 reps Squat - 1 x daily - 7 x weekly - 2 sets - 10 reps Forward Monster Walks - 1 x daily - 7 x weekly - 2 sets - 10 reps Side Stepping with Resistance at Feet - 1 x daily - 7 x weekly - 2 sets - 10 reps

## 2021-03-15 NOTE — Therapy (Signed)
[place], [place], [address] Phone: [phone]   Fax:  [phone]  Physical Therapy Treatment  Patient Details  Name: Tequita Marrs MRN: [id number] Date of Birth: 12-Mar-1991 Referring Provider (PT): schmitz, jeremy   Encounter Date: 03/15/2021   PT End of Session - 03/15/21 1144     Visit Number 3    Number of Visits 6    Date for PT Re-Evaluation 04/12/21    PT Start Time 1100    PT Stop Time 1140    PT Time Calculation (min) 40 min    Activity Tolerance Patient tolerated treatment well    Behavior During Therapy [facility] for tasks assessed/performed             Past Medical History:  Diagnosis Date   Anemia    Angio-edema    Anxiety    Bacterial vaginosis    Low back pain    Ovarian cyst    Peripheral neuropathy    Urticaria    Vitamin D deficiency     Past Surgical History:  Procedure Laterality Date   CESAREAN SECTION  04/30/2013   MULTIPLE TOOTH EXTRACTIONS      There were no vitals filed for this visit.   Subjective Assessment - 03/15/21 1107     Subjective Pt states "this week has been good" States she has pain with walking on hard floors    Patient Stated Goals improve strength in knees and hips    Currently in Pain? No/denies                [facility] PT Assessment - 03/15/21 0001       Assessment   Medical Diagnosis patellafemoral pain syndrome bilat    Referring Provider (PT) schmitz, jeremy    Next MD Visit PRN    Prior Therapy none      Step Down   Comments able to perform without pain bilat                           [facility] Adult PT Treatment/Exercise - 03/15/21 0001       Knee/Hip Exercises: Stretches   ITB Stretch 3 reps;Both;20 seconds      Knee/Hip Exercises: Aerobic   Recumbent Bike L1 x 5 min for warm up      Knee/Hip Exercises: Machines for Strengthening   Total Gym Leg Press 6 plates x 10 bilat, 4 plates x 10 each  Single leg      Knee/Hip Exercises: Standing   Forward Step Up Right;Left;10 reps;Hand Hold: 0;Step Height: 6"    Step Down Right;Left;Hand Hold: 0;10 reps;Step Height: 6"    Step Down Limitations laterally and forward x 10    Other Standing Knee Exercises squats on bosu x 10    Other Standing Knee Exercises monster walk red TB x 1 lap      Knee/Hip Exercises: Supine   Straight Leg Raise with External Rotation 10 reps    Straight Leg Raise with External Rotation Limitations bilat      Knee/Hip Exercises: Sidelying   Hip ABduction 20 reps    Hip ABduction Limitations bilat    Clams 20 reps bilat                     PT Education - 03/15/21 1144     Education Details updated HEP    Person(s) Educated Patient    Methods  Explanation;Demonstration;Handout    Comprehension Returned demonstration;Verbalized understanding                 PT Long Term Goals - 03/01/21 1146       PT LONG TERM GOAL #1   Title Pt will be independent with HEP    Time 6    Period Weeks    Status New    Target Date 04/12/21      PT LONG TERM GOAL #2   Title Pt will improve bilat LE strength to 4+/5 to improve gait and stair tolerance    Time 6    Period Weeks    Status New    Target Date 04/12/21      PT LONG TERM GOAL #3   Title Pt will negotiate stairs with normalized alternating gait pattern without UE support with pain <= 1/10    Time 6    Period Weeks    Status New    Target Date 04/12/21                   Plan - 03/15/21 1144     Clinical Impression Statement Pt with much improved tolerance to step downs on 6'' step today. Challenged by BOSU squats and single leg leg press. Updated HEP to continue hip and knee strength and stability    PT Next Visit Plan downhill walking, hip and knee strength    PT Home Exercise Plan [email or web address]    Consulted and Agree with Plan of Care Patient             Patient will benefit from skilled therapeutic intervention in  order to improve the following deficits and impairments:     Visit Diagnosis: Chronic pain of left knee  Chronic pain of right knee  Other abnormalities of gait and mobility  Muscle weakness (generalized)     Problem List Patient Active Problem List   Diagnosis Date Noted   Food intolerance 03/10/2021   Chronic rhinitis 03/10/2021   Patellofemoral pain syndrome of both knees 01/11/2021   Cervical radiculopathy 05/31/2020   Capsulitis of toe of right foot 02/23/2020   Vitamin D deficiency 01/09/2020   Closed nondisplaced fracture of distal phalanx of great toe with routine healing, subsequent encounter 11/13/2019    Emmerson Shuffield, PT 03/15/2021, 11:46 AM  [facility] [place], [place], [address] Phone: [phone]   Fax:  [phone]  Name: Isabelle Matt MRN: [id number] Date of Birth: 1990-09-01

## 2021-03-22 ENCOUNTER — Other Ambulatory Visit: Payer: Self-pay

## 2021-03-22 ENCOUNTER — Encounter: Payer: Self-pay | Admitting: Nurse Practitioner

## 2021-03-22 ENCOUNTER — Encounter: Payer: 59 | Admitting: Physical Therapy

## 2021-03-22 ENCOUNTER — Ambulatory Visit
Admission: EM | Admit: 2021-03-22 | Discharge: 2021-03-22 | Disposition: A | Discharge status: Home / Self Care | Payer: 59 | Attending: Emergency Medicine | Admitting: Emergency Medicine

## 2021-03-22 DIAGNOSIS — J9801 Acute bronchospasm: Secondary | ICD-10-CM

## 2021-03-22 DIAGNOSIS — J209 Acute bronchitis, unspecified: Secondary | ICD-10-CM

## 2021-03-22 DIAGNOSIS — Z20828 Contact with and (suspected) exposure to other viral communicable diseases: Secondary | ICD-10-CM

## 2021-03-22 DIAGNOSIS — B349 Viral infection, unspecified: Secondary | ICD-10-CM

## 2021-03-22 DIAGNOSIS — Z8701 Personal history of pneumonia (recurrent): Secondary | ICD-10-CM

## 2021-03-22 MED ORDER — METHYLPREDNISOLONE 4 MG PO TBPK: ORAL_TABLET | ORAL | 0 refills | Status: DC

## 2021-03-22 MED ORDER — AEROCHAMBER PLUS FLO-VU MEDIUM MISC: 1.0000 | Freq: Once | 0 refills | Status: AC

## 2021-03-22 MED ORDER — ALBUTEROL SULFATE HFA 108 (90 BASE) MCG/ACT IN AERS: 1.0000 | INHALATION_SPRAY | Freq: Four times a day (QID) | RESPIRATORY_TRACT | 0 refills | Status: DC | PRN

## 2021-03-22 MED ORDER — AZITHROMYCIN 250 MG PO TABS: 250.0000 mg | ORAL_TABLET | Freq: Every day | ORAL | 0 refills | Status: DC

## 2021-03-22 NOTE — ED Notes (Signed)
Triaged by provider  

## 2021-03-22 NOTE — Discharge Instructions (Signed)
Please continue to use cetirizine and Zyrtec as prescribed.  I provided you with prescriptions for albuterol which should provide you with some relief of the shortness of breath that sensation of burning her chest.  Please take 2 puffs every 4 hours as needed.  I have also provided you with a steroid, please take as prescribed.  I recommend you take an antibiotic as well for prevention of pneumonia given your history.  The results of your COVID and flu testing will be made available to you once they are received, usually in the next 12 to 24 hours.  Below are some recommendations for managing symptoms of upper respiratory illness.  You have a viral upper respiratory illness. Conservative care is the mainstay of treatment for this.  This includes rest, pushing clear fluids and activity as tolerated.  You may also noticed that your appetite is reduced, this is okay as long as you are drinking plenty of clear fluids.  Acetaminophen: This is a good fever reducer.  If your body temperature rises above 101.5 as measured with a thermometer, it is recommended that you take 1000 mg every 8 hours until your temperature falls below 101.5  Ibuprofen: This is a good anti-inflammatory medication which addresses aches and pains and, to some degree, congestion in the nasal passages.  I recommend taking between 200 to 400 mg every 8 hours as needed.  Pseudoephedrine: This is a decongestant.  This medication has to be purchased from the pharmacist counter, I recommend taking 2 tablets, 60 mg, 2-3 times a day as needed to relieve runny nose and sinus drainage.  Guaifenesin: This is an expectorant.  This helps break up chest congestion and loosen up thick nasal drainage making phlegm and drainage more liquid and therefore easier to remove.  I recommend taking 400 mg 3 times daily as needed.  Dextromethorphan: This is a cough suppressant.  This is often recommended to be taken at nighttime to suppress cough and help people  sleep.

## 2021-03-22 NOTE — ED Provider Notes (Signed)
[facility]    CSN: [id number] Arrival date & time: 03/22/21  1030      History   Chief Complaint No chief complaint on file.   HPI Debra Pruitt is a 30 y.o. female.   Patient complains of cough and chest pain pain is a burning sensation and is worse with coughing.  Patient reports a history of allergies, states she is currently not taking any allergy medication.  Vital signs are stable on arrival, patient does have a slightly elevated temperature.  Oxygen saturations are good.  Patient is in no acute acute distress on arrival.  Patient reports a history of pneumonia last year, states this began with symptoms similar to what she is having today, patient states she would like to be checked out to make sure she is not developing pneumonia again.  Patient denies fever, aches, chills, nausea, vomiting, diarrhea, decreased appetite, fatigue.  The history is provided by the patient.   Past Medical History:  Diagnosis Date   Anemia    Angio-edema    Anxiety    Bacterial vaginosis    Low back pain    Ovarian cyst    Peripheral neuropathy    Urticaria    Vitamin D deficiency     Patient Active Problem List   Diagnosis Date Noted   Food intolerance 03/10/2021   Chronic rhinitis 03/10/2021   Patellofemoral pain syndrome of both knees 01/11/2021   Cervical radiculopathy 05/31/2020   Capsulitis of toe of right foot 02/23/2020   Vitamin D deficiency 01/09/2020   Closed nondisplaced fracture of distal phalanx of great toe with routine healing, subsequent encounter 11/13/2019    Past Surgical History:  Procedure Laterality Date   CESAREAN SECTION  04/30/2013   MULTIPLE TOOTH EXTRACTIONS      OB History     Gravida  1   Para      Term      Preterm      AB      Living         SAB      IAB      Ectopic      Multiple      Live Births               Home Medications    Prior to Admission medications   Medication Sig Start Date End Date  Taking? Authorizing Provider  albuterol (VENTOLIN HFA) 108 (90 Base) MCG/ACT inhaler Inhale 1-2 puffs into the lungs every 6 (six) hours as needed for wheezing or shortness of breath. 03/22/21  Yes Lynden Oxford Scales, PA-C  azithromycin (ZITHROMAX) 250 MG tablet Take 1 tablet (250 mg total) by mouth daily. Take first 2 tablets together, then 1 every day until finished. 03/22/21  Yes Lynden Oxford Scales, PA-C  methylPREDNISolone (MEDROL DOSEPAK) 4 MG TBPK tablet Take 24 mg on day 1, 20 mg on day 2, 16 mg on day 3, 12 mg on day 4, 8 mg on day 5, 4 mg on day 6. 03/22/21  Yes Lynden Oxford Scales, PA-C  cetirizine (ZYRTEC) 10 MG tablet Take 1 tablet (10 mg total) by mouth daily. 03/10/21   Sigurd Sos, MD  fluticasone [facility]) 50 MCG/ACT nasal spray Place 2 sprays into both nostrils daily. 03/10/21   Sigurd Sos, MD  ibuprofen (ADVIL) 800 MG tablet Take 1 tablet (800 mg total) by mouth 3 (three) times daily. 06/09/20   Wieters, Hallie C, PA-C  ipratropium (ATROVENT) 0.03 % nasal spray Place  2 sprays into both nostrils 3 (three) times daily. 03/10/21   Sigurd Sos, MD  Rimegepant Sulfate (NURTEC) 75 MG TBDP Take 1 tablet as needed at onset of migraine. May take second dose after 24 hours. Do not take more than 3 a week 02/24/21   Cameron Sprang, MD  zonisamide [facility]) 100 MG capsule Take 4 capsules every night 02/24/21   Cameron Sprang, MD    Family History Family History  Problem Relation Age of Onset   Hypertension Mother    Thyroid disease Mother    Healthy Father    Hypertension Maternal Grandmother    Asthma Maternal Grandmother    Hypertension Paternal Grandmother    COPD Paternal Grandmother    Heart disease Paternal Grandmother    Hyperlipidemia Paternal Grandmother    Stroke Paternal Grandmother    Diabetes Paternal Grandmother    Hepatitis C Paternal Grandmother    Rheum arthritis Paternal Grandmother     Social History Social History   Tobacco Use   Smoking  status: Never   Smokeless tobacco: Never  Vaping Use   Vaping Use: Never used  Substance Use Topics   Alcohol use: Yes    Alcohol/week: 0.0 standard drinks    Comment: Rare - social   Drug use: No     Allergies   Mangifera indica, Mango flavor, Pineapple, and Other   Review of Systems Review of Systems Pertinent findings noted in history of present illness.    Physical Exam Triage Vital Signs ED Triage Vitals  Enc Vitals Group     BP      Pulse      Resp      Temp      Temp src      SpO2      Weight      Height      Head Circumference      Peak Flow      Pain Score      Pain Loc      Pain Edu?      Excl. in [place]?    No data found.  Updated Vital Signs BP 124/81 (BP Location: Right Arm)   Pulse 90   Temp 99.4 F (37.4 C) (Oral)   Resp 18   SpO2 98%   Visual Acuity Right Eye Distance:   Left Eye Distance:   Bilateral Distance:    Right Eye Near:   Left Eye Near:    Bilateral Near:     Physical Exam Vitals and nursing note reviewed.  Constitutional:      General: She is not in acute distress.    Appearance: Normal appearance. She is not ill-appearing.  HENT:     Head: Normocephalic and atraumatic.     Salivary Glands: Right salivary gland is not diffusely enlarged or tender. Left salivary gland is not diffusely enlarged or tender.     Right Ear: Tympanic membrane, ear canal and external ear normal. No drainage. No middle ear effusion. There is no impacted cerumen. Tympanic membrane is not erythematous or bulging.     Left Ear: Tympanic membrane, ear canal and external ear normal. No drainage.  No middle ear effusion. There is no impacted cerumen. Tympanic membrane is not erythematous or bulging.     Nose: Nose normal. No nasal deformity, septal deviation, mucosal edema, congestion or rhinorrhea.     Right Turbinates: Not enlarged, swollen or pale.     Left Turbinates: Not enlarged, swollen or pale.  Right Sinus: No maxillary sinus tenderness or  frontal sinus tenderness.     Left Sinus: No maxillary sinus tenderness or frontal sinus tenderness.     Mouth/Throat:     Lips: Pink. No lesions.     Mouth: Mucous membranes are moist. No oral lesions.     Pharynx: Oropharynx is clear. Uvula midline. No posterior oropharyngeal erythema or uvula swelling.     Tonsils: No tonsillar exudate. 0 on the right. 0 on the left.  Eyes:     General: Lids are normal.        Right eye: No discharge.        Left eye: No discharge.     Extraocular Movements: Extraocular movements intact.     Conjunctiva/sclera: Conjunctivae normal.     Right eye: Right conjunctiva is not injected.     Left eye: Left conjunctiva is not injected.     Pupils: Pupils are equal, round, and reactive to light.  Neck:     Trachea: Trachea and phonation normal.  Cardiovascular:     Rate and Rhythm: Normal rate and regular rhythm.     Pulses: Normal pulses.     Heart sounds: Normal heart sounds. No murmur heard.   No friction rub. No gallop.  Pulmonary:     Effort: Pulmonary effort is normal. No accessory muscle usage, prolonged expiration or respiratory distress.     Breath sounds: Normal breath sounds. Decreased air movement present. No stridor or transmitted upper airway sounds. No decreased breath sounds, wheezing, rhonchi or rales.  Chest:     Chest wall: No tenderness.  Musculoskeletal:        General: Normal range of motion.     Cervical back: Normal range of motion and neck supple. Normal range of motion.  Lymphadenopathy:     Cervical: No cervical adenopathy.  Skin:    General: Skin is warm and dry.     Findings: No erythema or rash.  Neurological:     General: No focal deficit present.     Mental Status: She is alert and oriented to person, place, and time. Mental status is at baseline.  Psychiatric:        Mood and Affect: Mood normal.        Behavior: Behavior normal.     UC Treatments / Results  Labs (all labs ordered are listed, but only abnormal  results are displayed) Labs Reviewed  COVID-19, FLU A+B NAA    EKG   Radiology No results found.  Procedures Procedures (including critical care time)  Medications Ordered in UC Medications - No data to display  Initial Impression / Assessment and Plan / UC Course  I have reviewed the triage vital signs and the nursing notes.  Pertinent labs & imaging results that were available during my care of the patient were reviewed by me and considered in my medical decision making (see chart for details).     Given patient's globally decreased breath sounds on exam as well as her history of pneumonia, I feel it be appropriate to go ahead and treat her with some antibiotics for presumed community-acquired pneumonia.  COVID and flu testing was performed as well, patient advised results will be available to her once received.  I recommend that patient continue to use cetirizine and Flonase for her upper respiratory symptoms and have also provided her with an albuterol inhaler.  She should also take a short course of steroids which should also address some of the symptoms  of bronchospasm that she is experiencing.  Patient verbalized understanding and agreement of plan as discussed.  All questions were addressed during visit.  Please see discharge instructions below for further details of plan.  Final Clinical Impressions(s) / UC Diagnoses   Final diagnoses:  Exposure to influenza  Viral illness  History of pneumonia, recurrent  Bronchospasm  Acute bronchitis, unspecified organism     Discharge Instructions      Please continue to use cetirizine and Zyrtec as prescribed.  I provided you with prescriptions for albuterol which should provide you with some relief of the shortness of breath that sensation of burning her chest.  Please take 2 puffs every 4 hours as needed.  I have also provided you with a steroid, please take as prescribed.  I recommend you take an antibiotic as well for  prevention of pneumonia given your history.  The results of your COVID and flu testing will be made available to you once they are received, usually in the next 12 to 24 hours.  Below are some recommendations for managing symptoms of upper respiratory illness.  You have a viral upper respiratory illness. Conservative care is the mainstay of treatment for this.  This includes rest, pushing clear fluids and activity as tolerated.  You may also noticed that your appetite is reduced, this is okay as long as you are drinking plenty of clear fluids.  Acetaminophen: This is a good fever reducer.  If your body temperature rises above 101.5 as measured with a thermometer, it is recommended that you take 1000 mg every 8 hours until your temperature falls below 101.5  Ibuprofen: This is a good anti-inflammatory medication which addresses aches and pains and, to some degree, congestion in the nasal passages.  I recommend taking between 200 to 400 mg every 8 hours as needed.  Pseudoephedrine: This is a decongestant.  This medication has to be purchased from the pharmacist counter, I recommend taking 2 tablets, 60 mg, 2-3 times a day as needed to relieve runny nose and sinus drainage.  Guaifenesin: This is an expectorant.  This helps break up chest congestion and loosen up thick nasal drainage making phlegm and drainage more liquid and therefore easier to remove.  I recommend taking 400 mg 3 times daily as needed.  Dextromethorphan: This is a cough suppressant.  This is often recommended to be taken at nighttime to suppress cough and help people sleep.      ED Prescriptions     Medication Sig Dispense Auth. Provider   albuterol (VENTOLIN HFA) 108 (90 Base) MCG/ACT inhaler Inhale 1-2 puffs into the lungs every 6 (six) hours as needed for wheezing or shortness of breath. 18 g Lynden Oxford Scales, PA-C   Spacer/Aero-Holding Chambers (AEROCHAMBER PLUS FLO-VU MEDIUM) MISC 1 each by Other route once for 1 dose.  1 each Lynden Oxford Scales, PA-C   methylPREDNISolone (MEDROL DOSEPAK) 4 MG TBPK tablet Take 24 mg on day 1, 20 mg on day 2, 16 mg on day 3, 12 mg on day 4, 8 mg on day 5, 4 mg on day 6. 21 tablet Lynden Oxford Scales, PA-C   azithromycin (ZITHROMAX) 250 MG tablet Take 1 tablet (250 mg total) by mouth daily. Take first 2 tablets together, then 1 every day until finished. 6 tablet Lynden Oxford Scales, PA-C      PDMP not reviewed this encounter.   Lynden Oxford Scales, PA-C 03/23/21 1353

## 2021-03-23 ENCOUNTER — Ambulatory Visit: Payer: Self-pay

## 2021-03-24 LAB — COVID-19, FLU A+B NAA
Influenza A, NAA: NOT DETECTED
Influenza B, NAA: NOT DETECTED
SARS-CoV-2, NAA: NOT DETECTED

## 2021-03-26 LAB — ALLERGENS, ZONE 2

## 2021-03-26 LAB — ALLERGEN, RASPBERRY, RF343: F343-IgE Raspberry: <0.1 kU/L

## 2021-03-26 LAB — ALLERGEN, BLACKBERRY, F211: Blackberry: <0.1 kU/L

## 2021-03-26 LAB — ALLERGEN, MANGO, F91: Mango IgE: <0.1 kU/L

## 2021-03-26 LAB — ALLERGEN, BLUEBERRY, RF288: Allergen Blueberry IgE: <0.1 kU/L

## 2021-03-27 LAB — ALLERGEN, PINEAPPLE, F210: Pineapple IgE: <0.1 kU/L

## 2021-03-28 NOTE — Progress Notes (Signed)
Please let Debra Pruitt know:   Your allergy testing was again negative in the blood meaning your nasal symptoms are nonallergic in nature, but should still respond to the medications we discussed in clinic.  The fruits we tested for (mango, pineapple, blackberry, blueberry, raspberry) were also negative.  Again suspect oral allergy syndrome as we discussed in clinic.  I would continue to strictly avoid the vitamin that made you have hives the other day, but do not think fruit was the cause of your symptoms.

## 2021-03-29 ENCOUNTER — Ambulatory Visit: Payer: 59 | Admitting: Physical Therapy

## 2021-03-29 ENCOUNTER — Other Ambulatory Visit: Payer: Self-pay

## 2021-03-29 DIAGNOSIS — G8929 Other chronic pain: Secondary | ICD-10-CM

## 2021-03-29 DIAGNOSIS — M25562 Pain in left knee: Secondary | ICD-10-CM

## 2021-03-29 DIAGNOSIS — R2689 Other abnormalities of gait and mobility: Secondary | ICD-10-CM

## 2021-03-29 DIAGNOSIS — M25561 Pain in right knee: Secondary | ICD-10-CM | POA: Diagnosis not present

## 2021-03-29 DIAGNOSIS — M6281 Muscle weakness (generalized): Secondary | ICD-10-CM | POA: Diagnosis not present

## 2021-03-29 NOTE — Patient Instructions (Signed)
Access Code: [email or web address] URL: [email or web address] Date: 03/29/2021 Prepared by: Isabelle Course  Exercises Supine ITB Stretch with Strap - 1 x daily - 7 x weekly - 3 sets - 1 reps - 20-30 sec hold Standing ITB Stretch - 1 x daily - 7 x weekly - 3 sets - 1 reps - 20-30 sec hold Straight Leg Raise with External Rotation - 1 x daily - 7 x weekly - 2 sets - 10 reps Sidelying Hip Abduction - 1 x daily - 7 x weekly - 3 sets - 10 reps Clamshell - 1 x daily - 7 x weekly - 3 sets - 10 reps Squat - 1 x daily - 7 x weekly - 2 sets - 10 reps Forward Monster Walks - 1 x daily - 7 x weekly - 2 sets - 10 reps Side Stepping with Resistance at Feet - 1 x daily - 7 x weekly - 2 sets - 10 reps Standing Terminal Knee Extension with Resistance - 1 x daily - 7 x weekly - 2 sets - 10 reps

## 2021-03-29 NOTE — Therapy (Signed)
[facility] [address] [place], [place], [address] Phone: [phone]   Fax:  [phone]  Physical Therapy Treatment  Patient Details  Name: Debra Pruitt MRN: [id number] Date of Birth: 03/07/1991 Referring Provider (PT): schmitz, jeremy   Encounter Date: 03/29/2021   PT End of Session - 03/29/21 1055     Visit Number 4    Number of Visits 6    Date for PT Re-Evaluation 04/12/21    PT Start Time 2409    PT Stop Time 1055    PT Time Calculation (min) 40 min    Activity Tolerance Patient tolerated treatment well    Behavior During Therapy [facility] for tasks assessed/performed             Past Medical History:  Diagnosis Date   Anemia    Angio-edema    Anxiety    Bacterial vaginosis    Low back pain    Ovarian cyst    Peripheral neuropathy    Urticaria    Vitamin D deficiency     Past Surgical History:  Procedure Laterality Date   CESAREAN SECTION  04/30/2013   MULTIPLE TOOTH EXTRACTIONS      There were no vitals filed for this visit.   Subjective Assessment - 03/29/21 1024     Subjective Pt states she had pneumonia last week so she has been resting alot. She states she still haves slight pain with coming down the stairs    Patient Stated Goals improve strength in knees and hips    Currently in Pain? Yes    Pain Score 2     Pain Location Knee    Pain Orientation Right                [facility] PT Assessment - 03/29/21 0001       Assessment   Medical Diagnosis patellafemoral pain syndrome bilat    Referring Provider (PT) schmitz, jeremy    Next MD Visit PRN    Prior Therapy none      Strength   Right Hip Flexion 4/5    Right Hip ABduction 4/5    Left Hip Flexion 4+/5    Left Hip ABduction 4+/5                           [facility] Adult PT Treatment/Exercise - 03/29/21 0001       Ambulation/Gait   Gait Comments downhill walking 1.3 mph x 2 minutes with no increase in  pain, good gait mechanics with cuing for equal wt bearing      Knee/Hip Exercises: Stretches   ITB Stretch 3 reps;Both;20 seconds      Knee/Hip Exercises: Aerobic   Tread Mill 2.5 min 2.3 mph      Knee/Hip Exercises: Machines for Strengthening   Total Gym Leg Press 6 plates x 10 bilat, 4 plates x 10 each Single leg      Knee/Hip Exercises: Standing   Terminal Knee Extension Right;15 reps    Theraband Level (Terminal Knee Extension) Level 2 (Red)    Step Down Right;Left;Hand Hold: 0;10 reps;Step Height: 6"    Step Down Limitations laterally and forward x 10    Other Standing Knee Exercises squats on bosu x 10    Other Standing Knee Exercises step ups onto BOSU with opposite hip flexion (runners step up)      Knee/Hip Exercises: Supine   Straight Leg Raise with External Rotation  10 reps    Straight Leg Raise with External Rotation Limitations bilat                     PT Education - 03/29/21 1054     Education Details updated HEP    Person(s) Educated Patient    Methods Explanation;Demonstration;Handout    Comprehension Verbalized understanding;Returned demonstration                 PT Long Term Goals - 03/01/21 1146       PT LONG TERM GOAL #1   Title Pt will be independent with HEP    Time 6    Period Weeks    Status New    Target Date 04/12/21      PT LONG TERM GOAL #2   Title Pt will improve bilat LE strength to 4+/5 to improve gait and stair tolerance    Time 6    Period Weeks    Status New    Target Date 04/12/21      PT LONG TERM GOAL #3   Title Pt will negotiate stairs with normalized alternating gait pattern without UE support with pain <= 1/10    Time 6    Period Weeks    Status New    Target Date 04/12/21                   Plan - 03/29/21 1055     Clinical Impression Statement Pt wiht improved strength and good tolerance to downhill walking today. She continues to require cues for proper mechanics with step downs and  BOSU squats to avoid compensation by LT LE to protect Rt LE. Terminal knee extension added to HEP to focus on Rt quad control and endurance    PT Next Visit Plan plan for d/c, FOTO, update HEP    [place] and Agree with Plan of Care Patient             Patient will benefit from skilled therapeutic intervention in order to improve the following deficits and impairments:     Visit Diagnosis: Chronic pain of left knee  Chronic pain of right knee  Other abnormalities of gait and mobility  Muscle weakness (generalized)     Problem List Patient Active Problem List   Diagnosis Date Noted   Food intolerance 03/10/2021   Chronic rhinitis 03/10/2021   Patellofemoral pain syndrome of both knees 01/11/2021   Cervical radiculopathy 05/31/2020   Capsulitis of toe of right foot 02/23/2020   Vitamin D deficiency 01/09/2020   Closed nondisplaced fracture of distal phalanx of great toe with routine healing, subsequent encounter 11/13/2019    Raunel Dimartino, PT 03/29/2021, [place] [place], [place], [address] Phone: [phone]   Fax:  [phone]  Name: Debra Pruitt MRN: [id number] Date of Birth: 28-Nov-1990

## 2021-04-05 ENCOUNTER — Encounter: Payer: 59 | Admitting: Physical Therapy

## 2021-04-11 ENCOUNTER — Other Ambulatory Visit: Payer: Self-pay

## 2021-04-11 ENCOUNTER — Encounter: Payer: Self-pay | Admitting: Nurse Practitioner

## 2021-04-11 ENCOUNTER — Ambulatory Visit (INDEPENDENT_AMBULATORY_CARE_PROVIDER_SITE_OTHER): Payer: 59 | Admitting: Nurse Practitioner

## 2021-04-11 VITALS — BP 102/60 | HR 86 | Temp 97.2°F | Ht 65.0 in | Wt 169.0 lb

## 2021-04-11 DIAGNOSIS — J209 Acute bronchitis, unspecified: Secondary | ICD-10-CM | POA: Diagnosis not present

## 2021-04-11 MED ORDER — FLUTICASONE PROPIONATE HFA 110 MCG/ACT IN AERO: 1.0000 | INHALATION_SPRAY | Freq: Two times a day (BID) | RESPIRATORY_TRACT | 1 refills | Status: DC

## 2021-04-11 NOTE — Patient Instructions (Addendum)
Continue albuterol as needed for chest tightness and SOB Start flovent as prescribed Rinse mouth after each use. Call office if no improvement in 2-4weeks. Sign medical release to get records from [facility].  Acute Bronchitis, Adult Acute bronchitis is sudden inflammation of the main airways (bronchi) that come off the windpipe (trachea) in the lungs. The swelling causes the airways to get smaller and make more mucus than normal. This can make it hard to breathe and can cause coughing or noisy breathing (wheezing). Acute bronchitis may last several weeks. The cough may last longer. Allergies, asthma, and exposure to smoke may make the condition worse. What are the causes? This condition can be caused by germs and by substances that irritate the lungs, including: Cold and flu viruses. The most common cause of this condition is the virus that causes the common cold. Bacteria. This is less common. Breathing in substances that irritate the lungs, including: Smoke from cigarettes and other forms of tobacco. Dust and pollen. Fumes from household cleaning products, gases, or burned fuel. Indoor or outdoor air pollution. What increases the risk? The following factors may make you more likely to develop this condition: A weak body's defense system, also called the immune system. A condition that affects your lungs and breathing, such as asthma. What are the signs or symptoms? Common symptoms of this condition include: Coughing. This may bring up clear, yellow, or green mucus from your lungs (sputum). Wheezing. Runny or stuffy nose. Having too much mucus in your lungs (chest congestion). Shortness of breath. Aches and pains, including sore throat or chest. How is this diagnosed? This condition is usually diagnosed based on: Your symptoms and medical history. A physical exam. You may also have other tests, including tests to rule out other conditions, such as pneumonia. These tests  include: A test of lung function. Test of a mucus sample to look for the presence of bacteria. Tests to check the oxygen level in your blood. Blood tests. Chest X-ray. How is this treated? Most cases of acute bronchitis clear up over time without treatment. Your health care provider may recommend: Drinking more fluids to help thin your mucus so it is easier to cough up. Taking inhaled medicine (inhaler) to improve air flow in and out of your lungs. Using a vaporizer or a humidifier. These are machines that add water to the air to help you breathe better. Taking a medicine that thins mucus and clears congestion (expectorant). Taking a medicine that prevents or stops coughing (cough suppressant). It is notcommon to take an antibiotic medicine for this condition. Follow these instructions at home:  Take over-the-counter and prescription medicines only as told by your health care provider. Use an inhaler, vaporizer, or humidifier as told by your health care provider. Take two teaspoons (10 mL) of honey at bedtime to lessen coughing at night. Drink enough fluid to keep your urine pale yellow. Do not use any products that contain nicotine or tobacco. These products include cigarettes, chewing tobacco, and vaping devices, such as e-cigarettes. If you need help quitting, ask your health care provider. Get plenty of rest. Return to your normal activities as told by your health care provider. Ask your health care provider what activities are safe for you. Keep all follow-up visits. This is important. How is this prevented? To lower your risk of getting this condition again: Wash your hands often with soap and water for at least 20 seconds. If soap and water are not available, use hand sanitizer.  Avoid contact with people who have cold symptoms. Try not to touch your mouth, nose, or eyes with your hands. Avoid breathing in smoke or chemical fumes. Breathing smoke or chemical fumes will make your  condition worse. Get the flu shot every year. Contact a health care provider if: Your symptoms do not improve after 2 weeks. You have trouble coughing up the mucus. Your cough keeps you awake at night. You have a fever. Get help right away if you: Cough up blood. Feel pain in your chest. Have severe shortness of breath. Faint or keep feeling like you are going to faint. Have a severe headache. Have a fever or chills that get worse. These symptoms may represent a serious problem that is an emergency. Do not wait to see if the symptoms will go away. Get medical help right away. Call your local emergency services (911 in the [place] Do not drive yourself to the hospital. Summary Acute bronchitis is inflammation of the main airways (bronchi) that come off the windpipe (trachea) in the lungs. The swelling causes the airways to get smaller and make more mucus than normal. Drinking more fluids can help thin your mucus so it is easier to cough up. Take over-the-counter and prescription medicines only as told by your health care provider. Do not use any products that contain nicotine or tobacco. These products include cigarettes, chewing tobacco, and vaping devices, such as e-cigarettes. If you need help quitting, ask your health care provider. Contact a health care provider if your symptoms do not improve after 2 weeks. This information is not intended to replace advice given to you by your health care provider. Make sure you discuss any questions you have with your health care provider. Document Revised: 09/15/2020 Document Reviewed: 09/15/2020 Elsevier Patient Education  [place].

## 2021-04-11 NOTE — Progress Notes (Signed)
Subjective:  Patient ID: Debra Pruitt, female    DOB: [birth year]  Age: 30 y.o. MRN: [id number]  CC: Acute Visit (Pt states she recently got over pneumonia and she has been having some breathing issues ever since. Pt c/o SOB that comes and goes. )  Shortness of Breath This is a new problem. The current episode started 1 to 4 weeks ago. The problem occurs intermittently. The problem has been waxing and waning. Pertinent negatives include no abdominal pain, chest pain, claudication, coryza, ear pain, fever, headaches, hemoptysis, leg pain, leg swelling, neck pain, orthopnea, PND, rash, rhinorrhea, sore throat, sputum production, swollen glands, syncope, vomiting or wheezing. The symptoms are aggravated by weather changes and any activity. The patient has no known risk factors for DVT/PE. She has tried oral steroids for the symptoms. The treatment provided significant relief. Her past medical history is significant for allergies and pneumonia. There is no history of aspirin allergies, asthma, bronchiolitis, CAD, chronic lung disease, COPD, DVT, a heart failure, PE or a recent surgery.  CXR completed by [facility] clinic. Request records. Completed azithromycin, medrol dose pack, rocephin and depomedrol injection.  Reviewed past Medical, Social and Family history today.  Outpatient Medications Prior to Visit  Medication Sig Dispense Refill   fluticasone (FLONASE) 50 MCG/ACT nasal spray Place 2 sprays into both nostrils daily. 18.2 mL 6   ibuprofen (ADVIL) 800 MG tablet Take 1 tablet (800 mg total) by mouth 3 (three) times daily. 21 tablet 0   Rimegepant Sulfate (NURTEC) 75 MG TBDP Take 1 tablet as needed at onset of migraine. May take second dose after 24 hours. Do not take more than 3 a week 10 tablet 5   albuterol (VENTOLIN HFA) 108 (90 Base) MCG/ACT inhaler Inhale 1-2 puffs into the lungs every 6 (six) hours as needed for wheezing or shortness of breath. 18 g 0   azithromycin (ZITHROMAX) 250 MG tablet  Take 1 tablet (250 mg total) by mouth daily. Take first 2 tablets together, then 1 every day until finished. (Patient not taking: Reported on 04/11/2021) 6 tablet 0   cetirizine (ZYRTEC) 10 MG tablet Take 1 tablet (10 mg total) by mouth daily. (Patient not taking: Reported on 04/11/2021) 30 tablet 5   ipratropium (ATROVENT) 0.03 % nasal spray Place 2 sprays into both nostrils 3 (three) times daily. (Patient not taking: Reported on 04/11/2021) 30 mL 12   methylPREDNISolone (MEDROL DOSEPAK) 4 MG TBPK tablet Take 24 mg on day 1, 20 mg on day 2, 16 mg on day 3, 12 mg on day 4, 8 mg on day 5, 4 mg on day 6. (Patient not taking: Reported on 04/11/2021) 21 tablet 0   zonisamide (ZONEGRAN) 100 MG capsule Take 4 capsules every night (Patient not taking: Reported on 04/11/2021) 120 capsule 11   No facility-administered medications prior to visit.    ROS See HPI  Objective:  BP 102/60 (BP Location: Left Arm, Patient Position: Sitting, Cuff Size: Normal)   Pulse 86   Temp (!) 97.2 F (36.2 C) (Temporal)   Ht 5\' 5"  (1.651 m)   Wt 169 lb (76.7 kg)   SpO2 98%   BMI 28.12 kg/m   Physical Exam Cardiovascular:     Rate and Rhythm: Normal rate and regular rhythm.     Pulses: Normal pulses.     Heart sounds: Normal heart sounds.  Pulmonary:     Effort: Pulmonary effort is normal.     Breath sounds: Normal breath sounds.  Neurological:  Mental Status: She is alert and oriented to person, place, and time.   Assessment & Plan:  This visit occurred during the SARS-CoV-2 public health emergency.  Safety protocols were in place, including screening questions prior to the visit, additional usage of staff PPE, and extensive cleaning of exam room while observing appropriate contact time as indicated for disinfecting solutions.   Avika was seen today for acute visit.  Diagnoses and all orders for this visit:  Acute bronchitis, unspecified organism -     fluticasone (FLOVENT HFA) 110 MCG/ACT  inhaler; Inhale 1 puff into the lungs 2 (two) times daily. Rinse mouth after each use.   Problem List Items Addressed This Visit   None Visit Diagnoses     Acute bronchitis, unspecified organism    -  Primary   Relevant Medications   fluticasone (FLOVENT HFA) 110 MCG/ACT inhaler       Follow-up: Return for maintain upcoming appt.  Wilfred Lacy, NP

## 2021-04-12 ENCOUNTER — Ambulatory Visit (INDEPENDENT_AMBULATORY_CARE_PROVIDER_SITE_OTHER): Payer: 59 | Admitting: Physical Therapy

## 2021-04-12 DIAGNOSIS — M6281 Muscle weakness (generalized): Secondary | ICD-10-CM

## 2021-04-12 DIAGNOSIS — M25561 Pain in right knee: Secondary | ICD-10-CM | POA: Diagnosis not present

## 2021-04-12 DIAGNOSIS — M25562 Pain in left knee: Secondary | ICD-10-CM | POA: Diagnosis not present

## 2021-04-12 DIAGNOSIS — R2689 Other abnormalities of gait and mobility: Secondary | ICD-10-CM

## 2021-04-12 DIAGNOSIS — G8929 Other chronic pain: Secondary | ICD-10-CM

## 2021-04-12 NOTE — Patient Instructions (Signed)
Access Code: [email or web address] URL: [email or web address] Date: 04/12/2021 Prepared by: Isabelle Course  Exercises Supine ITB Stretch with Strap - 1 x daily - 7 x weekly - 3 sets - 1 reps - 20-30 sec hold Straight Leg Raise with External Rotation - 1 x daily - 7 x weekly - 2 sets - 10 reps Squat - 1 x daily - 7 x weekly - 2 sets - 10 reps Forward Monster Walks - 1 x daily - 7 x weekly - 2 sets - 10 reps Side Stepping with Resistance at Feet - 1 x daily - 7 x weekly - 2 sets - 10 reps Standing Terminal Knee Extension with Resistance - 1 x daily - 7 x weekly - 2 sets - 10 reps Lateral Step Down - 1 x daily - 7 x weekly - 2 sets - 10 reps Forward Step Down - 1 x daily - 7 x weekly - 2 sets - 10 reps

## 2021-04-12 NOTE — Therapy (Signed)
[facility] [place], [place], [address] Phone: [phone]   Fax:  [phone]  Physical Therapy Treatment and Discharge  Patient Details  Name: Debra Pruitt MRN: [id number] Date of Birth: [birth year] Referring Provider (PT): schmitz, jeremy   Encounter Date: 04/12/2021   PT End of Session - 04/12/21 1143     Visit Number 5    Number of Visits 6    Date for PT Re-Evaluation 04/12/21    PT Start Time 1115   pt arrived late   PT Stop Time 1145    PT Time Calculation (min) 30 min    Activity Tolerance Patient tolerated treatment well    Behavior During Therapy [facility] for tasks assessed/performed             Past Medical History:  Diagnosis Date   Anemia    Angio-edema    Anxiety    Bacterial vaginosis    Low back pain    Ovarian cyst    Peripheral neuropathy    Urticaria    Vitamin D deficiency     Past Surgical History:  Procedure Laterality Date   CESAREAN SECTION  04/30/2013   MULTIPLE TOOTH EXTRACTIONS      There were no vitals filed for this visit.   Subjective Assessment - 04/12/21 1117     Subjective Pt states she has been having less pain. She still feels it when she goes down the stairs but has been focusing on step ups at home    Patient Stated Goals improve strength in knees and hips    Currently in Pain? No/denies                [facility] PT Assessment - 04/12/21 0001       Assessment   Medical Diagnosis patellafemoral pain syndrome bilat    Referring Provider (PT) schmitz, jeremy    Next MD Visit PRN    Prior Therapy none      Strength   Right Hip Flexion 4/5    Right Hip ABduction 4+/5    Left Hip Flexion 4+/5    Left Hip ABduction 4+/5                           [facility] Adult PT Treatment/Exercise - 04/12/21 0001       Knee/Hip Exercises: Stretches   ITB Stretch 3 reps;Both;20 seconds      Knee/Hip Exercises: Aerobic   Recumbent Bike L4 x  3 min for warm up      Knee/Hip Exercises: Standing   Terminal Knee Extension Left;Right;20 reps    Theraband Level (Terminal Knee Extension) Level 2 (Red)    Step Down Right;Left;Hand Hold: 0;10 reps;Step Height: 6"    Step Down Limitations laterally and forward x 10    Other Standing Knee Exercises squats x 10    Other Standing Knee Exercises monster walk red TB, sidestep red TB      Knee/Hip Exercises: Supine   Straight Leg Raise with External Rotation 10 reps    Straight Leg Raise with External Rotation Limitations bilat                     PT Education - 04/12/21 1143     Education Details updated HEP, plan for d/c    Person(s) Educated Patient    Methods Explanation;Demonstration;Handout    Comprehension Verbalized understanding;Returned demonstration  PT Long Term Goals - 04/12/21 1118       PT LONG TERM GOAL #1   Title Pt will be independent with HEP    Status Achieved      PT LONG TERM GOAL #2   Title Pt will improve bilat LE strength to 4+/5 to improve gait and stair tolerance    Baseline Rt hip flexion 4/5    Status Partially Met      PT LONG TERM GOAL #3   Title Pt will negotiate stairs with normalized alternating gait pattern without UE support with pain <= 1/10    Status Achieved                   Plan - 04/12/21 1144     Clinical Impression Statement Pt has improved strength and functional mobility. She is able to negotiate stairs with good form and decreased pain. Pt is comfortable and ready to d/c to HEP    PT Next Visit Plan d/c    PT Home Exercise Plan [email or web address]    Consulted and Agree with Plan of Care Patient             Patient will benefit from skilled therapeutic intervention in order to improve the following deficits and impairments:     Visit Diagnosis: Chronic pain of left knee  Chronic pain of right knee  Other abnormalities of gait and mobility  Muscle weakness  (generalized)     Problem List Patient Active Problem List   Diagnosis Date Noted   Food intolerance 03/10/2021   Chronic rhinitis 03/10/2021   Patellofemoral pain syndrome of both knees 01/11/2021   Cervical radiculopathy 05/31/2020   Capsulitis of toe of right foot 02/23/2020   Vitamin D deficiency 01/09/2020   Closed nondisplaced fracture of distal phalanx of great toe with routine healing, subsequent encounter 11/13/2019  PHYSICAL THERAPY DISCHARGE SUMMARY  Visits from Start of Care: 5  Current functional level related to goals / functional outcomes: Improved strength and activity tolerance   Remaining deficits: pain   Education / Equipment: HEP   Patient agrees to discharge. Patient goals were partially met. Patient is being discharged due to being pleased with the current functional level.   Irena Gaydos, PT 04/12/2021, 11:45 AM  [facility] [place], [place], [address] Phone: [phone]   Fax:  [phone]  Name: Debra Pruitt MRN: [id number] Date of Birth: [birth year]

## 2021-04-14 ENCOUNTER — Telehealth: Payer: 59 | Admitting: Family Medicine

## 2021-04-14 DIAGNOSIS — B354 Tinea corporis: Secondary | ICD-10-CM | POA: Diagnosis not present

## 2021-04-14 MED ORDER — KETOCONAZOLE 2 % EX CREA: 1.0000 "application " | TOPICAL_CREAM | Freq: Two times a day (BID) | CUTANEOUS | 0 refills | Status: DC

## 2021-04-14 NOTE — Patient Instructions (Signed)
Body Ringworm Body ringworm is an infection of the skin that often causes a ring-shaped rash. Body ringworm is also called tinea corporis. Body ringworm can affect any part of your skin. This condition is easily spread from person to person (is very contagious). What are the causes? This condition is caused by fungi called dermatophytes. The condition develops when these fungi grow out of control on the skin. You can get this condition if you touch a person or animal that has it. You can also get it if you share any items with an infected person or pet. These include: Clothing, bedding, and towels. Brushes or combs. Gym equipment. Any other object that has the fungus on it. What increases the risk? You are more likely to develop this condition if you: Play sports that involve close physical contact, such as wrestling. Sweat a lot. Live in areas that are hot and humid. Use public showers. Have a weakened immune system. What are the signs or symptoms? Symptoms of this condition include: Itchy, raised red spots and bumps. Red scaly patches. A ring-shaped rash. The rash may have: A clear center. Scales or red bumps at its center. Redness near its borders. Dry and scaly skin on or around it. How is this diagnosed? This condition can usually be diagnosed with a skin exam. A skin scraping may be taken from the affected area and examined under a microscope to see if the fungus is present. How is this treated? This condition may be treated with: An antifungal cream or ointment. An antifungal shampoo. Antifungal medicines. These may be prescribed if your ringworm: Is severe. Keeps coming back. Lasts a long time. Follow these instructions at home: Take over-the-counter and prescription medicines only as told by your health care provider. If you were given an antifungal cream or ointment: Use it as told by your health care provider. Wash the infected area and dry it completely before  applying the cream or ointment. If you were given an antifungal shampoo: Use it as told by your health care provider. Leave the shampoo on your body for 3-5 minutes before rinsing. While you have a rash: Wear loose clothing to stop clothes from rubbing and irritating it. Wash or change your bed sheets every night. Disinfect or throw out items that may be infected. Wash clothes and bed sheets in hot water. Wash your hands often with soap and water. If soap and water are not available, use hand sanitizer. If your pet has the same infection, take your pet to see a veterinarian for treatment. How is this prevented? Take a bath or shower every day and after every time you work out or play sports. Dry your skin completely after bathing. Wear sandals or shoes in public places and showers. Change your clothes every day. Wash athletic clothes after each use. Do not share personal items with others. Avoid touching red patches of skin on other people. Avoid touching pets that have bald spots. If you touch an animal that has a bald spot, wash your hands. Contact a health care provider if: Your rash continues to spread after 7 days of treatment. Your rash is not gone in 4 weeks. The area around your rash gets red, warm, tender, and swollen. Summary Body ringworm is an infection of the skin that often causes a ring-shaped rash. This condition is easily spread from person to person (is very contagious). This condition may be treated with antifungal cream or ointment, antifungal shampoo, or antifungal medicines. Take over-the-counter and   prescription medicines only as told by your health care provider. This information is not intended to replace advice given to you by your health care provider. Make sure you discuss any questions you have with your health care provider. Document Revised: 01/11/2018 Document Reviewed: 01/11/2018 Elsevier Patient Education  [place].

## 2021-04-14 NOTE — Progress Notes (Signed)
Virtual Visit Consent   Debra Pruitt, you are scheduled for a virtual visit with a [facility] provider today.     Just as with appointments in the office, your consent must be obtained to participate.  Your consent will be active for this visit and any virtual visit you may have with one of our providers in the next 365 days.     If you have a MyChart account, a copy of this consent can be sent to you electronically.  All virtual visits are billed to your insurance company just like a traditional visit in the office.    As this is a virtual visit, video technology does not allow for your provider to perform a traditional examination.  This may limit your provider's ability to fully assess your condition.  If your provider identifies any concerns that need to be evaluated in person or the need to arrange testing (such as labs, EKG, etc.), we will make arrangements to do so.     Although advances in technology are sophisticated, we cannot ensure that it will always work on either your end or our end.  If the connection with a video visit is poor, the visit may have to be switched to a telephone visit.  With either a video or telephone visit, we are not always able to ensure that we have a secure connection.     I need to obtain your verbal consent now.   Are you willing to proceed with your visit today?    Debra Pruitt has provided verbal consent on 04/14/2021 for a virtual visit (video or telephone).   Debra Pruitt   Date: 04/14/2021 1:46 PM   Virtual Visit via Video Note   I, Debra Pruitt, connected with  Debra Pruitt  ([id number], 1990/12/04) on 04/14/21 at  1:45 PM EST by a video-enabled telemedicine application and verified that I am speaking with the correct person using two identifiers.  Location: Patient: Virtual Visit Location Patient: Home Provider: Virtual Visit Location Provider: Office/Clinic   I discussed the limitations of evaluation and management by  telemedicine and the availability of in person appointments. The patient expressed understanding and agreed to proceed.    History of Present Illness: Debra Pruitt is a 30 y.o. who identifies as a female who was assigned female at birth, and is being seen today for rash. Two raised circular areas on left upper arm. Noted to have started yesterday-Wednesday- itching a lot yesterday, not as much today, but areas are larger today. Denies changes in hygiene products. Denies known exposure to ring worm reports that she has two young children and a baby sister that is 2. No pets in the home or around in last week. Denies fevers chills or n/v.  No other concerns today.   Problems:  Patient Active Problem List   Diagnosis Date Noted   Food intolerance 03/10/2021   Chronic rhinitis 03/10/2021   Patellofemoral pain syndrome of both knees 01/11/2021   Cervical radiculopathy 05/31/2020   Capsulitis of toe of right foot 02/23/2020   Vitamin D deficiency 01/09/2020   Closed nondisplaced fracture of distal phalanx of great toe with routine healing, subsequent encounter 11/13/2019    Allergies:  Allergies  Allergen Reactions   Mangifera Indica Swelling   Mango Flavor Swelling   Pineapple Swelling   Other Hives, Itching and Rash    [place] Nutrition supplement   Medications:  Current Outpatient Medications:    albuterol (VENTOLIN HFA) 108 (90 Base)  MCG/ACT inhaler, Inhale 1-2 puffs into the lungs every 6 (six) hours as needed for wheezing or shortness of breath., Disp: 18 g, Rfl: 0   fluticasone (FLONASE) 50 MCG/ACT nasal spray, Place 2 sprays into both nostrils daily., Disp: 18.2 mL, Rfl: 6   fluticasone (FLOVENT HFA) 110 MCG/ACT inhaler, Inhale 1 puff into the lungs 2 (two) times daily. Rinse mouth after each use., Disp: 1 each, Rfl: 1   ibuprofen (ADVIL) 800 MG tablet, Take 1 tablet (800 mg total) by mouth 3 (three) times daily., Disp: 21 tablet, Rfl: 0   Rimegepant Sulfate (NURTEC) 75 MG TBDP,  Take 1 tablet as needed at onset of migraine. May take second dose after 24 hours. Do not take more than 3 a week, Disp: 10 tablet, Rfl: 5  Observations/Objective: Patient is well-developed, well-nourished in no acute distress.  Resting comfortably  at home.  Head is normocephalic, atraumatic.  No labored breathing.  Speech is clear and coherent with logical content.  Patient is alert and oriented at baseline.  Noted two circular red raised pattern rashes on posterior aspect of upper left arm  Assessment and Plan: 1. Ringworm of body Classic ring worm presentation Treatment provided OTC measures reviewed and on AVS   - ketoconazole (NIZORAL) 2 % cream; Apply 1 application topically 2 (two) times daily.  Dispense: 30 g; Refill: 0    Reviewed side effects, risks and benefits of medication.    Patient acknowledged agreement and understanding of the plan.   I discussed the assessment and treatment plan with the patient. The patient was provided an opportunity to ask questions and all were answered. The patient agreed with the plan and demonstrated an understanding of the instructions.   The patient was advised to call back or seek an in-person evaluation if the symptoms worsen or if the condition fails to improve as anticipated.   The above assessment and management plan was discussed with the patient. The patient verbalized understanding of and has agreed to the management plan. Patient is aware to call the clinic if symptoms persist or worsen. Patient is aware when to return to the clinic for a follow-up visit. Patient educated on when it is appropriate to go to the emergency department.   Follow Up Instructions: I discussed the assessment and treatment plan with the patient. The patient was provided an opportunity to ask questions and all were answered. The patient agreed with the plan and demonstrated an understanding of the instructions.  A copy of instructions were sent to the  patient via MyChart unless otherwise noted below.     The patient was advised to call back or seek an in-person evaluation if the symptoms worsen or if the condition fails to improve as anticipated.  Time:  I spent 10 minutes with the patient via telehealth technology discussing the above problems/concerns.    Debra Pruitt

## 2021-04-18 ENCOUNTER — Ambulatory Visit: Payer: 59 | Admitting: Podiatry

## 2021-04-28 ENCOUNTER — Ambulatory Visit: Payer: 59 | Admitting: Podiatry

## 2021-05-11 NOTE — Progress Notes (Signed)
FOLLOW UP Date of Service/Encounter:  05/12/21   Subjective:  Debra Pruitt (DOB: [birth year]) is a 30 y.o. female who returns to the [facility] on 05/12/2021 in re-evaluation of the following: acute visit for shortnes of breath History obtained from: chart review and patient and husband .  For Review, LV was on 03/10/2021 with Dr.Nyellie Yetter seen for nonallergic rhinitis and contact dermatitis with mangoes and pineapples (mouth feels "cut" and swollen, lips tingly-able to eat canned pineapple without symptoms).  Additionally developed hives after eating Ashwanda golgi vitamins  She was started on nasal steroid spray and Atrovent.  Previous diagnostics/pertinent history: Blood draw on 03/18/2021: Negative environmental panel, negative mango, blueberry, blackberry, raspberry History of hives after eating Ashwanda golgi vitamins  Since her last visit, she developed a new problem of shortness of breath following a pneumonia diagnosed in October She is using Flovent 110 2 puffs twice daily. she is also using albuterol as needed.  However, reports she is not using these medications consistently.  She doesn't feel relief from her symptoms when she uses the albuterol.  Reviewed her technique which was poor. She has a hard time getting her breath.  If she talks too much, has to stop to get her breath.  If she is doing too much, she will feel like she can't breath.  Feels like it is hard to push air out. She also has COVID in February 2021.  She had similar breathing issues happening with COVID, but these resolved after few months.  She has no previous diagnosis of asthma Steroid injections do help.  A few hours after, her chest felt like it was on fire. She couldn't take prendnisone because of how it makes her feel. She hasn't used the Flovent in a few days. Smells make it worse it sometimes.  Doesn't feel like her throat is tight.  No voice changes.  She doesn't hear wheezing.  Had a CXR  at an urgent care and was told it was normal.  She was told to follow-up with a "lung doctor."  She has had no additional episodes of hives since she stopped taking the supplemental vitamins.  Allergies as of 05/12/2021       Reactions   Mangifera Indica Swelling   Mango Flavor Swelling   Pineapple Swelling   Other Hives, Itching, Rash   Goli Nutrition supplement        Medication List        Accurate as of May 12, 2021  1:59 PM. If you have any questions, ask your nurse or doctor.          albuterol 108 (90 Base) MCG/ACT inhaler Commonly known as: VENTOLIN HFA Inhale 1-2 puffs into the lungs every 6 (six) hours as needed for wheezing or shortness of breath.   budesonide-formoterol 80-4.5 MCG/ACT inhaler Commonly known as: Symbicort Inhale 2 puffs into the lungs in the morning and at bedtime. Started by: Sigurd Sos, MD   fluticasone 110 MCG/ACT inhaler Commonly known as: Flovent HFA Inhale 1 puff into the lungs 2 (two) times daily. Rinse mouth after each use.   fluticasone 50 MCG/ACT nasal spray Commonly known as: Flonase Place 2 sprays into both nostrils daily.   ibuprofen 800 MG tablet Commonly known as: ADVIL Take 1 tablet (800 mg total) by mouth 3 (three) times daily.   ketoconazole 2 % cream Commonly known as: NIZORAL Apply 1 application topically 2 (two) times daily.   Nurtec 75 MG Tbdp Generic drug: Rimegepant  Sulfate Take 1 tablet as needed at onset of migraine. May take second dose after 24 hours. Do not take more than 3 a week   promethazine-dextromethorphan 6.25-15 MG/5ML syrup Commonly known as: PROMETHAZINE-DM Take 5 mLs by mouth every 4 (four) hours as needed.       Past Medical History:  Diagnosis Date   Anemia    Angio-edema    Anxiety    Bacterial vaginosis    Low back pain    Ovarian cyst    Peripheral neuropathy    Urticaria    Vitamin D deficiency    Past Surgical History:  Procedure Laterality Date   CESAREAN  SECTION  04/30/2013   MULTIPLE TOOTH EXTRACTIONS     Otherwise, there have been no changes to her past medical history, surgical history, family history, or social history.  ROS: All others negative except as noted per HPI.   Objective:  BP 112/80    Pulse 76    Temp 97.7 F (36.5 C) (Temporal)    Resp 19    Ht 5' 6.5" (1.689 m)    Wt 169 lb 6.4 oz (76.8 kg)    SpO2 100%    BMI 26.93 kg/m  Body mass index is 26.93 kg/m. Physical Exam: General Appearance:  Alert, cooperative, no distress, appears stated age  Head:  Normocephalic, without obvious abnormality, atraumatic  Eyes:  Conjunctiva clear, EOM's intact  Nose: Nares normal  Throat: Lips, tongue normal; teeth and gums normal  Neck: Supple, symmetrical  Lungs:   Clear to auscultation bilaterally, moving adequate air throughout, respirations unlabored, no coughing, takes occasional pauses in her breath then seems to force air out, lungs clear with adequate aeration when this occurs  Heart:  Regular rate and rhythm, no murmur, appears well perfused  Extremities: No edema  Skin: Skin color, texture, turgor normal, no rashes or lesions on visualized portions of skin  Neurologic: No gross deficits  Spirometry:  Tracings reviewed. Her effort: Good reproducible efforts. FVC: 3.21L FEV1: 2.66L, 90% predicted FEV1/FVC ratio: 99% Interpretation: Spirometry consistent with normal pattern.  Please see scanned spirometry results for details.  Assessment/Plan  Shortness of breath: Some of her symptoms are concerning for possible asthma.  Her spirometry is normal, but we discussed this would not necessarily rule out asthma.  Though based on her current symptoms, would expect some level of obstruction not seen on today's Spirometry.  Additionally she reports not having improvement after inhaled steroid or bronchodilator usage.  After reviewing inhaler technique with her, it appears she has not been doing this correctly.  Discussed trial of a new  asthma medication (ICS/LABA), and if no improvement would consider other work-up at that time including possible referral to pulmonary.  Some of her symptoms exhibited in clinic consistent with breath-holding-query possible upper airway spasm.  She has adequate oxygenation and normal breath sounds.  When pulmonary is mentioned, she expressed that she believed she was at a pulmonologist today.  She asked for referral.  Explained that we do treat asthma, but not other lung diseases.  Happy to place her referral, and provided trial of new asthma inhaler. Her other symptoms are improved.  She has not eaten any more golgi vitamins and therefore had no more episodes of hives.  - trial Symbicort 80, 2 puffs twice a day - will place referral to pulmonary  Discussed follow-up in a few months if she has not been seen by pulmonary at that time.  Sigurd Sos, MD  [facility]

## 2021-05-12 ENCOUNTER — Ambulatory Visit (INDEPENDENT_AMBULATORY_CARE_PROVIDER_SITE_OTHER): Payer: 59 | Admitting: Internal Medicine

## 2021-05-12 ENCOUNTER — Encounter: Payer: Self-pay | Admitting: Internal Medicine

## 2021-05-12 ENCOUNTER — Other Ambulatory Visit: Payer: Self-pay

## 2021-05-12 VITALS — BP 112/80 | HR 76 | Temp 97.7°F | Resp 19 | Ht 66.5 in | Wt 169.4 lb

## 2021-05-12 DIAGNOSIS — L508 Other urticaria: Secondary | ICD-10-CM | POA: Diagnosis not present

## 2021-05-12 DIAGNOSIS — R0602 Shortness of breath: Secondary | ICD-10-CM | POA: Diagnosis not present

## 2021-05-12 MED ORDER — BUDESONIDE-FORMOTEROL FUMARATE 80-4.5 MCG/ACT IN AERO: 2.0000 | INHALATION_SPRAY | Freq: Two times a day (BID) | RESPIRATORY_TRACT | 6 refills | Status: DC

## 2021-05-25 ENCOUNTER — Encounter: Payer: Self-pay | Admitting: Pulmonary Disease

## 2021-05-25 ENCOUNTER — Ambulatory Visit (INDEPENDENT_AMBULATORY_CARE_PROVIDER_SITE_OTHER): Payer: 59 | Admitting: Pulmonary Disease

## 2021-05-25 ENCOUNTER — Other Ambulatory Visit: Payer: Self-pay

## 2021-05-25 VITALS — BP 122/68 | HR 82 | Temp 98.7°F | Wt 167.8 lb

## 2021-05-25 DIAGNOSIS — J4530 Mild persistent asthma, uncomplicated: Secondary | ICD-10-CM | POA: Diagnosis not present

## 2021-05-25 MED ORDER — BUDESONIDE-FORMOTEROL FUMARATE 160-4.5 MCG/ACT IN AERO: 2.0000 | INHALATION_SPRAY | Freq: Two times a day (BID) | RESPIRATORY_TRACT | 12 refills | Status: DC

## 2021-05-25 NOTE — Patient Instructions (Addendum)
Nice to meet you  Use symbicort 2 puffs twice a day every day with a spacer - rinse mouth with water after every use  Use albuterol as need for the chest tightness or shortness of breath with the spacer   If not improving in a month please call us and we will plan to schedule pulmonary function tests if needed  Return to clinic in 3 months or sooner as needed with Dr. Silas Flood

## 2021-06-13 ENCOUNTER — Encounter: Payer: 59 | Admitting: Nurse Practitioner

## 2021-06-27 ENCOUNTER — Ambulatory Visit: Payer: Self-pay | Admitting: Neurology

## 2021-07-28 ENCOUNTER — Ambulatory Visit (INDEPENDENT_AMBULATORY_CARE_PROVIDER_SITE_OTHER): Payer: Self-pay | Admitting: Family Medicine

## 2021-07-28 ENCOUNTER — Other Ambulatory Visit: Payer: Self-pay

## 2021-07-28 ENCOUNTER — Encounter: Payer: Self-pay | Admitting: Family Medicine

## 2021-07-28 VITALS — BP 116/80 | HR 88 | Temp 97.5°F | Ht 66.0 in | Wt 170.2 lb

## 2021-07-28 DIAGNOSIS — G40009 Localization-related (focal) (partial) idiopathic epilepsy and epileptic syndromes with seizures of localized onset, not intractable, without status epilepticus: Secondary | ICD-10-CM

## 2021-07-28 DIAGNOSIS — J4530 Mild persistent asthma, uncomplicated: Secondary | ICD-10-CM

## 2021-07-28 DIAGNOSIS — D2272 Melanocytic nevi of left lower limb, including hip: Secondary | ICD-10-CM

## 2021-07-28 DIAGNOSIS — G43009 Migraine without aura, not intractable, without status migrainosus: Secondary | ICD-10-CM | POA: Insufficient documentation

## 2021-07-28 HISTORY — DX: Mild persistent asthma, uncomplicated: J45.30

## 2021-07-28 NOTE — Progress Notes (Signed)
?[facility] ?[address] ?[place] [address] ?Dept: [phone] ?Dept Fax: [phone] ? ?Transfer of Care Office Visit ? ?Subjective:  ? ? Patient ID: Samiksha Pellicano, female    DOB: [birth year], 31 y.o..   MRN: [id number] ? ?Chief Complaint  ?Patient presents with  ? Establish Care  ?  NP- Establish care. C.o having a black spot under the LT toe.    ? ? ?History of Present Illness: ? ?Patient is in today to establish care. Ms. Loberg was born in [place], [place]. She attended some college at [employer] and then [employer], majoring in business. 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 She has been married for 2 years. She has two children (4, 8) and a step-child (13). She denies tobacco or drug use and drinks alcohol socially. ? ?Ms. Aldape has recently had issues with dyspnea. She was been seen by Dr. Silas Flood (pulmonology) who has diagnosed asthma. She is currently on Symbicort and using her albuterol twice a day. ? ?Ms. Schexnayder has a history of migraine headaches. She is managed on rimegipant (Nurtec) by Dr. Delice Lesch. She is only having a migraine about every 2 months now. ? ?Ms. Branscom apparently also has been identified with nocturnal seizures. She was apparently responding well to Zonisamide, but is now off of the meds. She notes she is due for follow-up with Dr. Delice Lesch. ? ?Ms. Smith notes a darkly pigmented spot at the base of her left 5th toe. She feels like this is increasing in size with time.  ? ?Past Medical History: ?Patient Active Problem List  ? Diagnosis Date Noted  ? Localization-related idiopathic epilepsy and epileptic syndromes with seizures of localized onset, not intractable, without status epilepticus ([place]) 07/28/2021  ? Migraine headache without aura 07/28/2021  ? Mild persistent asthma 07/28/2021  ? Food intolerance  03/10/2021  ? Vitamin D deficiency 01/09/2020  ? ?Past Surgical History:  ?Procedure Laterality Date  ? CESAREAN SECTION  04/30/2013  ? x 2  ? MULTIPLE TOOTH EXTRACTIONS    ? SALPINGECTOMY Right   ? for ectopic pregnancy  ? ?Family History  ?Problem Relation Age of Onset  ? Hypertension Mother   ? Thyroid disease Mother   ? HIV Father   ? Cirrhosis Father   ? Cancer Maternal Grandmother   ?     Breast  ? Asthma Maternal Grandmother   ? Hypertension Maternal Grandfather   ? Heart disease Maternal Grandfather   ? Diabetes Maternal Grandfather   ? Hypertension Paternal Grandmother   ? COPD Paternal Grandmother   ? Heart disease Paternal Grandmother   ? Hyperlipidemia Paternal Grandmother   ? Stroke Paternal Grandmother   ? Diabetes Paternal Grandmother   ? Hepatitis C Paternal Grandmother   ? Rheum arthritis Paternal Grandmother   ? ?Outpatient Medications Prior to Visit  ?Medication Sig Dispense Refill  ? albuterol (VENTOLIN HFA) 108 (90 Base) MCG/ACT inhaler Inhale 1-2 puffs into the lungs every 6 (six) hours as needed for wheezing or shortness of breath. 18 g 0  ? budesonide-formoterol (SYMBICORT) 160-4.5 MCG/ACT inhaler Inhale 2 puffs into the lungs in the morning and at bedtime. 1 each 12  ? ibuprofen (ADVIL) 800 MG tablet Take 1 tablet (800 mg total) by mouth 3 (three) times daily. 21 tablet 0  ? psyllium (REGULOID) 0.52 g capsule Take 0.52 g by mouth daily.    ?  Rimegepant Sulfate (NURTEC) 75 MG TBDP Take 1 tablet as needed at onset of migraine. May take second dose after 24 hours. Do not take more than 3 a week 10 tablet 5  ? fluticasone (FLONASE) 50 MCG/ACT nasal spray Place 2 sprays into both nostrils daily. 18.2 mL 6  ? promethazine-dextromethorphan (PROMETHAZINE-DM) 6.25-15 MG/5ML syrup Take 5 mLs by mouth every 4 (four) hours as needed.    ? fluticasone (FLOVENT HFA) 110 MCG/ACT inhaler Inhale 1 puff into the lungs 2 (two) times daily. Rinse mouth after each use. 1 each 1  ? ketoconazole (NIZORAL) 2 %  cream Apply 1 application topically 2 (two) times daily. 30 g 0  ? ?No facility-administered medications prior to visit.  ? ?Allergies  ?Allergen Reactions  ? Mangifera Indica Swelling  ? Mango Flavor Swelling  ? Pineapple Swelling  ? Other Hives, Itching and Rash  ?  Goli Nutrition supplement  ?  ?Objective:  ? ?Today's Vitals  ? 07/28/21 0945  ?BP: 116/80  ?Pulse: 88  ?Temp: (!) 97.5 ?F (36.4 ?C)  ?TempSrc: Temporal  ?SpO2: 99%  ?Weight: 170 lb 3.2 oz (77.2 kg)  ?Height: 5\' 6"  (1.676 m)  ? ?Body mass index is 27.47 kg/m?.  ? ?General: Well developed, well nourished. No acute distress. ?Lungs: Clear to auscultation bilaterally. No wheezing, rales or rhonchi. ?Skin: Warm and dry. There is an irregular dark brown macule at the base of the left 5th toe on  ? the inferior surface. ?Psych: Alert and oriented. Normal mood and affect. ? ?Health Maintenance Due  ?Topic Date Due  ? HIV Screening  Never done  ? Hepatitis C Screening  Never done  ? TETANUS/TDAP  Never done  ? PAP SMEAR-Modifier  Never done  ?   ?Lab Results: ?Lab Results  ?Component Value Date  ? CHOL 166 01/08/2020  ? HDL 60.50 01/08/2020  ? [place] 95 01/08/2020  ? TRIG 48.0 01/08/2020  ? CHOLHDL 3 01/08/2020  ? ?Assessment & Plan:  ? ?1. Mild persistent asthma without complication ?Currently managed on Symbicort and PRN albuterol. Appears in good control today. ? ?2. Localization-related idiopathic epilepsy and epileptic syndromes with seizures of localized onset, not intractable, without status epilepticus ([place]) ?No longer on zonisamide. I recommend she follow-up with Dr. Delice Lesch. ? ?3. Migraine without aura and without status migrainosus, not intractable ?Stable on rimegepant. We will continue for rescue treatment for her migraines. ? ?4. Melanocytic nevus of left lower extremity ?Although the lesion is uniformly pigmented, it does have irregular borders and is increasing in size per patient history. I will refer her to dermatology for assessment. ? ?-  Ambulatory referral to Dermatology ? ?Return for Schedule for physical exam.  ? ?Haydee Salter, MD ?

## 2021-08-02 NOTE — Progress Notes (Signed)
@Patient  ID: Debra Pruitt, female    DOB: 14-Sep-1990, 31 y.o.   MRN: [id number]  Chief Complaint  Patient presents with   Consult    Consult for [facility]. Has had SHOB since the end of October. Was triggered by daughter having the flu and was treated for PNA. Does have chest pain and tightness at times. Flovent and albuterol inhaler doesn't help.     Referring provider: Tonny Bollman, MD  HPI:   31 y.o. woman whom we are seeing in consultation for evaluation of dyspnea on exertion.  Note from PCP reviewed.  Patient notes illness started around October, daughter had flu.  Patient felt ill shortly thereafter.  Treated for flu and pneumonia in the coming days.  Since then has had dyspnea on exertion.  Worse on inclines or stairs.  Present throughout the day, no position that makes things better or worse.  Recently started Symbicort about 2 weeks ago.  Maybe is helped a little bit, not much.  No other relieving or exacerbating factors.  She reports that chest x-ray at her PCP was reportedly clear.  I cannot see the results of this.  She notes history of seasonal allergies.  Notes that certain things recently such as perfumes, strong smells trigger her worsening shortness of breath.  PMH: Seasonal allergies Surgical history: C-section Family history: Mother with hypertension, hypothyroidism, father with HIV, cirrhosis Social history: Never smoker, lives in [facility]   Questionaires / Pulmonary Flowsheets:   ACT:  No flowsheet data found.  MMRC: No flowsheet data found.  [place]:  No flowsheet data found.  Tests:   FENO:  No results found for: NITRICOXIDE  PFT: No flowsheet data found.  WALK:  No flowsheet data found.  Imaging: No results found.  Lab Results: Personally reviewed CBC    Component Value Date/Time   WBC 7.0 01/08/2020 1435   RBC 4.18 01/08/2020 1435   HGB 13.2 01/08/2020 1435   HCT 39.1 01/08/2020 1435   PLT 189.0 01/08/2020 1435    MCV 93.6 01/08/2020 1435   MCH 31.8 06/30/2015 0053   MCHC 33.7 01/08/2020 1435   RDW 12.4 01/08/2020 1435    BMET    Component Value Date/Time   NA 141 01/08/2020 1435   K 4.5 01/08/2020 1435   CL 103 01/08/2020 1435   CO2 30 01/08/2020 1435   GLUCOSE 95 01/08/2020 1435   BUN 11 01/08/2020 1435   CREATININE 0.78 01/08/2020 1435   CALCIUM 9.8 01/08/2020 1435   GFRNONAA >60 06/30/2015 0053   GFRAA >60 06/30/2015 0053    BNP No results found for: BNP  ProBNP No results found for: PROBNP  Specialty Problems       Pulmonary Problems   Mild persistent asthma    Allergies  Allergen Reactions   Mangifera Indica Swelling   Mango Flavor Swelling   Pineapple Swelling   Other Hives, Itching and Rash    Goli Nutrition supplement    Immunization History  Administered Date(s) Administered   DTaP 08/28/1988, 10/31/1988, 02/12/1989, 11/20/1989, 10/21/1992   Hepatitis B 11/20/1989, 02/16/2000, 03/21/2000, 09/12/2000   HiB (PRP-OMP) 11/20/1989   IPV 08/28/1988, 10/31/1988, 11/20/1989, 10/21/1992   Influenza,inj,Quad PF,6+ Mos 04/09/2013   Influenza-Unspecified 04/09/2013   MMR 11/20/1989, 10/21/1992   PPD Test 10/27/2011, 12/03/2012, 06/26/2016    Past Medical History:  Diagnosis Date   Anemia    Angio-edema    Anxiety    Bacterial vaginosis    Low back pain  Mild persistent asthma 07/28/2021   Ovarian cyst    Peripheral neuropathy    Urticaria    Vitamin D deficiency     Tobacco History: Social History   Tobacco Use  Smoking Status Never  Smokeless Tobacco Never   Counseling given: Not Answered   Continue to not smoke  Outpatient Encounter Medications as of 05/25/2021  Medication Sig   albuterol (VENTOLIN HFA) 108 (90 Base) MCG/ACT inhaler Inhale 1-2 puffs into the lungs every 6 (six) hours as needed for wheezing or shortness of breath.   budesonide-formoterol (SYMBICORT) 160-4.5 MCG/ACT inhaler Inhale 2 puffs into the lungs in the morning and at  bedtime.   ibuprofen (ADVIL) 800 MG tablet Take 1 tablet (800 mg total) by mouth 3 (three) times daily.   Rimegepant Sulfate (NURTEC) 75 MG TBDP Take 1 tablet as needed at onset of migraine. May take second dose after 24 hours. Do not take more than 3 a week   [DISCONTINUED] fluticasone (FLOVENT HFA) 110 MCG/ACT inhaler Inhale 1 puff into the lungs 2 (two) times daily. Rinse mouth after each use.   [DISCONTINUED] ketoconazole (NIZORAL) 2 % cream Apply 1 application topically 2 (two) times daily.   fluticasone (FLONASE) 50 MCG/ACT nasal spray Place 2 sprays into both nostrils daily.   promethazine-dextromethorphan (PROMETHAZINE-DM) 6.25-15 MG/5ML syrup Take 5 mLs by mouth every 4 (four) hours as needed.   [DISCONTINUED] budesonide-formoterol (SYMBICORT) 80-4.5 MCG/ACT inhaler Inhale 2 puffs into the lungs in the morning and at bedtime. (Patient not taking: Reported on 05/25/2021)   No facility-administered encounter medications on file as of 05/25/2021.     Review of Systems  Review of Systems  No chest pain with exertion, no orthopnea or PND, no lower extremity swelling, comprehensive review of systems otherwise negative. Physical Exam  BP 122/68 (BP Location: Left Arm, Patient Position: Sitting, Cuff Size: Normal)   Pulse 82   Temp 98.7 F (37.1 C) (Oral)   Wt 167 lb 12.8 oz (76.1 kg)   SpO2 98%   BMI 26.68 kg/m   Wt Readings from Last 5 Encounters:  07/28/21 170 lb 3.2 oz (77.2 kg)  05/25/21 167 lb 12.8 oz (76.1 kg)  05/12/21 169 lb 6.4 oz (76.8 kg)  04/11/21 169 lb (76.7 kg)  03/10/21 165 lb 9.6 oz (75.1 kg)    BMI Readings from Last 5 Encounters:  07/28/21 27.47 kg/m  05/25/21 26.68 kg/m  05/12/21 26.93 kg/m  04/11/21 28.12 kg/m  03/10/21 26.73 kg/m     Physical Exam General: Well-appearing, no acute distress Eyes: EOMI, no icterus Neck: Supple, no JVP Pulmonary: Clear, normal work of breathing Cardiovascular: Regular rate and rhythm, no murmur Abdomen:  Nondistended, bowel sounds present MSK: No synovitis, no joint effusion Neuro: Normal gait, no weakness Psych: Normal mood, full affect   Assessment & Plan:   Dyspnea on exertion: High suspicion for poorly controlled asthma likely exacerbated by flu infection in the fall.  Please see below.  Consider PFTs in the future if not improving with interventions below.  Some triggers such as smells, etc. but atypical, vocal cord dysfunction considered.  Asthma: Based on atopic symptoms, clear triggers, worse in the setting of viral illness.  Recent prescribed Symbicort but not using currently.  Continue mid dose Symbicort 2 puff twice daily, with spacer.  Albuterol as needed.     Return in about 3 months (around 08/23/2021).   Karren Burly, MD 08/02/2021

## 2021-08-04 ENCOUNTER — Encounter: Payer: Self-pay | Admitting: Family Medicine

## 2021-08-17 ENCOUNTER — Telehealth: Payer: Self-pay

## 2021-08-17 NOTE — Telephone Encounter (Signed)
Nurtec PA started in cover my meds Contact plan to follow up on [id number] ?

## 2021-08-23 ENCOUNTER — Ambulatory Visit: Payer: 59 | Admitting: Pulmonary Disease

## 2021-08-24 ENCOUNTER — Encounter: Payer: Self-pay | Admitting: Family Medicine

## 2021-08-25 ENCOUNTER — Other Ambulatory Visit: Payer: Self-pay

## 2021-08-25 MED ORDER — NURTEC 75 MG PO TBDP: ORAL_TABLET | ORAL | 5 refills | Status: DC

## 2021-08-26 NOTE — Telephone Encounter (Signed)
Pt no longer has insurance Nurtec sent to [employer] to see if they will cover,  ?

## 2021-08-30 ENCOUNTER — Encounter: Payer: Self-pay | Admitting: Family Medicine

## 2021-08-30 ENCOUNTER — Ambulatory Visit (INDEPENDENT_AMBULATORY_CARE_PROVIDER_SITE_OTHER): Payer: 59 | Admitting: Family Medicine

## 2021-08-30 VITALS — BP 118/66 | HR 90 | Temp 97.8°F | Ht 65.25 in | Wt 172.2 lb

## 2021-08-30 DIAGNOSIS — Z114 Encounter for screening for human immunodeficiency virus [HIV]: Secondary | ICD-10-CM

## 2021-08-30 DIAGNOSIS — E559 Vitamin D deficiency, unspecified: Secondary | ICD-10-CM | POA: Diagnosis not present

## 2021-08-30 DIAGNOSIS — J4530 Mild persistent asthma, uncomplicated: Secondary | ICD-10-CM | POA: Diagnosis not present

## 2021-08-30 DIAGNOSIS — Z1159 Encounter for screening for other viral diseases: Secondary | ICD-10-CM

## 2021-08-30 DIAGNOSIS — D2272 Melanocytic nevi of left lower limb, including hip: Secondary | ICD-10-CM

## 2021-08-30 DIAGNOSIS — K5900 Constipation, unspecified: Secondary | ICD-10-CM | POA: Diagnosis not present

## 2021-08-30 DIAGNOSIS — Z0000 Encounter for general adult medical examination without abnormal findings: Secondary | ICD-10-CM

## 2021-08-30 DIAGNOSIS — Z23 Encounter for immunization: Secondary | ICD-10-CM

## 2021-08-30 LAB — VITAMIN D 25 HYDROXY (VIT D DEFICIENCY, FRACTURES): VITD: 15.78 ng/mL — ABNORMAL LOW (ref 30.00–100.00)

## 2021-08-30 NOTE — Patient Instructions (Signed)
Constipation: Our goal is to achieve formed bowel movements daily or every-other-day.  You may need to try different combinations of the following options to find what works best for you - everybody's body works differently so feel free to adjust the dosages as needed.  Some options to help maintain bowel health include: ?  ?Dietary changes (more leafy greens, vegetables and fruits; less processed foods) ?Fiber supplementation (Benefiber, FiberCon, Metamucil or Psyllium). Start slow and increase gradually to full dose. ?Over-the-counter agents such as: stool softeners (Docusate or Colace) and/or laxatives (Miralax, milk of magnesia)  ?"Power Pudding" is a natural mixture that may help your constipation.  To make blend 1 cup applesauce, 1 cup wheat bran, and 3/4 cup prune juice, refrigerate and then take 1 tablespoon daily with a large glass of water as needed. ? ?May use Miralax if no bowel movement in 3 days. ?

## 2021-08-30 NOTE — Progress Notes (Signed)
?[facility] ?[facility] ?[address] ?[place] [address] ?Dept: [phone] ?Dept Fax: [phone] ? ?Annual Physical Visit ? ?Subjective:  ? ? Patient ID: Debra Pruitt, female    DOB: Jul 17, 1990, 31 y.o..   MRN: [id number] ? ?Chief Complaint  ?Patient presents with  ? Annual Exam  ?  CPE/labs.  Fasting today. C/o having constipation/abdominal pain x 1 day.     ? ? ?History of Present Illness: ? ?Patient is in today for an annual physical/preventative visit. ? ?Review of Systems  ?Constitutional:  Negative for chills, diaphoresis, fever, malaise/fatigue and weight loss.  ?HENT:  Positive for congestion and tinnitus. Negative for ear discharge, ear pain, hearing loss, nosebleeds, sinus pain and sore throat.   ?     Rare episodes of tinnitus. Having typical allergy symptoms recently.  ?Eyes: Negative.  Negative for pain.  ?Respiratory:  Positive for cough and wheezing. Negative for shortness of breath.   ?     Cough and wheezing recently. Using her albuterol for management.  ?Cardiovascular:  Positive for chest pain. Negative for palpitations.  ?     Occasional episodes of feeling like she was punched int he chest. She associates this with gas pain. Other times, she has brief sharp, needle-like pain.  ?Gastrointestinal:  Positive for abdominal pain, constipation and heartburn. Negative for diarrhea, nausea and vomiting.  ?     History of constipation. Using daily fiber to try and manage. Today, having some pain and fullness feeling in the left lower quadrant.  ?Musculoskeletal: Negative.   ?Skin:  Negative for itching and rash.  ?     Hyperpigmented lesion at the base of the left 5th toe. Recently saw dermatology for this.  ?Neurological:  Positive for headaches.  ?     History of migraines managed on PRN rimegepant.  ?Endo/Heme/Allergies:  Positive for environmental allergies.  ? ?Past Medical History: ?Patient Active Problem List  ? Diagnosis Date Noted  ?  Melanocytic nevus of left lower extremity 08/30/2021  ? Localization-related idiopathic epilepsy and epileptic syndromes with seizures of localized onset, not intractable, without status epilepticus ([place]) 07/28/2021  ? Migraine headache without aura 07/28/2021  ? Mild persistent asthma 07/28/2021  ? Food intolerance 03/10/2021  ? Vitamin D deficiency 01/09/2020  ? ?Past Surgical History:  ?Procedure Laterality Date  ? CESAREAN SECTION  04/30/2013  ? x 2  ? MULTIPLE TOOTH EXTRACTIONS    ? SALPINGECTOMY Right   ? for ectopic pregnancy  ? ?Family History  ?Problem Relation Age of Onset  ? Hypertension Mother   ? Thyroid disease Mother   ? HIV Father   ? Cirrhosis Father   ? Cancer Maternal Grandmother   ?     Breast  ? Asthma Maternal Grandmother   ? Hypertension Maternal Grandfather   ? Heart disease Maternal Grandfather   ? Diabetes Maternal Grandfather   ? Hypertension Paternal Grandmother   ? COPD Paternal Grandmother   ? Heart disease Paternal Grandmother   ? Hyperlipidemia Paternal Grandmother   ? Stroke Paternal Grandmother   ? Diabetes Paternal Grandmother   ? Hepatitis C Paternal Grandmother   ? Rheum arthritis Paternal Grandmother   ? ?Outpatient Medications Prior to Visit  ?Medication Sig Dispense Refill  ? albuterol (VENTOLIN HFA) 108 (90 Base) MCG/ACT inhaler Inhale 1-2 puffs into the lungs every 6 (six) hours as needed for wheezing or shortness of breath. 18 g 0  ? budesonide-formoterol (SYMBICORT) 160-4.5 MCG/ACT inhaler Inhale 2 puffs  into the lungs in the morning and at bedtime. 1 each 12  ? ibuprofen (ADVIL) 800 MG tablet Take 1 tablet (800 mg total) by mouth 3 (three) times daily. 21 tablet 0  ? psyllium (REGULOID) 0.52 g capsule Take 0.52 g by mouth daily.    ? Rimegepant Sulfate (NURTEC) 75 MG TBDP Take 1 tablet as needed at onset of migraine. May take second dose after 24 hours. Do not take more than 3 a week 10 tablet 5  ? fluticasone (FLONASE) 50 MCG/ACT nasal spray Place 2 sprays into both  nostrils daily. 18.2 mL 6  ? promethazine-dextromethorphan (PROMETHAZINE-DM) 6.25-15 MG/5ML syrup Take 5 mLs by mouth every 4 (four) hours as needed.    ? ?No facility-administered medications prior to visit.  ? ?Allergies  ?Allergen Reactions  ? Mangifera Indica Swelling  ? Mango Flavor Swelling  ? Pineapple Swelling  ? Other Hives, Itching and Rash  ?  Goli Nutrition supplement  ?   ?Objective:  ? ?Today's Vitals  ? 08/30/21 0943  ?BP: 118/66  ?Pulse: 90  ?Temp: 97.8 ?F (36.6 ?C)  ?TempSrc: Temporal  ?SpO2: 100%  ?Weight: 172 lb 3.2 oz (78.1 kg)  ?Height: 5' 5.25" (1.657 m)  ? ?Body mass index is 28.44 kg/m?.  ? ?General: Well developed, well nourished. No acute distress. ?HEENT: Normocephalic, non-traumatic. PERRL, EOMI. Conjunctiva clear. Fundiscopic exam shows  ? normal disc and vasculature. External ears normal. EAC and TMs normal bilaterally. Nose   ? clear without congestion or rhinorrhea. Mucous membranes moist. Oropharynx clear. Good dentition. ?Neck: Supple. No lymphadenopathy. No thyromegaly. ?Lungs: Clear to auscultation bilaterally. No wheezing, rales or rhonchi. ?CV: RRR without murmurs or rubs. Pulses 2+ bilaterally. ?Abdomen: Soft. Bowel sounds positive, normal pitch and frequency. No hepatosplenomegaly. Mildly  ? tender in LLQ with some mild fullness. No rebound or guarding. ?Extremities: Full ROM. No joint swelling or tenderness. No edema noted. ?Skin: Warm and dry. There is an irregular dark brown macule at the base of the left 5th toe on  ? the inferior surface. ?Neuro: No focal neurological deficits. ?Psych: Alert and oriented. Normal mood and affect. ? ?Health Maintenance Due  ?Topic Date Due  ? Hepatitis C Screening  Never done  ?   ?Assessment & Plan:  ? ?1. Annual physical exam ?Overall good health. Discussed screenings and immunization needs. Ms. Bruss has a concern about family history of breast cancer. Youngest occurred in 17s. I recommend routine screening starting at age 37, every  other year until age 86, then annually. ? ?2. Vitamin D deficiency ?Past history of Vitamin D deficiency. We will reassess. ? ?- VITAMIN D 25 Hydroxy (Vit-D Deficiency, Fractures) ? ?3. Mild persistent asthma without complication ?Stable. Continue Symbicort and PRN albuterol. ? ?4. Constipation, unspecified constipation type ?Discussed constipation management. Provided information on use of laxatives and "Power Pudding" to help establish normal stool pattern. She may use Miralax when she has not had a bowel movement in 3 days. ? ?5. Melanocytic nevus of left lower extremity ?I recommend she follow-up with dermatology regarding their offer to remove this lesion. I explained that this would be sent for pathology to be sure there was no associated cancer. ? ?6. Encounter for hepatitis C screening test for low risk patient ? ?- HCV Ab w Reflex to Quant PCR ? ?7. Screening for HIV (human immunodeficiency virus) ? ?- HIV Antibody (routine testing w rflx) ? ? ?Return in about 6 months (around 03/01/2022) for Reassessment.  ? ?Lillette Boxer  Cybill Uriegas, MD ?

## 2021-08-31 ENCOUNTER — Encounter: Payer: 59 | Admitting: Family Medicine

## 2021-08-31 LAB — HIV ANTIBODY (ROUTINE TESTING W REFLEX): HIV 1&2 Ab, 4th Generation: NONREACTIVE

## 2021-08-31 MED ORDER — VITAMIN D (ERGOCALCIFEROL) 1.25 MG (50000 UNIT) PO CAPS: 50000.0000 [IU] | ORAL_CAPSULE | ORAL | 3 refills | Status: DC

## 2021-08-31 NOTE — Addendum Note (Signed)
Addended by: Haydee Salter on: 08/31/2021 08:04 AM ? ? Modules accepted: Orders ? ?

## 2021-09-01 LAB — HCV INTERPRETATION

## 2021-09-01 LAB — HCV AB W REFLEX TO QUANT PCR: HCV Ab: NONREACTIVE

## 2021-09-13 ENCOUNTER — Ambulatory Visit: Payer: 59 | Admitting: Pulmonary Disease

## 2021-09-13 ENCOUNTER — Encounter: Payer: Self-pay | Admitting: Pulmonary Disease

## 2021-09-13 VITALS — BP 120/64 | HR 80 | Temp 98.2°F | Ht 65.0 in | Wt 178.0 lb

## 2021-09-13 DIAGNOSIS — J4530 Mild persistent asthma, uncomplicated: Secondary | ICD-10-CM

## 2021-09-13 NOTE — Patient Instructions (Addendum)
Nice to see you again ? ?I am glad Symbicort has actually helped ? ?Try the samples I gave you, Breztri.  Use 2 puffs twice a day every day.  Just like Symbicort.  Rinse your mouth out after every use.  Do not use Symbicort while using the Breztri.  If you find this is beneficial, helps more than the Symbicort is currently, let me know and I will send a prescription for it.  We may need to do some negotiating with the insurance, to get it approved we can work on that if needed.  We have provided a co-pay card that should help with the cost if insurance does not cover it very well. ? ?Continue to use albuterol as needed.  Let me know if you need refills. ? ?Return to clinic in 3 months or sooner as needed with Dr. Silas Flood ?

## 2021-09-13 NOTE — Progress Notes (Signed)
? ?_0  ID: Debra Pruitt, female    DOB: 27-Oct-1990, 31 y.o.   MRN: [id number] ? ?Chief Complaint  ?Patient presents with  ? Follow-up  ?  Pt states that her astma is off and on right now. She states she is winded today due to that pollen. But she states overall she is ok. Symbicort and albuterol is what she takes   ? ? ?Referring provider: ?Haydee Salter, MD ? ?HPI:  ? ?31 y.o. woman whom we are seeing in follow up for evaluation of dyspnea on exertion felt to be related to asthma. ? ?At last visit was placed on Symbicort high-dose you should use 2 puffs twice a day.  She reported adherence to this.  Symptoms greatly improved.  Had minimal albuterol use.  Now using albuterol about once a day sometimes twice a day over the last week or 2.  She feels the recent worsening pollens are attributable to the increase in albuterol use and worsening symptoms. ? ?HPI at initial visit: ?Patient notes illness started around October, daughter had flu.  Patient felt ill shortly thereafter.  Treated for flu and pneumonia in the coming days.  Since then has had dyspnea on exertion.  Worse on inclines or stairs.  Present throughout the day, no position that makes things better or worse.  Recently started Symbicort about 2 weeks ago.  Maybe is helped a little bit, not much.  No other relieving or exacerbating factors.  She reports that chest x-ray at her PCP was reportedly clear.  I cannot see the results of this. ? ?She notes history of seasonal allergies.  Notes that certain things recently such as perfumes, strong smells trigger her worsening shortness of breath. ? ?PMH: Seasonal allergies ?Surgical history: C-section ?Family history: Mother with hypertension, hypothyroidism, father with HIV, cirrhosis ?Social history: Never smoker, lives in [place] ? ? ?Questionaires / Pulmonary Flowsheets:  ? ?ACT:  ?Asthma Control Test ACT Total Score  ?09/13/2021 ? 4:14 PM 17  ? ?MMRC: ?   ? View : No data to display.   ?  ?  ?  ? ? ?Epworth:  ?   ? View : No data to display.  ?  ?  ?  ? ? ?Tests:  ? ?FENO:  ?No results found for: NITRICOXIDE ? ?PFT: ?   ? View : No data to display.  ?  ?  ?  ? ? ?WALK:  ?   ? View : No data to display.  ?  ?  ?  ? ? ?Imaging: ?No results found. ? ?Lab Results: ?Personally reviewed ?CBC ?   ?Component Value Date/Time  ? WBC 7.0 01/08/2020 1435  ? RBC 4.18 01/08/2020 1435  ? HGB 13.2 01/08/2020 1435  ? HCT 39.1 01/08/2020 1435  ? PLT 189.0 01/08/2020 1435  ? MCV 93.6 01/08/2020 1435  ? MCH 31.8 06/30/2015 0053  ? MCHC 33.7 01/08/2020 1435  ? RDW 12.4 01/08/2020 1435  ? ? ?BMET ?   ?Component Value Date/Time  ? NA 141 01/08/2020 1435  ? K 4.5 01/08/2020 1435  ? CL 103 01/08/2020 1435  ? CO2 30 01/08/2020 1435  ? GLUCOSE 95 01/08/2020 1435  ? BUN 11 01/08/2020 1435  ? CREATININE 0.78 01/08/2020 1435  ? CALCIUM 9.8 01/08/2020 1435  ? GFRNONAA >60 06/30/2015 0053  ? GFRAA >60 06/30/2015 0053  ? ? ?BNP ?No results found for: BNP ? ?ProBNP ?No results found for: PROBNP ? ?Specialty Problems   ? ?  ?  Pulmonary Problems  ? Mild persistent asthma  ? ? ?Allergies  ?Allergen Reactions  ? Mangifera Indica Swelling  ? Mango Flavor Swelling  ? Pineapple Swelling  ? Other Hives, Itching and Rash  ?  Goli Nutrition supplement  ? ? ?Immunization History  ?Administered Date(s) Administered  ? DTaP 08/28/1988, 10/31/1988, 02/12/1989, 11/20/1989, 10/21/1992  ? Hepatitis B 11/20/1989, 02/16/2000, 03/21/2000, 09/12/2000  ? HiB (PRP-OMP) 11/20/1989  ? IPV 08/28/1988, 10/31/1988, 11/20/1989, 10/21/1992  ? Influenza,inj,Quad PF,6+ Mos 04/09/2013  ? Influenza-Unspecified 04/09/2013  ? MMR 11/20/1989, 10/21/1992  ? PPD Test 10/27/2011, 12/03/2012, 06/26/2016  ? Tdap 08/30/2021  ? ? ?Past Medical History:  ?Diagnosis Date  ? Anemia   ? Angio-edema   ? Anxiety   ? Bacterial vaginosis   ? Low back pain   ? Mild persistent asthma 07/28/2021  ? Ovarian cyst   ? Peripheral neuropathy   ? Urticaria   ? Vitamin D deficiency    ? ? ?Tobacco History: ?Social History  ? ?Tobacco Use  ?Smoking Status Never  ?Smokeless Tobacco Never  ? ?Counseling given: Not Answered ? ? ?Continue to not smoke ? ?Outpatient Encounter Medications as of 09/13/2021  ?Medication Sig  ? albuterol (VENTOLIN HFA) 108 (90 Base) MCG/ACT inhaler Inhale 1-2 puffs into the lungs every 6 (six) hours as needed for wheezing or shortness of breath.  ? budesonide-formoterol (SYMBICORT) 160-4.5 MCG/ACT inhaler Inhale 2 puffs into the lungs in the morning and at bedtime.  ? ibuprofen (ADVIL) 800 MG tablet Take 1 tablet (800 mg total) by mouth 3 (three) times daily.  ? psyllium (REGULOID) 0.52 g capsule Take 0.52 g by mouth daily.  ? Rimegepant Sulfate (NURTEC) 75 MG TBDP Take 1 tablet as needed at onset of migraine. May take second dose after 24 hours. Do not take more than 3 a week  ? Vitamin D, Ergocalciferol, (DRISDOL) 1.25 MG (50000 UNIT) CAPS capsule Take 1 capsule (50,000 Units total) by mouth every 7 (seven) days.  ? ?No facility-administered encounter medications on file as of 09/13/2021.  ? ? ? ?Review of Systems ? ?Review of Systems  ?N/a ?Physical Exam ? ?BP 120/64 (BP Location: Left Arm, Patient Position: Sitting, Cuff Size: Normal)   Pulse 80   Temp 98.2 ?F (36.8 ?C) (Oral)   Ht $R'5\' 5"'kc$  (1.651 m)   Wt 178 lb (80.7 kg)   SpO2 100%   BMI 29.62 kg/m?  ? ?Wt Readings from Last 5 Encounters:  ?09/13/21 178 lb (80.7 kg)  ?08/30/21 172 lb 3.2 oz (78.1 kg)  ?07/28/21 170 lb 3.2 oz (77.2 kg)  ?05/25/21 167 lb 12.8 oz (76.1 kg)  ?05/12/21 169 lb 6.4 oz (76.8 kg)  ? ? ?BMI Readings from Last 5 Encounters:  ?09/13/21 29.62 kg/m?  ?08/30/21 28.44 kg/m?  ?07/28/21 27.47 kg/m?  ?05/25/21 26.68 kg/m?  ?05/12/21 26.93 kg/m?  ? ? ? ?Physical Exam ?General: Well-appearing, no acute distress ?Eyes: EOMI, no icterus ?Neck: Supple, no JVP ?Pulmonary: Clear, normal work of breathing ?Cardiovascular: Regular rate and rhythm, no murmur ?Abdomen: Nondistended, bowel sounds present ?MSK:  No synovitis, no joint effusion ?Neuro: Normal gait, no weakness ?Psych: Normal mood, full affect ? ? ?Assessment & Plan:  ? ?Dyspnea on exertion: High suspicion for poorly controlled asthma likely exacerbated by flu infection in the fall 2022.  Improving ICS-LABA therapy.  A bit worse with pollens.  Please see below. ? ?Asthma: Based on atopic symptoms, clear triggers, worse in the setting of viral illness.  Improved with high-dose  Symbicort.  Overall symptoms are relatively well controlled although a bit worse over the last few weeks with pollen.  Escalate to [employer].  Continue albuterol as needed.   ? ? ?Return in about 3 months (around 12/13/2021). ? ? ?Lanier Clam, MD ?09/13/2021 ? ? ? ?

## 2021-09-30 ENCOUNTER — Encounter: Payer: Self-pay | Admitting: Pulmonary Disease

## 2021-10-04 MED ORDER — BREZTRI AEROSPHERE 160-9-4.8 MCG/ACT IN AERO: 2.0000 | INHALATION_SPRAY | Freq: Two times a day (BID) | RESPIRATORY_TRACT | 2 refills | Status: DC

## 2021-10-21 ENCOUNTER — Encounter: Payer: Self-pay | Admitting: Pulmonary Disease

## 2021-10-26 ENCOUNTER — Ambulatory Visit: Payer: 59 | Admitting: Pulmonary Disease

## 2021-10-26 ENCOUNTER — Encounter: Payer: Self-pay | Admitting: Pulmonary Disease

## 2021-10-26 VITALS — BP 124/66 | HR 89 | Temp 98.4°F | Ht 65.0 in | Wt 174.4 lb

## 2021-10-26 DIAGNOSIS — J4530 Mild persistent asthma, uncomplicated: Secondary | ICD-10-CM | POA: Diagnosis not present

## 2021-10-26 DIAGNOSIS — R0609 Other forms of dyspnea: Secondary | ICD-10-CM | POA: Diagnosis not present

## 2021-10-26 NOTE — Progress Notes (Signed)
_0  ID: Debra Pruitt, female    DOB: 10/27/1990, 31 y.o.   MRN: [id number]  Chief Complaint  Patient presents with   Acute Visit    Pt is here due to the fact that Debra Pruitt is causing her to have a dry tight cough. Went and got tested for flu, covid and strep all came back negative. [facility] bottom of lungs sounded bad. Been on Breztri for over a month now.     Referring provider: Haydee Salter, MD  HPI:   31 y.o. woman whom we are seeing in follow up for evaluation of dyspnea on exertion felt to be related to asthma.  At last visit, symptoms markedly improved on high-dose Symbicort.  With the pollen/allergy season, will start to have some breakthrough symptoms.  Increase in albuterol use.  Symbicort was escalated to [facility].  Since then, she does feel like it is helping more.  She notices wearing off in the evening when she takes her evening dose things feel better.  Overall symptoms seem better on Breztri compared to Symbicort.  Last week she had sore throat.  Was coughing.  Went to urgent care.  Tested negative for flu, COVID, strep throat.  Diagnosed with ear infection.  Placed on amoxicillin.  Coughed up thick phlegm.  Not associate with blood.  This was concerning to her.  Gradually symptoms have improved.  Still feels fatigued, sluggish.  Discussed at length likely viral infection that made her feel worse and cannot produce worsening cough, sputum production etc.  HPI at initial visit: Patient notes illness started around October, daughter had flu.  Patient felt ill shortly thereafter.  Treated for flu and pneumonia in the coming days.  Since then has had dyspnea on exertion.  Worse on inclines or stairs.  Present throughout the day, no position that makes things better or worse.  Recently started Symbicort about 2 weeks ago.  Maybe is helped a little bit, not much.  No other relieving or exacerbating factors.  She reports that chest x-ray at her PCP was  reportedly clear.  I cannot see the results of this.  She notes history of seasonal allergies.  Notes that certain things recently such as perfumes, strong smells trigger her worsening shortness of breath.  PMH: Seasonal allergies Surgical history: C-section Family history: Mother with hypertension, hypothyroidism, father with HIV, cirrhosis Social history: Never smoker, lives in [place] / Pulmonary Flowsheets:   ACT:  Asthma Control Test ACT Total Score  09/13/2021  4:14 PM 17   MMRC:     View : No data to display.          Epworth:      View : No data to display.          Tests:   FENO:  No results found for: NITRICOXIDE  PFT:     View : No data to display.        Spirometry 05/12/2021 personally reviewed and interpreted as normal  WALK:      View : No data to display.          Imaging: Personally reviewed and as per EMR No results found.  Lab Results: Personally reviewed CBC    Component Value Date/Time   WBC 7.0 01/08/2020 1435   RBC 4.18 01/08/2020 1435   HGB 13.2 01/08/2020 1435   HCT 39.1 01/08/2020 1435   PLT 189.0 01/08/2020 1435   MCV 93.6 01/08/2020  1435   MCH 31.8 06/30/2015 0053   MCHC 33.7 01/08/2020 1435   RDW 12.4 01/08/2020 1435    BMET    Component Value Date/Time   NA 141 01/08/2020 1435   K 4.5 01/08/2020 1435   CL 103 01/08/2020 1435   CO2 30 01/08/2020 1435   GLUCOSE 95 01/08/2020 1435   BUN 11 01/08/2020 1435   CREATININE 0.78 01/08/2020 1435   CALCIUM 9.8 01/08/2020 1435   GFRNONAA >60 06/30/2015 0053   GFRAA >60 06/30/2015 0053    BNP No results found for: BNP  ProBNP No results found for: PROBNP  Specialty Problems       Pulmonary Problems   Mild persistent asthma    Allergies  Allergen Reactions   Mangifera Indica Swelling   Mango Flavor Swelling   Pineapple Swelling   Other Hives, Itching and Rash    Goli Nutrition supplement    Immunization  History  Administered Date(s) Administered   DTaP 08/28/1988, 10/31/1988, 02/12/1989, 11/20/1989, 10/21/1992   Hepatitis B 11/20/1989, 02/16/2000, 03/21/2000, 09/12/2000   HiB (PRP-OMP) 11/20/1989   IPV 08/28/1988, 10/31/1988, 11/20/1989, 10/21/1992   Influenza,inj,Quad PF,6+ Mos 04/09/2013   Influenza-Unspecified 04/09/2013   MMR 11/20/1989, 10/21/1992   PPD Test 10/27/2011, 12/03/2012, 06/26/2016   Tdap 08/30/2021    Past Medical History:  Diagnosis Date   Anemia    Angio-edema    Anxiety    Bacterial vaginosis    Low back pain    Mild persistent asthma 07/28/2021   Ovarian cyst    Peripheral neuropathy    Urticaria    Vitamin D deficiency     Tobacco History: Social History   Tobacco Use  Smoking Status Never  Smokeless Tobacco Never   Counseling given: Not Answered   Continue to not smoke  Outpatient Encounter Medications as of 10/26/2021  Medication Sig   albuterol (VENTOLIN HFA) 108 (90 Base) MCG/ACT inhaler Inhale 1-2 puffs into the lungs every 6 (six) hours as needed for wheezing or shortness of breath.   Budeson-Glycopyrrol-Formoterol (BREZTRI AEROSPHERE) 160-9-4.8 MCG/ACT AERO Inhale 2 puffs into the lungs in the morning and at bedtime.   budesonide-formoterol (SYMBICORT) 160-4.5 MCG/ACT inhaler Inhale 2 puffs into the lungs in the morning and at bedtime.   ibuprofen (ADVIL) 800 MG tablet Take 1 tablet (800 mg total) by mouth 3 (three) times daily.   psyllium (REGULOID) 0.52 g capsule Take 0.52 g by mouth daily.   Rimegepant Sulfate (NURTEC) 75 MG TBDP Take 1 tablet as needed at onset of migraine. May take second dose after 24 hours. Do not take more than 3 a week   Vitamin D, Ergocalciferol, (DRISDOL) 1.25 MG (50000 UNIT) CAPS capsule Take 1 capsule (50,000 Units total) by mouth every 7 (seven) days.   No facility-administered encounter medications on file as of 10/26/2021.     Review of Systems  Review of Systems  N/a Physical Exam  BP 124/66 (BP  Location: Left Arm, Patient Position: Sitting, Cuff Size: Normal)   Pulse 89   Temp 98.4 F (36.9 C) (Oral)   Ht $R'5\' 5"'qT$  (1.651 m)   Wt 174 lb 6.4 oz (79.1 kg)   SpO2 99%   BMI 29.02 kg/m   Wt Readings from Last 5 Encounters:  10/26/21 174 lb 6.4 oz (79.1 kg)  09/13/21 178 lb (80.7 kg)  08/30/21 172 lb 3.2 oz (78.1 kg)  07/28/21 170 lb 3.2 oz (77.2 kg)  05/25/21 167 lb 12.8 oz (76.1 kg)    BMI  Readings from Last 5 Encounters:  10/26/21 29.02 kg/m  09/13/21 29.62 kg/m  08/30/21 28.44 kg/m  07/28/21 27.47 kg/m  05/25/21 26.68 kg/m     Physical Exam General: Well-appearing, no acute distress Eyes: EOMI, no icterus Neck: Supple, no JVP Pulmonary: Clear, normal work of breathing Cardiovascular: Regular rate and rhythm, no murmur Abdomen: Nondistended, bowel sounds present MSK: No synovitis, no joint effusion Neuro: Normal gait, no weakness Psych: Normal mood, full affect   Assessment & Plan:   Dyspnea on exertion: High suspicion for poorly controlled asthma likely exacerbated by flu infection in the fall 2022.  Significant improvement with ICS-LABA therapy.  A bit worse with pollens spring 2023.  Please see below.  Asthma: Based on atopic symptoms, clear triggers, worse in the setting of viral illness.  Improved with high-dose Symbicort.  Escalated to [facility] spring 2023 given worsening symptoms with pollen.  Symptoms have improved furthermore on Breztri.  To continue.  Continue albuterol as needed.   Return in about 3 months (around 01/26/2022).   Lanier Clam, MD 10/26/2021

## 2021-10-26 NOTE — Patient Instructions (Signed)
Nice to see you again  I am glad you are recovering from your recent illness  Your lungs sound quite clear today which is very reassuring  Continue Breztri, no changes to medications today.  Return to clinic in 3 months or sooner if needed with Dr. Silas Flood

## 2021-12-06 ENCOUNTER — Encounter: Payer: Self-pay | Admitting: Family Medicine

## 2021-12-06 ENCOUNTER — Ambulatory Visit (INDEPENDENT_AMBULATORY_CARE_PROVIDER_SITE_OTHER): Payer: 59 | Admitting: Family Medicine

## 2021-12-06 VITALS — BP 102/74 | Ht 65.0 in | Wt 174.0 lb

## 2021-12-06 DIAGNOSIS — M5412 Radiculopathy, cervical region: Secondary | ICD-10-CM

## 2021-12-06 MED ORDER — HYDROCODONE-ACETAMINOPHEN 5-325 MG PO TABS: 1.0000 | ORAL_TABLET | Freq: Three times a day (TID) | ORAL | 0 refills | Status: DC | PRN

## 2021-12-06 MED ORDER — PREDNISONE 5 MG PO TABS: ORAL_TABLET | ORAL | 0 refills | Status: DC

## 2021-12-06 NOTE — Assessment & Plan Note (Signed)
Acutely occurring.  Symptoms most consistent with a radicular type pain with impingement of the nerve. -Counseled on home exercise therapy and supportive care. -Prednisone. -Norco. -Could consider further imaging or physical therapy.

## 2021-12-06 NOTE — Patient Instructions (Signed)
Good to see you Please try heat  Please try the exercises  Please use the pain medicine as needed  Please send me a message in MyChart with any questions or updates.  Please see me back in 2-3 weeks.   --Dr. Raeford Razor

## 2021-12-06 NOTE — Progress Notes (Signed)
  Debra Pruitt - 31 y.o. female MRN [id number]  Date of birth: 14-Jun-1990  SUBJECTIVE:  Including CC & ROS.  No chief complaint on file.   Debra Pruitt is a 31 y.o. female that is presenting with right-sided neck chest and back and arm pain.  Has been ongoing for about a week.  No inciting event or trauma.  No history of similar pain.  Having pain that extends down to her hand.   Review of Systems See HPI   HISTORY: Past Medical, Surgical, Social, and Family History Reviewed & Updated per EMR.   Pertinent Historical Findings include:  Past Medical History:  Diagnosis Date   Anemia    Angio-edema    Anxiety    Bacterial vaginosis    Low back pain    Mild persistent asthma 07/28/2021   Ovarian cyst    Peripheral neuropathy    Urticaria    Vitamin D deficiency     Past Surgical History:  Procedure Laterality Date   CESAREAN SECTION  04/30/2013   x 2   MULTIPLE TOOTH EXTRACTIONS     SALPINGECTOMY Right    for ectopic pregnancy     PHYSICAL EXAM:  VS: BP 102/74 (BP Location: Left Arm, Patient Position: Sitting)   Ht '5\' 5"'$  (1.651 m)   Wt 174 lb (78.9 kg)   BMI 28.96 kg/m  Physical Exam Gen: NAD, alert, cooperative with exam, well-appearing MSK:  Neurovascularly intact       ASSESSMENT & PLAN:   Cervical radiculopathy Acutely occurring.  Symptoms most consistent with a radicular type pain with impingement of the nerve. -Counseled on home exercise therapy and supportive care. -Prednisone. -Norco. -Could consider further imaging or physical therapy.

## 2021-12-08 ENCOUNTER — Ambulatory Visit: Payer: 59 | Admitting: Pulmonary Disease

## 2021-12-14 NOTE — Progress Notes (Signed)
ANNUAL EXAM Patient name: Debra Pruitt MRN [id number]  Date of birth: [birth year] Chief Complaint:   Gynecologic Exam  History of Present Illness:   Debra Pruitt is a 31 y.o. G1P0 female being seen today for a routine annual exam.   Current complaints: She has pains in her right and left sometimes when she is due for her period when she rolls over. She also has pains into her legs on her period. She used recently a medication through a program called [facility] to be prescribed Norethindrone (she took it multiple times a day to suppress her period before a vacation) - I suspect the dose was the Aygestin dose '5mg'$ .   Reviewed operative note from 2017 when she had l/s salpingectomy due to ruptured ectopic: "Implants suspicious for endometriosis were noted along the cul-de-sac and pelvic sidewall"  Patient's last menstrual period was 11/22/2021 (exact date).  Current birth control: tubal ligation at time of last c-section  Last pap: 2018. Results were: NILM w/ HRHPV not done. H/O abnormal pap: yes  Health Maintenance Due  Topic Date Due   COVID-19 Vaccine (1) Never done    Review of Systems:   Pertinent items are noted in HPI Denies any headaches, blurred vision, fatigue, shortness of breath, chest pain, abdominal pain, abnormal vaginal discharge/itching/odor/irritation, bowel movements, urination, or intercourse unless otherwise stated above.  Pertinent History Reviewed:  Reviewed past medical,surgical, social and family history.  Reviewed problem list, medications and allergies. Physical Assessment:   Vitals:   12/19/21 1029  BP: 107/74  Pulse: 79  Weight: 172 lb (78 kg)  Height: '5\' 5"'$  (1.651 m)  Body mass index is 28.62 kg/m.   Physical Examination:  General appearance - well appearing, and in no distress Mental status - alert, oriented to person, place, and time Psych:  She has a normal mood and affect Skin - warm and dry, normal color, no suspicious lesions  noted Breasts - breasts appear normal, no suspicious masses, no skin or nipple changes or axillary nodes Abdomen - soft, nontender, nondistended, no masses or organomegaly Pelvic -  VULVA: normal appearing vulva with no masses, tenderness or lesions  VAGINA: normal appearing vagina with normal color and discharge, no lesions  CERVIX: normal appearing cervix without discharge or lesions, no CMT UTERUS: uterus is felt to be normal size, shape, consistency and nontender  ADNEXA: No adnexal masses or tenderness noted. Extremities:  No swelling or varicosities noted  Chaperone present for exam  No results found for this or any previous visit (from the past 24 hour(s)).  Assessment & Plan:  Debra Pruitt was seen today for gynecologic exam.  Diagnoses and all orders for this visit:  Encounter for annual routine gynecological examination -     Cytology - PAP( [facility]) - Cervical cancer screening: Discussed guidelines. Pap with HPV done - Gardasil: has not yet had. Will provide information. Discussed limits of insurance coverage but will get her more information on them in the interim.  - GC/CT: accepts - Birth Control: tubal - Breast Health: Encouraged self breast awareness/SBE. Teaching provided.  - F/U 12 months and prn  Endometriosis - Discussed options for management of endometriosis - I.e. birth control based products and the Chauncey antagonists and no intervention or Ibuprofen/naproxen.  - Reviewed mechanism of action for [facility] medications. She would like to try these. If does well on them, discussed doing them for a couple years in hopes of remission for endometriosis and hopefully she would not form new endometriosis since  she has had tubal ligation/salpingectomy.  -     Relugolix-Estradiol-Norethind (MYFEMBREE) 40-1-0.5 MG TABS; Take 1 tablet by mouth daily.  Meds:  Meds ordered this encounter  Medications   Relugolix-Estradiol-Norethind (MYFEMBREE) 40-1-0.5 MG TABS    Sig: Take 1  tablet by mouth daily.    Dispense:  28 tablet    Refill:  12    Follow-up: Return in about 1 year (around 12/20/2022) for annual.  Radene Gunning, MD 12/19/2021 11:13 AM

## 2021-12-19 ENCOUNTER — Ambulatory Visit (INDEPENDENT_AMBULATORY_CARE_PROVIDER_SITE_OTHER): Payer: 59 | Admitting: Obstetrics and Gynecology

## 2021-12-19 ENCOUNTER — Other Ambulatory Visit (HOSPITAL_COMMUNITY)
Admission: RE | Admit: 2021-12-19 | Discharge: 2021-12-19 | Disposition: A | Discharge status: Home / Self Care | Payer: 59 | Source: Ambulatory Visit | Attending: Obstetrics and Gynecology | Admitting: Obstetrics and Gynecology

## 2021-12-19 ENCOUNTER — Encounter: Payer: Self-pay | Admitting: Obstetrics and Gynecology

## 2021-12-19 VITALS — BP 107/74 | HR 79 | Ht 65.0 in | Wt 172.0 lb

## 2021-12-19 DIAGNOSIS — N809 Endometriosis, unspecified: Secondary | ICD-10-CM | POA: Diagnosis not present

## 2021-12-19 DIAGNOSIS — Z01419 Encounter for gynecological examination (general) (routine) without abnormal findings: Secondary | ICD-10-CM | POA: Insufficient documentation

## 2021-12-19 MED ORDER — MYFEMBREE 40-1-0.5 MG PO TABS: 1.0000 | ORAL_TABLET | Freq: Every day | ORAL | 12 refills | Status: DC

## 2021-12-19 NOTE — Addendum Note (Signed)
Addended by: Wendelyn Breslow L on: 12/19/2021 11:21 AM   Modules accepted: Orders

## 2021-12-22 LAB — CYTOLOGY - PAP
Chlamydia: NEGATIVE
Comment: NEGATIVE
Comment: NEGATIVE
Comment: NEGATIVE
Comment: NORMAL
Diagnosis: NEGATIVE
High risk HPV: NEGATIVE
Neisseria Gonorrhea: NEGATIVE
Trichomonas: NEGATIVE

## 2021-12-29 ENCOUNTER — Ambulatory Visit: Payer: 59 | Admitting: Family Medicine

## 2022-01-19 ENCOUNTER — Ambulatory Visit: Payer: 59 | Admitting: Pulmonary Disease

## 2022-01-24 ENCOUNTER — Other Ambulatory Visit: Payer: Self-pay | Admitting: *Deleted

## 2022-01-24 MED ORDER — BREZTRI AEROSPHERE 160-9-4.8 MCG/ACT IN AERO: 2.0000 | INHALATION_SPRAY | Freq: Two times a day (BID) | RESPIRATORY_TRACT | 5 refills | Status: DC

## 2022-01-31 ENCOUNTER — Other Ambulatory Visit: Payer: Self-pay

## 2022-01-31 MED ORDER — BREZTRI AEROSPHERE 160-9-4.8 MCG/ACT IN AERO: 2.0000 | INHALATION_SPRAY | Freq: Two times a day (BID) | RESPIRATORY_TRACT | 6 refills | Status: DC

## 2022-02-10 ENCOUNTER — Ambulatory Visit: Payer: 59 | Admitting: Pulmonary Disease

## 2022-02-11 ENCOUNTER — Encounter: Payer: Self-pay | Admitting: Family Medicine

## 2022-02-15 ENCOUNTER — Encounter: Payer: Self-pay | Admitting: Family Medicine

## 2022-02-15 ENCOUNTER — Ambulatory Visit (INDEPENDENT_AMBULATORY_CARE_PROVIDER_SITE_OTHER): Payer: Commercial Managed Care - HMO | Admitting: Family Medicine

## 2022-02-15 VITALS — BP 92/60 | Ht 65.0 in | Wt 172.0 lb

## 2022-02-15 DIAGNOSIS — G5711 Meralgia paresthetica, right lower limb: Secondary | ICD-10-CM | POA: Diagnosis not present

## 2022-02-15 MED ORDER — PREDNISONE 5 MG PO TABS: ORAL_TABLET | ORAL | 0 refills | Status: DC

## 2022-02-15 NOTE — Progress Notes (Signed)
  Debra Pruitt - 31 y.o. female MRN [id number]  Date of birth: 08-10-1990  SUBJECTIVE:  Including CC & ROS.  No chief complaint on file.   Debra Pruitt is a 31 y.o. female that is presenting with right lateral thigh ultra sensation.  The sensation has been occurring for a few weeks.  It is worse at night.  Denies a history of similar pain.  Review of the lumbar spine MRI from 2016 shows no acute changes.  Review of Systems See HPI   HISTORY: Past Medical, Surgical, Social, and Family History Reviewed & Updated per EMR.   Pertinent Historical Findings include:  Past Medical History:  Diagnosis Date   Anemia    Angio-edema    Anxiety    Bacterial vaginosis    Low back pain    Mild persistent asthma 07/28/2021   Ovarian cyst    Peripheral neuropathy    Urticaria    Vitamin D deficiency     Past Surgical History:  Procedure Laterality Date   CESAREAN SECTION  04/30/2013   x 2   MULTIPLE TOOTH EXTRACTIONS     SALPINGECTOMY Right    for ectopic pregnancy     PHYSICAL EXAM:  VS: BP 92/60 (BP Location: Left Arm, Patient Position: Sitting)   Ht '5\' 5"'$  (1.651 m)   Wt 172 lb (78 kg)   BMI 28.62 kg/m  Physical Exam Gen: NAD, alert, cooperative with exam, well-appearing MSK:  Neurovascularly intact       ASSESSMENT & PLAN:   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  Having sensation in distribution of the femoral cutaneous nerve.  Does not indicate to be coming from the hip joint.  Possible to have a lumbar contribution. -Counseled on home exercise therapy and supportive care. -Prednisone. -Counseled on adjusting wardrobe. -Could consider physical therapy or injection.

## 2022-02-15 NOTE — Patient Instructions (Addendum)
Good to see you Please try heat on the back  Please try avoiding compression on the waistline.  Please send me a message in MyChart with any questions or updates.  Please see me back in 4 weeks.   --Dr. Raeford Razor

## 2022-02-15 NOTE — Assessment & Plan Note (Signed)
Acutely occurring.  Having sensation in distribution of the femoral cutaneous nerve.  Does not indicate to be coming from the hip joint.  Possible to have a lumbar contribution. -Counseled on home exercise therapy and supportive care. -Prednisone. -Counseled on adjusting wardrobe. -Could consider physical therapy or injection.

## 2022-02-16 ENCOUNTER — Encounter: Payer: Self-pay | Admitting: Obstetrics and Gynecology

## 2022-02-16 DIAGNOSIS — N809 Endometriosis, unspecified: Secondary | ICD-10-CM

## 2022-02-17 MED ORDER — ORILISSA 150 MG PO TABS: 1.0000 | ORAL_TABLET | Freq: Every day | ORAL | 12 refills | Status: DC

## 2022-02-20 ENCOUNTER — Telehealth: Payer: Self-pay

## 2022-02-20 NOTE — Telephone Encounter (Signed)
Prior authorization started with Jiles Garter at [facility] for Orilissa 150 mg 1 tablet daily. Per Jiles Garter the medication was approved. Case ID: [id number] Curtiss Mahmood l Addalyne Vandehei, CMA

## 2022-02-21 ENCOUNTER — Telehealth: Payer: Commercial Managed Care - HMO | Admitting: Physician Assistant

## 2022-02-21 DIAGNOSIS — R030 Elevated blood-pressure reading, without diagnosis of hypertension: Secondary | ICD-10-CM | POA: Diagnosis not present

## 2022-02-21 DIAGNOSIS — R519 Headache, unspecified: Secondary | ICD-10-CM

## 2022-02-21 NOTE — Patient Instructions (Signed)
  Debra Pruitt, thank you for joining [name], PA-C for today's virtual visit.  While this provider is not your primary care provider (PCP), if your PCP is located in our provider database this encounter information will be shared with them immediately following your visit.  Consent: (Patient) Debra Pruitt provided verbal consent for this virtual visit at the beginning of the encounter.  Current Medications:  Current Outpatient Medications:    albuterol (VENTOLIN HFA) 108 (90 Base) MCG/ACT inhaler, Inhale 1-2 puffs into the lungs every 6 (six) hours as needed for wheezing or shortness of breath., Disp: 18 g, Rfl: 0   Budeson-Glycopyrrol-Formoterol (BREZTRI AEROSPHERE) 160-9-4.8 MCG/ACT AERO, Inhale 2 puffs into the lungs in the morning and at bedtime., Disp: 10.7 g, Rfl: 6   lactobacillus acidophilus (BACID) TABS tablet, Take 2 tablets by mouth 3 (three) times daily., Disp: , Rfl:    polycarbophil (FIBERCON) 625 MG tablet, Take 625 mg by mouth daily., Disp: , Rfl:    polyethylene glycol (MIRALAX / GLYCOLAX) 17 g packet, Take 17 g by mouth daily., Disp: , Rfl:    Rimegepant Sulfate (NURTEC) 75 MG TBDP, Take 1 tablet as needed at onset of migraine. May take second dose after 24 hours. Do not take more than 3 a week (Patient not taking: Reported on 12/19/2021), Disp: 10 tablet, Rfl: 5   Medications ordered in this encounter:  No orders of the defined types were placed in this encounter.    *If you need refills on other medications prior to your next appointment, please contact your pharmacy*  Follow-Up: Call back or seek an in-person evaluation if the symptoms worsen or if the condition fails to improve as anticipated.  [facility] [phone]  Other Instructions Please use link below to be evaluated in person.  Anything acutely worsening, please be evaluated at nearest ER.   If you have been instructed to have an in-person evaluation today at a local  Urgent Care facility, please use the link below. It will take you to a list of all of our available [facility], including address, phone number and hours of operation. Please do not delay care.  [facility]  If you or a family member do not have a primary care provider, use the link below to schedule a visit and establish care. When you choose a [facility] primary care physician or advanced practice provider, you gain a long-term partner in health. Find a Primary Care Provider  Learn more about [facility] in-office and virtual care options: [facility]

## 2022-02-21 NOTE — Progress Notes (Signed)
Virtual Visit Consent   Debra Pruitt, you are scheduled for a virtual visit with a [facility] provider today. Just as with appointments in the office, your consent must be obtained to participate. Your consent will be active for this visit and any virtual visit you may have with one of our providers in the next 365 days. If you have a MyChart account, a copy of this consent can be sent to you electronically.  As this is a virtual visit, video technology does not allow for your provider to perform a traditional examination. This may limit your provider's ability to fully assess your condition. If your provider identifies any concerns that need to be evaluated in person or the need to arrange testing (such as labs, EKG, etc.), we will make arrangements to do so. Although advances in technology are sophisticated, we cannot ensure that it will always work on either your end or our end. If the connection with a video visit is poor, the visit may have to be switched to a telephone visit. With either a video or telephone visit, we are not always able to ensure that we have a secure connection.  By engaging in this virtual visit, you consent to the provision of healthcare and authorize for your insurance to be billed (if applicable) for the services provided during this visit. Depending on your insurance coverage, you may receive a charge related to this service.  I need to obtain your verbal consent now. Are you willing to proceed with your visit today? Debra Pruitt has provided verbal consent on 02/21/2022 for a virtual visit (video or telephone). [name], [place]  Date: 02/21/2022 3:31 PM  Virtual Visit via Video Note   I, [name], connected with  Debra Pruitt  ([id number], 11/02/1990) on 02/21/22 at  3:15 PM EDT by a video-enabled telemedicine application and verified that I am speaking with the correct person using two identifiers.  Location: Patient: Virtual Visit Location  Patient: Home Provider: Virtual Visit Location Provider: Home Office   I discussed the limitations of evaluation and management by telemedicine and the availability of in person appointments. The patient expressed understanding and agreed to proceed.    History of Present Illness: Debra Pruitt is a 31 y.o. who identifies as a female who was assigned female at birth, and is being seen today for elevated BP measurements with a new headache over past couple of days. Has history of migraine headache but this is different for her. Is a sensation of pressure with some occasional feeling off balance. BP 140/93. Denies chest pain, racing heart, dizziness. Notes headache is mainly L sided with throbbing. Denies change to diet or activity. Denies change in stress levels.     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   Melanocytic nevus of left lower extremity 08/30/2021   Localization-related idiopathic epilepsy and epileptic syndromes with seizures of localized onset, not intractable, without status epilepticus ([place]) 07/28/2021   Migraine headache without aura 07/28/2021   Mild persistent asthma 07/28/2021   Food intolerance 03/10/2021   Cervical radiculopathy 05/31/2020   Vitamin D deficiency 01/09/2020    Allergies:  Allergies  Allergen Reactions   Mangifera Indica Swelling   Mango Flavor Swelling   Pineapple Swelling   Other Hives, Itching and Rash    Goli Nutrition supplement   Medications:  Current Outpatient Medications:    albuterol (VENTOLIN HFA)  108 (90 Base) MCG/ACT inhaler, Inhale 1-2 puffs into the lungs every 6 (six) hours as needed for wheezing or shortness of breath., Disp: 18 g, Rfl: 0   Budeson-Glycopyrrol-Formoterol (BREZTRI AEROSPHERE) 160-9-4.8 MCG/ACT AERO, Inhale 2 puffs into the lungs in the morning and at bedtime., Disp: 10.7 g, Rfl: 6   lactobacillus acidophilus  (BACID) TABS tablet, Take 2 tablets by mouth 3 (three) times daily., Disp: , Rfl:    polycarbophil (FIBERCON) 625 MG tablet, Take 625 mg by mouth daily., Disp: , Rfl:    polyethylene glycol (MIRALAX / GLYCOLAX) 17 g packet, Take 17 g by mouth daily., Disp: , Rfl:    Rimegepant Sulfate (NURTEC) 75 MG TBDP, Take 1 tablet as needed at onset of migraine. May take second dose after 24 hours. Do not take more than 3 a week (Patient not taking: Reported on 12/19/2021), Disp: 10 tablet, Rfl: 5  Observations/Objective: Patient is well-developed, well-nourished in no acute distress.  Resting comfortably at home.  Head is normocephalic, atraumatic.  No labored breathing. Speech is clear and coherent with logical content.  Patient is alert and oriented at baseline.   Assessment and Plan: 1. New onset headache  2. Elevated BP without diagnosis of hypertension  Giving new onset high BP associated with a "new" headache, needs evaluation in person. No alarm signs/symptoms present so sent for UC evaluation.   Follow Up Instructions: I discussed the assessment and treatment plan with the patient. The patient was provided an opportunity to ask questions and all were answered. The patient agreed with the plan and demonstrated an understanding of the instructions.  A copy of instructions were sent to the patient via MyChart unless otherwise noted below.   The patient was advised to call back or seek an in-person evaluation if the symptoms worsen or if the condition fails to improve as anticipated.  Time:  I spent 10 minutes with the patient via telehealth technology discussing the above problems/concerns.    [name], PA-C

## 2022-02-27 ENCOUNTER — Ambulatory Visit: Payer: 59 | Admitting: Pulmonary Disease

## 2022-03-02 ENCOUNTER — Ambulatory Visit (INDEPENDENT_AMBULATORY_CARE_PROVIDER_SITE_OTHER): Payer: Self-pay | Admitting: Family Medicine

## 2022-03-02 ENCOUNTER — Encounter: Payer: Self-pay | Admitting: Family Medicine

## 2022-03-02 ENCOUNTER — Institutional Professional Consult (permissible substitution): Payer: 59 | Admitting: Plastic Surgery

## 2022-03-02 VITALS — BP 120/72 | HR 97 | Temp 98.3°F | Ht 65.0 in | Wt 173.8 lb

## 2022-03-02 DIAGNOSIS — G43009 Migraine without aura, not intractable, without status migrainosus: Secondary | ICD-10-CM

## 2022-03-02 DIAGNOSIS — U071 COVID-19: Secondary | ICD-10-CM

## 2022-03-02 DIAGNOSIS — J4530 Mild persistent asthma, uncomplicated: Secondary | ICD-10-CM

## 2022-03-02 NOTE — Progress Notes (Signed)
[facility] [address] [place] [address] Dept: [phone] Dept Fax: [phone]  Chronic Care Office Visit  Subjective:    Patient ID: Debra Pruitt, female    DOB: August 15, 1990, 31 y.o..   MRN: [id number]  Chief Complaint  Patient presents with   Follow-up    6 month f/u. C/o having sinus drainage/HA x 1 week. Has taken Sudafed PE.   Declines flu shot today.     History of Present Illness:  Patient is in today for reassessment of chronic medical issues.  Debra Pruitt has a history of mild persistent asthma. She is followed by Debra Pruitt (pulmonology). This Spring, she was switched from Symbicort to [place]. She also uses PRN albuterol twice a day.   Debra Pruitt has a history of migraine headaches. She Had been managed on rimegipant (Nurtec) by Debra Pruitt. She notes she underwent a change in her insurance, so she has not been back to see her neurologist and has been out of her rimegepant. She now is established on [employer].   Debra Pruitt was seen on 9/26 for a video visit with Debra Pruitt. She had complained of a "new" type of headache that was associated with increased blood pressure. He had advised she have an in-person evaluation. She notes that earlier this week she started having some sinus drainage and low-grade fever. She admits to sore throat, but denies any loss of appetite, body aches, or nausea.  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   Melanocytic nevus of left lower extremity 08/30/2021   Localization-related idiopathic epilepsy and epileptic syndromes with seizures of localized onset, not intractable, without status epilepticus ([place]) 07/28/2021   Migraine headache without aura 07/28/2021   Mild persistent asthma 07/28/2021   Food intolerance 03/10/2021   Cervical radiculopathy 05/31/2020   Vitamin D  deficiency 01/09/2020   Past Surgical History:  Procedure Laterality Date   CESAREAN SECTION  04/30/2013   x 2   MULTIPLE TOOTH EXTRACTIONS     SALPINGECTOMY Right    for ectopic pregnancy   Family History  Problem Relation Age of Onset   Hypertension Mother    Thyroid disease Mother    HIV Father    Cirrhosis Father    Cancer Maternal Grandmother        Breast   Asthma Maternal Grandmother    Hypertension Maternal Grandfather    Heart disease Maternal Grandfather    Diabetes Maternal Grandfather    Hypertension Paternal Grandmother    COPD Paternal Grandmother    Heart disease Paternal Grandmother    Hyperlipidemia Paternal Grandmother    Stroke Paternal Grandmother    Diabetes Paternal Grandmother    Hepatitis C Paternal Grandmother    Rheum arthritis Paternal Grandmother    Outpatient Medications Prior to Visit  Medication Sig Dispense Refill   albuterol (VENTOLIN HFA) 108 (90 Base) MCG/ACT inhaler Inhale 1-2 puffs into the lungs every 6 (six) hours as needed for wheezing or shortness of breath. 18 g 0   Budeson-Glycopyrrol-Formoterol (BREZTRI AEROSPHERE) 160-9-4.8 MCG/ACT AERO Inhale 2 puffs into the lungs in the morning and at bedtime. 10.7 g 6   polycarbophil (FIBERCON) 625 MG tablet Take 625 mg by mouth daily.     polyethylene glycol (MIRALAX / GLYCOLAX) 17 g packet Take 17 g by mouth daily.     Rimegepant Sulfate (NURTEC) 75 MG TBDP Take 1  tablet as needed at onset of migraine. May take second dose after 24 hours. Do not take more than 3 a week (Patient not taking: Reported on 12/19/2021) 10 tablet 5   lactobacillus acidophilus (BACID) TABS tablet Take 2 tablets by mouth 3 (three) times daily.     No facility-administered medications prior to visit.   Allergies  Allergen Reactions   Mangifera Indica Swelling   Mango Flavor Swelling   Pineapple Swelling   Other Hives, Itching and Rash    Goli Nutrition supplement    Objective:   Today's Vitals   03/02/22  0913  BP: 120/72  Pulse: 97  Temp: 98.3 F (36.8 C)  TempSrc: Temporal  SpO2: 98%  Weight: 173 lb 12.8 oz (78.8 kg)  Height: '5\' 5"'$  (1.651 m)   Body mass index is 28.92 kg/m.   General: Well developed, well nourished. No acute distress. HEENT: Normocephalic, non-traumatic. External ears normal. EAC and TMs normal bilaterally.   PERRL, EOMI. Conjunctiva clear. Nose clear without congestion or rhinorrhea. Mucous membranes   moist. Oropharynx clear. Good dentition. Neck: Supple. No lymphadenopathy. No thyromegaly. Lungs: Clear to auscultation bilaterally. No wheezing, rales or rhonchi. CV: RRR without murmurs or rubs. Pulses 2+ bilaterally. Psych: Alert and oriented. Normal mood and affect.  There are no preventive care reminders to display for this patient.    Assessment & Plan:   1. COVID Reviewed home care instructions for COVID. I would count 10/3 as Day 0. Advised self-isolation at home for at least 5 days. After 5 days, if improved and fever resolved, can be in public, but should wear a mask around others for an additional 5 days. We discussed Paxlovid. She is at low risk for complications, so opts out of this. If symptoms, esp, dyspnea develops/worsens, recommend in-person evaluation at either an urgent care or the emergency room.  2. Migraine without aura and without status migrainosus, not intractable I recommend Debra Pruitt call for a follow-up with Debra Pruitt now that her insurance is back in place. She should get back on her Nurtec. Her blood pressure is normal today, so I see no need for further intervention related to that.  3. Mild persistent asthma without complication Continue Breztri, 2 puffs bid and PRN albuterol.  Return in about 6 months (around 09/01/2022) for Reassessment.   Haydee Salter, MD

## 2022-03-14 ENCOUNTER — Ambulatory Visit: Payer: Commercial Managed Care - HMO | Admitting: Family Medicine

## 2022-03-14 IMAGING — DX DG TOE GREAT 2+V*R*
3 series · 3 of 3 positions shown · non-contrast
Comparison: None.

CLINICAL DATA: Great toe fracture. Reassess fracture of right great
toe. Injury Memorial Day weekend (2 and half weeks ago).

EXAM:
RIGHT GREAT TOE

[toe ap]
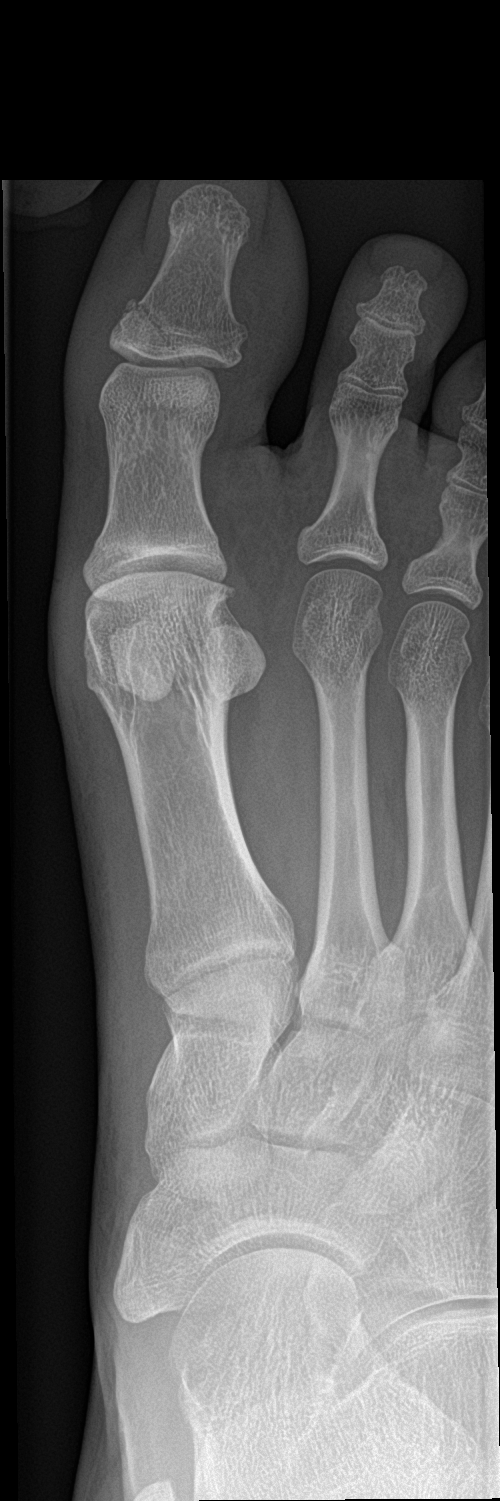

[toe obl]
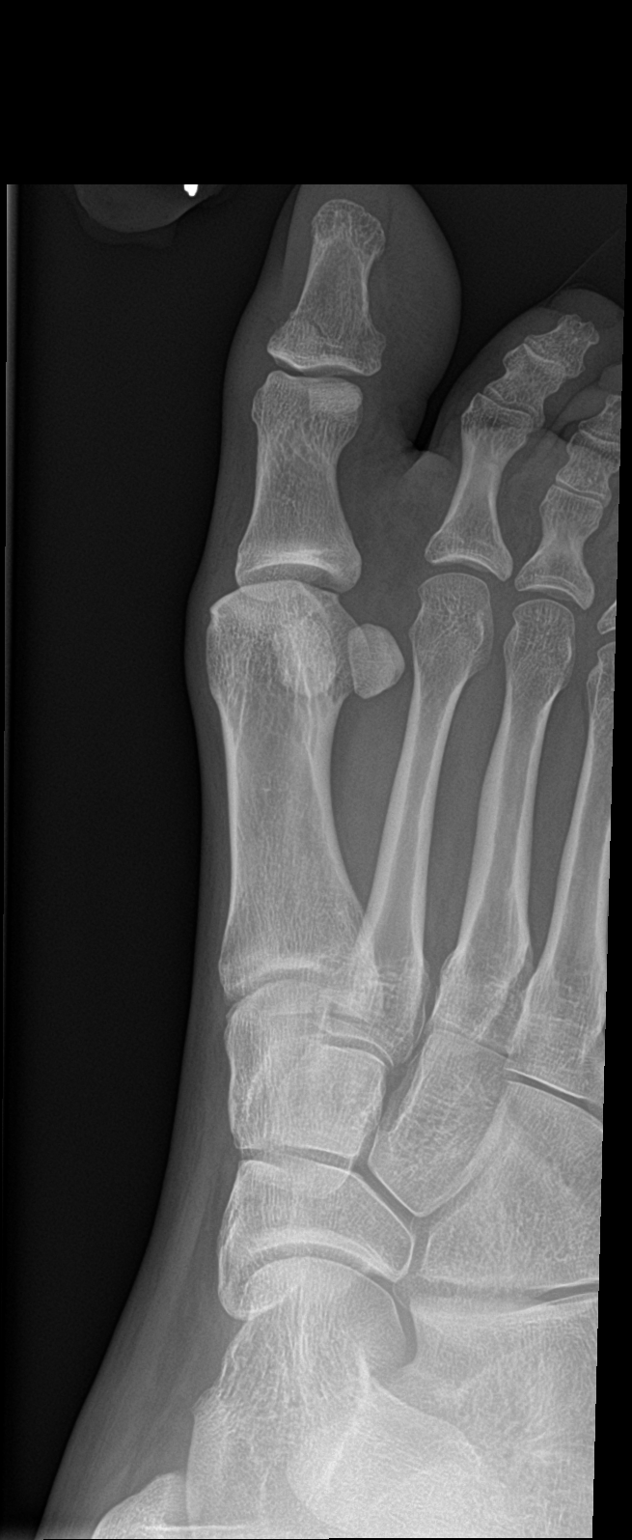

[toe lat]
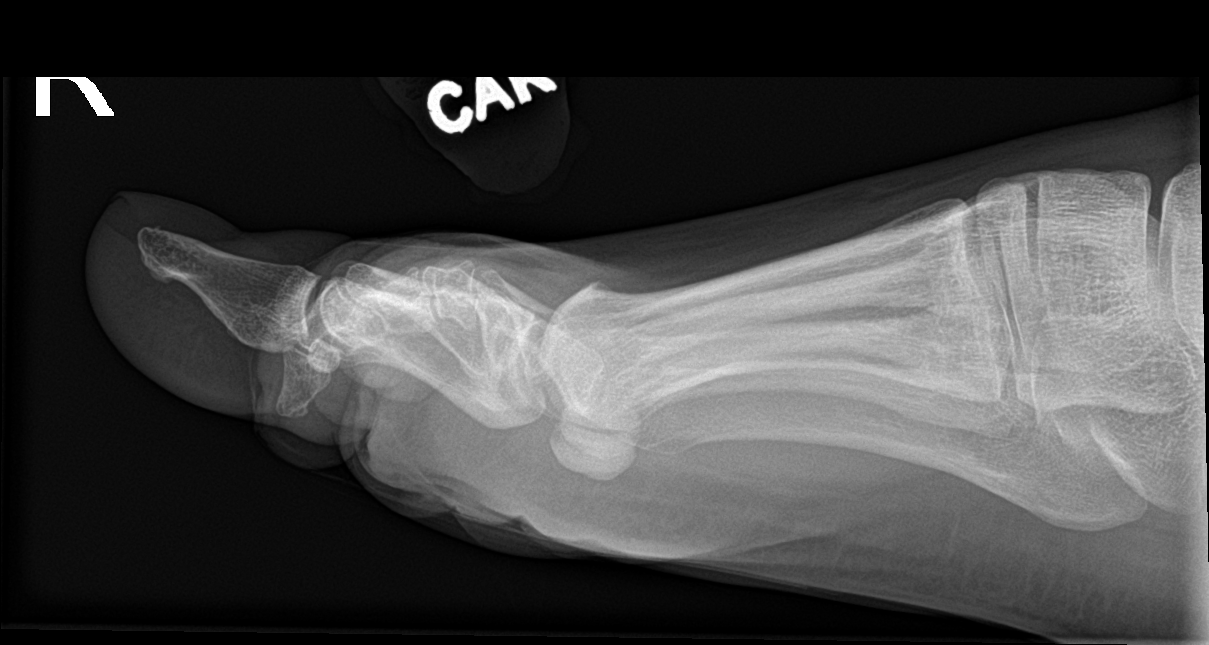

[3 of 3 positions shown; findings below may reference images not displayed]

FINDINGS: Initial radiographs not available. There is an oblique nondisplaced
fracture of the great toe distal phalanx extending to the
interphalangeal joint. Minimal early bridging callus formation
however the fracture line remains readily visible. No other fracture
of the toe.
IMPRESSION: Oblique nondisplaced great toe distal phalanx fracture extending to
the interphalangeal joint with minimal early bridging callus
formation, fracture line remains readily visible.

## 2022-03-28 ENCOUNTER — Ambulatory Visit: Payer: 59 | Admitting: Pulmonary Disease

## 2022-04-12 ENCOUNTER — Encounter: Payer: Self-pay | Admitting: Gastroenterology

## 2022-05-03 ENCOUNTER — Encounter: Payer: Self-pay | Admitting: Obstetrics and Gynecology

## 2022-05-08 DIAGNOSIS — M5431 Sciatica, right side: Secondary | ICD-10-CM | POA: Diagnosis not present

## 2022-05-08 DIAGNOSIS — M545 Low back pain, unspecified: Secondary | ICD-10-CM | POA: Diagnosis not present

## 2022-05-08 DIAGNOSIS — R109 Unspecified abdominal pain: Secondary | ICD-10-CM | POA: Diagnosis not present

## 2022-05-08 DIAGNOSIS — M62838 Other muscle spasm: Secondary | ICD-10-CM | POA: Diagnosis not present

## 2022-05-08 DIAGNOSIS — R350 Frequency of micturition: Secondary | ICD-10-CM | POA: Diagnosis not present

## 2022-05-15 ENCOUNTER — Ambulatory Visit: Payer: Self-pay | Admitting: Obstetrics and Gynecology

## 2022-05-18 DIAGNOSIS — D485 Neoplasm of uncertain behavior of skin: Secondary | ICD-10-CM | POA: Diagnosis not present

## 2022-05-18 DIAGNOSIS — D2272 Melanocytic nevi of left lower limb, including hip: Secondary | ICD-10-CM | POA: Diagnosis not present

## 2022-05-24 ENCOUNTER — Ambulatory Visit: Payer: 59 | Admitting: Gastroenterology

## 2022-06-21 ENCOUNTER — Ambulatory Visit: Payer: 59 | Admitting: Gastroenterology

## 2022-08-07 ENCOUNTER — Encounter: Payer: Self-pay | Admitting: Gastroenterology

## 2022-08-18 DIAGNOSIS — Z01419 Encounter for gynecological examination (general) (routine) without abnormal findings: Secondary | ICD-10-CM | POA: Diagnosis not present

## 2022-08-18 DIAGNOSIS — Z113 Encounter for screening for infections with a predominantly sexual mode of transmission: Secondary | ICD-10-CM | POA: Diagnosis not present

## 2022-08-18 DIAGNOSIS — R87615 Unsatisfactory cytologic smear of cervix: Secondary | ICD-10-CM | POA: Diagnosis not present

## 2022-08-18 DIAGNOSIS — N920 Excessive and frequent menstruation with regular cycle: Secondary | ICD-10-CM | POA: Diagnosis not present

## 2022-08-18 DIAGNOSIS — Z124 Encounter for screening for malignant neoplasm of cervix: Secondary | ICD-10-CM | POA: Diagnosis not present

## 2022-08-31 ENCOUNTER — Ambulatory Visit
Admission: EM | Admit: 2022-08-31 | Discharge: 2022-08-31 | Disposition: A | Discharge status: Home / Self Care | Payer: 59 | Attending: Internal Medicine | Admitting: Internal Medicine

## 2022-08-31 DIAGNOSIS — L089 Local infection of the skin and subcutaneous tissue, unspecified: Secondary | ICD-10-CM | POA: Diagnosis not present

## 2022-08-31 DIAGNOSIS — S40862A Insect bite (nonvenomous) of left upper arm, initial encounter: Secondary | ICD-10-CM

## 2022-08-31 DIAGNOSIS — W57XXXA Bitten or stung by nonvenomous insect and other nonvenomous arthropods, initial encounter: Secondary | ICD-10-CM | POA: Diagnosis not present

## 2022-08-31 MED ORDER — CEPHALEXIN 500 MG PO CAPS: 500.0000 mg | ORAL_CAPSULE | Freq: Three times a day (TID) | ORAL | 0 refills | Status: AC

## 2022-08-31 MED ORDER — FLUCONAZOLE 150 MG PO TABS: 150.0000 mg | ORAL_TABLET | ORAL | 0 refills | Status: AC

## 2022-08-31 NOTE — Discharge Instructions (Addendum)
Keflex antibiotic every 8 hours (3 times daily with breakfast, lunch, and dinner) for the next 7 days.  You may purchase over-the-counter hydrocortisone cream and use this to the insect bite to help with itching.  Apply warm compresses frequently to help with infection.  If you notice worsening spreading of the redness, swelling, or warmth while on antibiotic or if you develop a fever, chills, or bodyaches/pains/generalized fatigue, I need you to return to urgent care/go to the emergency department for further evaluation.  I hope you feel better!

## 2022-08-31 NOTE — ED Triage Notes (Signed)
Pt states she was bit by red ants two days ago on her left forearm.  States it was red and swollen and itchy. Slight swelling and redness noted.

## 2022-08-31 NOTE — ED Provider Notes (Signed)
[name]    CSN: [id number] Arrival date & time: 08/31/22  1708      History   Chief Complaint Chief Complaint  Patient presents with   Insect Bite    HPI Debra Pruitt is a 32 y.o. female.   Patient presents to urgent care for evaluation of insect bite to the left lateral proximal forearm that happened 2 days ago on Tuesday, August 29, 2022.  Her daughter stepped in a nest of unknown flying insects and she believes that one of the insects bit her on her left forearm.  The area began to swell and become warm yesterday.  She states the swelling has improved significantly, however she has noticed white/yellow drainage coming from the site with pressure.  No recent trauma/injury to the left forearm.  She states the site is very itchy.  Denies fever, chills, body aches, generalized weakness, generalized fatigue, and numbness/tingling to the left upper extremity.  No heart palpitations.  No recent antibiotic or steroid use.  Denies pain to the site, states is very itchy.     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   Melanocytic nevus of left lower extremity 08/30/2021   Localization-related idiopathic epilepsy and epileptic syndromes with seizures of localized onset, not intractable, without status epilepticus 07/28/2021   Migraine headache without aura 07/28/2021   Mild persistent asthma 07/28/2021   Food intolerance 03/10/2021   Cervical radiculopathy 05/31/2020   Vitamin D deficiency 01/09/2020    Past Surgical History:  Procedure Laterality Date   CESAREAN SECTION  04/30/2013   x 2   MULTIPLE TOOTH EXTRACTIONS     SALPINGECTOMY Right    for  ectopic pregnancy    OB History     Gravida  3   Para  2   Term  2   Preterm      AB  1   Living  2      SAB      IAB      Ectopic  1   Multiple      Live Births  2            Home Medications    Prior to Admission medications   Medication Sig Start Date End Date Taking? Authorizing Provider  cephALEXin (KEFLEX) 500 MG capsule Take 1 capsule (500 mg total) by mouth 3 (three) times daily for 7 days. 08/31/22 09/07/22 Yes Talbot Grumbling, FNP  fluconazole (DIFLUCAN) 150 MG tablet Take 1 tablet (150 mg total) by mouth every 3 (three) days for 2 days. 08/31/22 09/02/22 Yes Talbot Grumbling, FNP  albuterol (VENTOLIN HFA) 108 (90 Base) MCG/ACT inhaler Inhale 1-2 puffs into the lungs every 6 (six) hours as needed for wheezing or shortness of breath. 03/22/21   Lynden Oxford Scales, PA-C  Budeson-Glycopyrrol-Formoterol (BREZTRI AEROSPHERE) 160-9-4.8 MCG/ACT AERO Inhale 2 puffs into the lungs in the morning and at bedtime. 01/31/22   Hunsucker, Bonna Gains, MD  polycarbophil (FIBERCON) 625 MG tablet Take 625 mg by mouth daily.    [provider]  polyethylene glycol (MIRALAX /  GLYCOLAX) 17 g packet Take 17 g by mouth daily.    [provider]  Rimegepant Sulfate (NURTEC) 75 MG TBDP Take 1 tablet as needed at onset of migraine. May take second dose after 24 hours. Do not take more than 3 a week Patient not taking: Reported on 12/19/2021 08/25/21   Cameron Sprang, MD    Family History Family History  Problem Relation Age of Onset   Hypertension Mother    Thyroid disease Mother    HIV Father    Cirrhosis Father    Cancer Maternal Grandmother        Breast   Asthma Maternal Grandmother    Hypertension Maternal Grandfather    Heart disease Maternal Grandfather    Diabetes Maternal Grandfather    Hypertension Paternal Grandmother    COPD Paternal Grandmother    Heart disease Paternal Grandmother    Hyperlipidemia Paternal Grandmother    Stroke  Paternal Grandmother    Diabetes Paternal Grandmother    Hepatitis C Paternal Grandmother    Rheum arthritis Paternal Grandmother     Social History Social History   Tobacco Use   Smoking status: Never   Smokeless tobacco: Never  Vaping Use   Vaping Use: Never used  Substance Use Topics   Alcohol use: Yes    Alcohol/week: 0.0 standard drinks of alcohol    Comment: Rare - social   Drug use: No     Allergies   Mangifera indica, Mango flavor, Pineapple, and Other   Review of Systems Review of Systems Per HPI  Physical Exam Triage Vital Signs ED Triage Vitals  Enc Vitals Group     BP 08/31/22 1730 117/82     Pulse Rate 08/31/22 1730 70     Resp 08/31/22 1730 16     Temp 08/31/22 1730 97.7 F (36.5 C)     Temp Source 08/31/22 1730 Oral     SpO2 08/31/22 1730 98 %     Weight --      Height --      Head Circumference --      Peak Flow --      Pain Score 08/31/22 1731 2     Pain Loc --      Pain Edu? --      Excl. in [place]? --    No data found.  Updated Vital Signs BP 117/82 (BP Location: Left Arm)   Pulse 70   Temp 97.7 F (36.5 C) (Oral)   Resp 16   LMP 08/22/2022 (Exact Date)   SpO2 98%   Visual Acuity Right Eye Distance:   Left Eye Distance:   Bilateral Distance:    Right Eye Near:   Left Eye Near:    Bilateral Near:     Physical Exam Vitals and nursing note reviewed.  Constitutional:      Appearance: She is not ill-appearing or toxic-appearing.  HENT:     Head: Normocephalic and atraumatic.     Right Ear: Hearing and external ear normal.     Left Ear: Hearing and external ear normal.     Nose: Nose normal.     Mouth/Throat:     Lips: Pink.  Eyes:     General: Lids are normal. Vision grossly intact. Gaze aligned appropriately.     Extraocular Movements: Extraocular movements intact.     Conjunctiva/sclera: Conjunctivae normal.  Pulmonary:     Effort: Pulmonary effort is normal.  Musculoskeletal:     Cervical back: Neck supple.  Skin:     General: Skin is warm and dry.     Capillary Refill: Capillary refill takes less than 2 seconds.     Findings: No rash.     Comments: Insect bite to the left lateral proximal forearm that appears erythematous with surrounding area of erythema and underlying soft tissue swelling.  Area warm to palpation.  +2 bilateral radial pulses with less than 3 capillary refill to the bilateral upper extremities.  See picture below for further details.  No drainage currently to the site, however there is cellulitis.  Neurological:     General: No focal deficit present.     Mental Status: She is alert and oriented to person, place, and time. Mental status is at baseline.     Cranial Nerves: No dysarthria or facial asymmetry.  Psychiatric:        Mood and Affect: Mood normal.        Speech: Speech normal.        Behavior: Behavior normal.        Thought Content: Thought content normal.        Judgment: Judgment normal.      UC Treatments / Results  Labs (all labs ordered are listed, but only abnormal results are displayed) Labs Reviewed - No data to display  EKG   Radiology No results found.  Procedures Procedures (including critical care time)  Medications Ordered in UC Medications - No data to display  Initial Impression / Assessment and Plan / UC Course  I have reviewed the triage vital signs and the nursing notes.  Pertinent labs & imaging results that were available during my care of the patient were reviewed by me and considered in my medical decision making (see chart for details).   1.  Infected insect bite of the left upper extremity Insect bite to the left upper extremity at the forearm appears infected.  Keflex antibiotic every 8 hours for the next 7 days sent to pharmacy to be taken as prescribed.  Patient reports frequent vaginal yeast infections associated with antibiotic use and is requesting prophylactic treatment with Diflucan.  Diflucan sent to be taken as prescribed once  today, then again in 3 days for persistent symptoms.  May use warm compresses to the left forearm.  May use over-the-counter hydrocortisone cream for itching as needed but has been advised to avoid using this for longer than 7 days due to risks of skin thinning associated with use.  May use Tylenol and ibuprofen as needed for any discomfort to the left upper extremity.  Strict ER and urgent care return precautions discussed should swelling or redness worsen or should she develop any systemic symptoms.  Discussed physical exam and available lab work findings in clinic with patient.  Counseled patient regarding appropriate use of medications and potential side effects for all medications recommended or prescribed today. Discussed red flag signs and symptoms of worsening condition,when to call the PCP office, return to urgent care, and when to seek higher level of care in the emergency department. Patient verbalizes understanding and agreement with plan. All questions answered. Patient discharged in stable condition.    Final Clinical Impressions(s) / UC Diagnoses   Final diagnoses:  Infected insect bite of left upper extremity, initial encounter     Discharge Instructions      Keflex antibiotic every 8 hours (3 times daily with breakfast, lunch, and dinner) for the next 7 days.  You may purchase over-the-counter hydrocortisone cream and use this to  the insect bite to help with itching.  Apply warm compresses frequently to help with infection.  If you notice worsening spreading of the redness, swelling, or warmth while on antibiotic or if you develop a fever, chills, or bodyaches/pains/generalized fatigue, I need you to return to urgent care/go to the emergency department for further evaluation.  I hope you feel better!   ED Prescriptions     Medication Sig Dispense Auth. Provider   cephALEXin (KEFLEX) 500 MG capsule Take 1 capsule (500 mg total) by mouth 3 (three) times daily for 7 days.  21 capsule Joella Prince M, FNP   fluconazole (DIFLUCAN) 150 MG tablet Take 1 tablet (150 mg total) by mouth every 3 (three) days for 2 days. 2 tablet Talbot Grumbling, FNP      PDMP not reviewed this encounter.   Talbot Grumbling, [place] 08/31/22 Bosie Helper

## 2022-09-11 ENCOUNTER — Encounter: Payer: Self-pay | Admitting: *Deleted

## 2022-09-12 DIAGNOSIS — R102 Pelvic and perineal pain: Secondary | ICD-10-CM | POA: Diagnosis not present

## 2022-09-12 DIAGNOSIS — M7918 Myalgia, other site: Secondary | ICD-10-CM | POA: Diagnosis not present

## 2022-09-12 DIAGNOSIS — N920 Excessive and frequent menstruation with regular cycle: Secondary | ICD-10-CM | POA: Diagnosis not present

## 2022-09-12 DIAGNOSIS — Z124 Encounter for screening for malignant neoplasm of cervix: Secondary | ICD-10-CM | POA: Diagnosis not present

## 2022-09-12 DIAGNOSIS — N946 Dysmenorrhea, unspecified: Secondary | ICD-10-CM | POA: Diagnosis not present

## 2022-09-18 ENCOUNTER — Ambulatory Visit: Payer: 59 | Admitting: Gastroenterology

## 2022-09-27 DIAGNOSIS — D485 Neoplasm of uncertain behavior of skin: Secondary | ICD-10-CM | POA: Diagnosis not present

## 2022-09-27 DIAGNOSIS — L638 Other alopecia areata: Secondary | ICD-10-CM | POA: Diagnosis not present

## 2022-10-05 ENCOUNTER — Telehealth: Payer: 59 | Admitting: Physician Assistant

## 2022-10-05 DIAGNOSIS — S99922A Unspecified injury of left foot, initial encounter: Secondary | ICD-10-CM

## 2022-10-05 MED ORDER — ETODOLAC 200 MG PO CAPS
200.0000 mg | ORAL_CAPSULE | Freq: Three times a day (TID) | ORAL | 0 refills | Status: DC
Start: 2022-10-05 — End: 2022-11-29

## 2022-10-05 NOTE — Progress Notes (Signed)
Virtual Visit Consent   Debra Pruitt, you are scheduled for a virtual visit with a [facility] provider today. Just as with appointments in the office, your consent must be obtained to participate. Your consent will be active for this visit and any virtual visit you may have with one of our providers in the next 365 days. If you have a MyChart account, a copy of this consent can be sent to you electronically.  As this is a virtual visit, video technology does not allow for your provider to perform a traditional examination. This may limit your provider's ability to fully assess your condition. If your provider identifies any concerns that need to be evaluated in person or the need to arrange testing (such as labs, EKG, etc.), we will make arrangements to do so. Although advances in technology are sophisticated, we cannot ensure that it will always work on either your end or our end. If the connection with a video visit is poor, the visit may have to be switched to a telephone visit. With either a video or telephone visit, we are not always able to ensure that we have a secure connection.  By engaging in this virtual visit, you consent to the provision of healthcare and authorize for your insurance to be billed (if applicable) for the services provided during this visit. Depending on your insurance coverage, you may receive a charge related to this service.  I need to obtain your verbal consent now. Are you willing to proceed with your visit today? Debra Pruitt has provided verbal consent on 10/05/2022 for a virtual visit (video or telephone). Debra Pruitt, [place]  Date: 10/05/2022 5:07 PM  Virtual Visit via Video Note   I, Debra Pruitt, connected with  Debra Pruitt  ([id number], [birth year]) on 10/05/22 at  4:45 PM EDT by a video-enabled telemedicine application and verified that I am speaking with the correct person using two identifiers.  Location: Patient: Virtual Visit Location  Patient: Home Provider: Virtual Visit Location Provider: Home Office   I discussed the limitations of evaluation and management by telemedicine and the availability of in person appointments. The patient expressed understanding and agreed to proceed.    History of Present Illness: Debra Pruitt is a 32 y.o. who identifies as a female who was assigned female at birth, and is being seen today for injury to L great toe, occurring yesterday evening. Notes she and her husband were assembling some new furniture, chaise flew back when unwrapped and hit the end of her L great toe/toenail. Noted immediate pain and tenderness. Injury to distal end of the nail, which she quickly trimmed. Some mild bleeding at the very end of her toe, stopping quickly. Today with throbbing pain in the tip of the toe without numbness/tingling or bruising. Maybe some mild swelling of the toe. Has taken Tylenol and Ibuprofen but without substantial relief.  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   Melanocytic nevus of left lower extremity 08/30/2021   Localization-related idiopathic epilepsy and epileptic syndromes with seizures of localized onset, not intractable, without status epilepticus (HCC) 07/28/2021   Migraine headache without aura 07/28/2021   Mild persistent asthma 07/28/2021   Food intolerance 03/10/2021   Cervical radiculopathy 05/31/2020   Vitamin D deficiency 01/09/2020    Allergies:  Allergies  Allergen Reactions   Mangifera Indica Swelling   Mango Flavor Swelling   Pineapple Swelling  Other Hives, Itching and Rash    [place] Nutrition supplement   Medications:  Current Outpatient Medications:    etodolac (LODINE) 200 MG capsule, Take 1 capsule (200 mg total) by mouth every 8 (eight) hours., Disp: 15 capsule, Rfl: 0   albuterol (VENTOLIN HFA) 108 (90 Base) MCG/ACT inhaler, Inhale 1-2 puffs into the lungs  every 6 (six) hours as needed for wheezing or shortness of breath., Disp: 18 g, Rfl: 0   polycarbophil (FIBERCON) 625 MG tablet, Take 625 mg by mouth daily., Disp: , Rfl:    polyethylene glycol (MIRALAX / GLYCOLAX) 17 g packet, Take 17 g by mouth daily., Disp: , Rfl:   Observations/Objective: Patient is well-developed, well-nourished in no acute distress.  Resting comfortably  at home.  Head is normocephalic, atraumatic.  No labored breathing.  Speech is clear and coherent with logical content.  Patient is alert and oriented at baseline.  L great toe visualized. Distal fifth of nail removed. No nail avulsion noted. No active bleeding or visible laceration/avulsion of skin. Nail is polished so hard to fully assess for more proximal areas f subungual hematoma. Mild swelling at base of nail noted without ecchymosis. Is able to flex the Great toe but with some pain.   Assessment and Plan: 1. Injury of left great toe, initial encounter - etodolac (LODINE) 200 MG capsule; Take 1 capsule (200 mg total) by mouth every 8 (eight) hours.  Dispense: 15 capsule; Refill: 0  Full ROM. No visible ecchymosis. Other than distal end of nail that was trimmed no other nail deformity visualized but presence of polish does make this harder. She is to remove polish so she can get a better look at the nail and follow-up if any other abnormality noted. Will have her ice and elevate the area. Ok to continue Tylenol Etodolac BID per orders. Supportive footwear discussed. She is aware of limitation of this type of evaluation via video visit so if no substantial improvement over next 24-48 hours or any worsening symptoms, she is to seek in person care ASAP.  Follow Up Instructions: I discussed the assessment and treatment plan with the patient. The patient was provided an opportunity to ask questions and all were answered. The patient agreed with the plan and demonstrated an understanding of the instructions.  A copy of  instructions were sent to the patient via MyChart unless otherwise noted below.   The patient was advised to call back or seek an in-person evaluation if the symptoms worsen or if the condition fails to improve as anticipated.  Time:  I spent 10 minutes with the patient via telehealth technology discussing the above problems/concerns.    Debra Climes, PA-C

## 2022-10-05 NOTE — Patient Instructions (Addendum)
  Debra Pruitt, thank you for joining [name], PA-C for today's virtual visit.  While this provider is not your primary care provider (PCP), if your PCP is located in our provider database this encounter information will be shared with them immediately following your visit.   A [facility] MyChart account gives you access to today's visit and all your visits, tests, and labs performed at [facility] " click here if you don't have a [facility] MyChart account or go to [email or web address]  Consent: (Patient) Debra Pruitt provided verbal consent for this virtual visit at the beginning of the encounter.  Current Medications:  Current Outpatient Medications:    albuterol (VENTOLIN HFA) 108 (90 Base) MCG/ACT inhaler, Inhale 1-2 puffs into the lungs every 6 (six) hours as needed for wheezing or shortness of breath., Disp: 18 g, Rfl: 0   Budeson-Glycopyrrol-Formoterol (BREZTRI AEROSPHERE) 160-9-4.8 MCG/ACT AERO, Inhale 2 puffs into the lungs in the morning and at bedtime., Disp: 10.7 g, Rfl: 6   polycarbophil (FIBERCON) 625 MG tablet, Take 625 mg by mouth daily., Disp: , Rfl:    polyethylene glycol (MIRALAX / GLYCOLAX) 17 g packet, Take 17 g by mouth daily., Disp: , Rfl:    Rimegepant Sulfate (NURTEC) 75 MG TBDP, Take 1 tablet as needed at onset of migraine. May take second dose after 24 hours. Do not take more than 3 a week (Patient not taking: Reported on 12/19/2021), Disp: 10 tablet, Rfl: 5   Medications ordered in this encounter:  No orders of the defined types were placed in this encounter.    *If you need refills on other medications prior to your next appointment, please contact your pharmacy*  Follow-Up: Call back or seek an in-person evaluation if the symptoms worsen or if the condition fails to improve as anticipated.  [facility] [phone]  Other Instructions Keep skin clean and dry. I recommend removing nail polish so you can be  mindful of any other nail changes or changes of skin underneath the nail that you can report to [place]. Ice and elevate the area while resting. Ok to continue Tylenol. Use the Lodine (Etodolac) as directed with food. If not improving over next 48 hours or any new/worsening symptoms, please seek an in-person evaluation ASAP.     If you have been instructed to have an in-person evaluation today at a local Urgent Care facility, please use the link below. It will take you to a list of all of our available [facility], including address, phone number and hours of operation. Please do not delay care.  [facility]  If you or a family member do not have a primary care provider, use the link below to schedule a visit and establish care. When you choose a [facility] primary care physician or advanced practice provider, you gain a long-term partner in health. Find a Primary Care Provider  Learn more about [facility] in-office and virtual care options: [facility] - Get Care Now

## 2022-10-20 ENCOUNTER — Encounter: Payer: Self-pay | Admitting: Family Medicine

## 2022-11-06 ENCOUNTER — Ambulatory Visit: Payer: 59 | Admitting: Family Medicine

## 2022-11-06 ENCOUNTER — Telehealth: Payer: Self-pay | Admitting: Family Medicine

## 2022-11-06 NOTE — Telephone Encounter (Signed)
Pt NS 6/10 said family emergency letter sent

## 2022-11-07 NOTE — Telephone Encounter (Signed)
1st missed visit, letter sent, fee waived (family ER)

## 2022-11-26 DIAGNOSIS — B349 Viral infection, unspecified: Secondary | ICD-10-CM | POA: Diagnosis not present

## 2022-11-26 DIAGNOSIS — R350 Frequency of micturition: Secondary | ICD-10-CM | POA: Diagnosis not present

## 2022-11-26 DIAGNOSIS — Z20822 Contact with and (suspected) exposure to covid-19: Secondary | ICD-10-CM | POA: Diagnosis not present

## 2022-11-28 ENCOUNTER — Ambulatory Visit (INDEPENDENT_AMBULATORY_CARE_PROVIDER_SITE_OTHER): Payer: 59 | Admitting: Gastroenterology

## 2022-11-28 ENCOUNTER — Encounter: Payer: Self-pay | Admitting: Gastroenterology

## 2022-11-28 VITALS — BP 122/70 | HR 88 | Ht 65.0 in | Wt 176.0 lb

## 2022-11-28 DIAGNOSIS — K581 Irritable bowel syndrome with constipation: Secondary | ICD-10-CM

## 2022-11-28 DIAGNOSIS — K642 Third degree hemorrhoids: Secondary | ICD-10-CM | POA: Diagnosis not present

## 2022-11-28 DIAGNOSIS — K589 Irritable bowel syndrome without diarrhea: Secondary | ICD-10-CM | POA: Insufficient documentation

## 2022-11-28 MED ORDER — LINACLOTIDE 145 MCG PO CAPS: 145.0000 ug | ORAL_CAPSULE | Freq: Every day | ORAL | 1 refills | Status: DC

## 2022-11-28 NOTE — Patient Instructions (Signed)
_______________________________________________________  If your blood pressure at your visit was 140/90 or greater, please contact your primary care physician to follow up on this.  _______________________________________________________  If you are age 34 or older, your body mass index should be between 23-30. Your Body mass index is 29.29 kg/m. If this is out of the aforementioned range listed, please consider follow up with your Primary Care Provider.  If you are age 39 or younger, your body mass index should be between 19-25. Your Body mass index is 29.29 kg/m. If this is out of the aformentioned range listed, please consider follow up with your Primary Care Provider.   ________________________________________________________  The [facility] providers would like to encourage you to use [facility] to communicate with providers for non-urgent requests or questions.  Due to long hold times on the telephone, sending your provider a message by [facility] may be a faster and more efficient way to get a response.  Please allow 48 business hours for a response.  Please remember that this is for non-urgent requests.  _______________________________________________________   We have sent the following medications to your pharmacy for you to pick up at your convenience: Linzess 145 mg daily   We have sent in a referral to pelvic floor therapy they will contact you with a appointment. Dr Tomasa Rand recommends that you complete a bowel purge (to clean out your bowels). Please do the following: Purchase a bottle of Miralax over the counter as well as a box of 5 mg dulcolax tablets. Take 4 dulcolax tablets. Wait 1 hour. You will then drink 6-8 capfuls of Miralax mixed in an adequate amount of water/juice/gatorade (you may choose which of these liquids to drink) over the next 2-3 hours. You should expect results within 1 to 6 hours after completing the bowel purge.  Thank you for entrusting me with your  care and choosing [facility].  Dr Tomasa Rand

## 2022-11-28 NOTE — Progress Notes (Unsigned)
HPI : Debra Pruitt is a 32 y.o. female who is referred to [place] by Loyola Mast, MD for chronic abdominal pain and constipation. Symptoms started during pregnancy.  Symptoms wax and wane, but have been worse for the past 2 years.  Has 1 BM once a week on average.  Stools are often hard and difficult to pass.  Has problems with hemorrhoids.  She has tried dulcolax, stool softeners, mag citrate, MiraLax (currently, daily for about 2 weeks), Metamucil, detox pills,  With MiraLax she will have a BM every other day.  Stools still small, incomplete evacuation.  Drinks plenty of water daily.  Straining is the norm.  Usually on the toilet for 5-10 minutes.  Rare diarrhea.  Grade III hemorrhoids          Past Medical History:  Diagnosis Date   Anemia    Angio-edema    Anxiety    Bacterial vaginosis    Low back pain    Mild persistent asthma 07/28/2021   Ovarian cyst    Peripheral neuropathy    Urticaria    Vitamin D deficiency      Past Surgical History:  Procedure Laterality Date   CESAREAN SECTION  04/30/2013   x 2   MULTIPLE TOOTH EXTRACTIONS     SALPINGECTOMY Right    for ectopic pregnancy   Family History  Problem Relation Age of Onset   Hypertension Mother    Thyroid disease Mother    HIV Father    Cirrhosis Father    Cancer Maternal Grandmother        Breast   Asthma Maternal Grandmother    Hypertension Maternal Grandfather    Heart disease Maternal Grandfather    Diabetes Maternal Grandfather    Hypertension Paternal Grandmother    COPD Paternal Grandmother    Heart disease Paternal Grandmother    Hyperlipidemia Paternal Grandmother    Stroke Paternal Grandmother    Diabetes Paternal Grandmother    Hepatitis C Paternal Grandmother    Rheum arthritis Paternal Grandmother    Social History   Tobacco Use   Smoking status: Never   Smokeless tobacco: Never  Vaping Use   Vaping Use: Never used  Substance Use Topics   Alcohol use: Yes     Alcohol/week: 0.0 standard drinks of alcohol    Comment: Rare - social   Drug use: No   Current Outpatient Medications  Medication Sig Dispense Refill   albuterol (VENTOLIN HFA) 108 (90 Base) MCG/ACT inhaler Inhale 1-2 puffs into the lungs every 6 (six) hours as needed for wheezing or shortness of breath. 18 g 0   benzonatate (TESSALON) 100 MG capsule Take by mouth.     dexamethasone (DECADRON) 6 MG tablet Take by mouth.     polyethylene glycol (MIRALAX / GLYCOLAX) 17 g packet Take 17 g by mouth daily.     etodolac (LODINE) 200 MG capsule Take 1 capsule (200 mg total) by mouth every 8 (eight) hours. 15 capsule 0   polycarbophil (FIBERCON) 625 MG tablet Take 625 mg by mouth daily. (Patient not taking: Reported on 11/28/2022)     No current facility-administered medications for this visit.   Allergies  Allergen Reactions   Mangifera Indica Swelling   Mango Flavor Swelling   Pineapple Swelling   Other Hives, Itching and Rash    Goli Nutrition supplement     Review of Systems: All systems reviewed and negative except where noted in HPI.    No results found.  Physical Exam: Ht 5\' 5"  (1.651 m)   Wt 176 lb (79.8 kg)   BMI 29.29 kg/m  Constitutional: Pleasant,well-developed, ***female in no acute distress. HEENT: Normocephalic and atraumatic. Conjunctivae are normal. No scleral icterus. Neck supple.  Cardiovascular: Normal rate, regular rhythm.  Pulmonary/chest: Effort normal and breath sounds normal. No wheezing, rales or rhonchi. Abdominal: Soft, nondistended, nontender. Bowel sounds active throughout. There are no masses palpable. No hepatomegaly. Extremities: no edema Lymphadenopathy: No cervical adenopathy noted. Neurological: Alert and oriented to person place and time. Skin: Skin is warm and dry. No rashes noted. Psychiatric: Normal mood and affect. Behavior is normal.  CBC    Component Value Date/Time   WBC 7.0 01/08/2020 1435   RBC 4.18 01/08/2020 1435   HGB 13.2  01/08/2020 1435   HCT 39.1 01/08/2020 1435   PLT 189.0 01/08/2020 1435   MCV 93.6 01/08/2020 1435   MCH 31.8 06/30/2015 0053   MCHC 33.7 01/08/2020 1435   RDW 12.4 01/08/2020 1435    CMP     Component Value Date/Time   NA 141 01/08/2020 1435   K 4.5 01/08/2020 1435   CL 103 01/08/2020 1435   CO2 30 01/08/2020 1435   GLUCOSE 95 01/08/2020 1435   BUN 11 01/08/2020 1435   CREATININE 0.78 01/08/2020 1435   CALCIUM 9.8 01/08/2020 1435   AST 14 01/08/2020 1435   ALT 10 01/08/2020 1435   GFRNONAA >60 06/30/2015 0053   GFRAA >60 06/30/2015 0053       Latest Ref Rng & Units 01/08/2020    2:35 PM 06/30/2015   12:53 AM 02/07/2015   12:20 PM  CBC EXTENDED  WBC 4.0 - 10.5 K/uL 7.0  9.8  6.1   RBC 3.87 - 5.11 Mil/uL 4.18  3.84  4.22   Hemoglobin 12.0 - 15.0 g/dL 16.1  09.6  04.5   HCT 36.0 - 46.0 % 39.1  34.9  38.6   Platelets 150.0 - 400.0 K/uL 189.0  197  188       ASSESSMENT AND PLAN:  IBS-C - Linzess 145 mcg PO daily - Pelvic floor PT - MiraLax purge prep  If Linzess cost prohibitive, will try daily MiraLax+ Metamucil plus PRN Fermin Schwab, MD

## 2022-12-06 ENCOUNTER — Other Ambulatory Visit: Payer: Self-pay

## 2022-12-06 ENCOUNTER — Ambulatory Visit: Payer: 59 | Attending: Gastroenterology | Admitting: Physical Therapy

## 2022-12-06 DIAGNOSIS — M6281 Muscle weakness (generalized): Secondary | ICD-10-CM | POA: Diagnosis not present

## 2022-12-06 DIAGNOSIS — R293 Abnormal posture: Secondary | ICD-10-CM | POA: Insufficient documentation

## 2022-12-06 DIAGNOSIS — R279 Unspecified lack of coordination: Secondary | ICD-10-CM | POA: Insufficient documentation

## 2022-12-06 NOTE — Patient Instructions (Signed)
Types of Fiber  There are two main types of fiber:  insoluble and soluble.  Both of these types can prevent and relieve constipation and diarrhea, although some people find one or the other to be more easily digested.  This handout details information about both types of fiber. recommended 25-35 grams of fiber per day,  average 9-12 grams per meal   key is a balance between soluble and insoluble  Insoluble Fiber        Functions of Insoluble Fiber moves bulk through the intestines  controls and balances the pH (acidity) in the intestines   This type of fiber should be avoided or reduced if you have soft, frequent bowel movements or leakage      Benefits of Insoluble Fiber promotes regular bowel movement and prevents constipation  removes fecal waste through colon in less time  keeps an optimal pH in intestines to prevent microbes from producing cancer substances, therefore preventing colon cancer        Food Sources of Insoluble Fiber whole-wheat products  wheat bran "miller's bran" corn bran  flax seed or other seeds vegetables such as green beans, broccoli, cauliflower and potato skins  fruit skins and root vegetable skins  popcorn brown rice  Soluble Fiber( Types 5,6,7)       Functions of Soluble Fiber  holds water in the colon to bulk and soften the stool prolongs stomach emptying time so that sugar is released and absorbed more slowly  prevent leakage associated with soft, frequent bowel movements.        Benefits of Soluble Fiber lowers total cholesterol and LDL cholesterol (the bad cholesterol) therefore reducing the risk of heart disease  regulates blood sugar for people with diabetes       Food Sources of Soluble Fiber oat/oat bran dried beans and peas  nuts  barley  flax seed or other seeds fruits such as oranges, pears, peaches, and apples  vegetables such as carrots  psyllium husk  prunes    BALLOON breathing: instead of pushing/straining make open fist  with hand blow throughout like blowing up a balloon.

## 2022-12-06 NOTE — Therapy (Signed)
OUTPATIENT PHYSICAL THERAPY FEMALE PELVIC EVALUATION   Patient Name: Debra Pruitt MRN: [id number] DOB:[birth year], 32 y.o., female Today's Date: 12/06/2022  END OF SESSION:  PT End of Session - 12/06/22 1618     Visit Number 1    Date for PT Re-Evaluation 03/08/23    Authorization Type [employer]    PT Start Time 1615    PT Stop Time 1655    PT Time Calculation (min) 40 min    Activity Tolerance Patient tolerated treatment well    Behavior During Therapy [facility] for tasks assessed/performed             Past Medical History:  Diagnosis Date   Anemia    Angio-edema    Anxiety    Bacterial vaginosis    Low back pain    Mild persistent asthma 07/28/2021   Ovarian cyst    Peripheral neuropathy    Urticaria    Vitamin D deficiency    Past Surgical History:  Procedure Laterality Date   CESAREAN SECTION  04/30/2013   x 2   MULTIPLE TOOTH EXTRACTIONS     SALPINGECTOMY Right    for ectopic pregnancy   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   Melanocytic nevus of left lower extremity 08/30/2021   Localization-related idiopathic epilepsy and epileptic syndromes with seizures of localized onset, not intractable, without status epilepticus (HCC) 07/28/2021   Migraine headache without aura 07/28/2021   Mild persistent asthma 07/28/2021   Food intolerance 03/10/2021   Cervical radiculopathy 05/31/2020   Vitamin D deficiency 01/09/2020    PCP: Herbie Drape, MD  REFERRING PROVIDER: Jenel Lucks, MD  REFERRING DIAG: K58.1 (ICD-10-CM) - Irritable bowel syndrome with constipation  THERAPY DIAG:  Muscle weakness (generalized)  Abnormal posture  Unspecified lack of coordination  Rationale for Evaluation and Treatment: Rehabilitation  ONSET DATE: life long constipation   SUBJECTIVE:                                                                                                                                                                                            SUBJECTIVE STATEMENT: Chronic constipation and straining, feels like I'm not able to empty. Has been taking miralax, has gone twice. Prior to that once per week or a little longer. Been worse since having second baby (6yo)  Fluid intake: Yes: water - 40-80oz; tea 32oz at most    PAIN:  Are you having pain? Yes NPRS scale: 10/10 - at its worst but usually just comfortable 2/10 Pain location:  rectally and abdomen  Pain type: achy, crampy Pain description: intermittent   Aggravating factors: being constipated, straining Relieving factors: emptying   PRECAUTIONS: None  RED FLAGS: Bowel or bladder incontinence: Yes: see below.     WEIGHT BEARING RESTRICTIONS: No  FALLS:  Has patient fallen in last 6 months? No  LIVING ENVIRONMENT: Lives with: lives with their family Lives in: House/apartment  OCCUPATION: day Tree surgeon  PLOF: Independent  PATIENT GOALS: to be more regular   PERTINENT HISTORY:  Hemorrhoids,  Bacterial vaginosis, Ovarian cyst, CESAREAN SECTION,   SALPINGECTOMY  Sexual abuse: No  BOWEL MOVEMENT: Pain with bowel movement: Yes Type of bowel movement:Type (Bristol Stool Scale) 1, Frequency now 2x this week, but prior 1x per week, and Strain Yes Fully empty rectum: Yes:   Leakage: Yes: sometimes has post emptying leakage  Pads: Yes: sometimes Fiber supplement: No - has tried metamucil but not currently   URINATION: Pain with urination: No Fully empty bladder: Yes:   Stream: Strong and Weak Urgency: Yes:   Frequency: sometimes more than every 2 hours pending amount of fluids, 5-6x per night pending fluids Leakage:  holding it too long Pads: Yes: sometimes  INTERCOURSE: Pain with intercourse: Initial Penetration, During Penetration, and After Intercourse, dryness Ability to have vaginal penetration:  Yes:   Climax: not painful   Marinoff Scale: 2/3  PREGNANCY: Vaginal deliveries 0 Tearing No C-section deliveries 2 Currently pregnant No  PROLAPSE: None   OBJECTIVE:   DIAGNOSTIC FINDINGS:    COGNITION: Overall cognitive status: Within functional limits for tasks assessed     SENSATION: Light touch: Appears intact Proprioception: Appears intact  MUSCLE LENGTH: Bil hamstrings and adductors limited by 25%    POSTURE: rounded shoulders, forward head, and posterior pelvic tilt   LUMBARAROM/PROM:  A/PROM A/PROM  eval  Flexion WFL  Extension WFL  Right lateral flexion WFL  Left lateral flexion WFL  Right rotation Limited by 25%  Left rotation Limited by 25%   (Blank rows = not tested)  LOWER EXTREMITY ROM:  WFL  LOWER EXTREMITY MMT:  Bil hips grossly 4/5 with exception of Rt abduction 3/5, Lt abduction 3+/5; knees 5/5  PALPATION:   General  TTP at Rt flank and Rt low quadrant of abdomen, moderate-profound fascial restrictions throughout all abdominal quadrants                External Perineal Exam pt deferred                             Internal Pelvic Floor pt deferred  Patient confirms identification and approves PT to assess internal pelvic floor and treatment No  PELVIC MMT:   MMT eval  Vaginal   Internal Anal Sphincter   External Anal Sphincter   Puborectalis   Diastasis Recti   (Blank rows = not tested)        TONE: pt deferred  PROLAPSE: pt deferred  TODAY'S TREATMENT:  DATE:   12/06/22 EVAL Examination completed, findings reviewed, pt educated on POC, abdominal massage, voiding mechanics, fiber types. Pt motivated to participate in PT and agreeable to attempt recommendations.     PATIENT EDUCATION:  Education details: abdominal massage, voiding mechanics, fiber types Person educated: Patient Education method: Explanation,  Demonstration, Tactile cues, Verbal cues, and Handouts Education comprehension: verbalized understanding and returned demonstration  HOME EXERCISE PROGRAM: abdominal massage, voiding mechanics, fiber types  ASSESSMENT:  CLINICAL IMPRESSION: Patient is a 32 y.o. female  who was seen today for physical therapy evaluation and treatment for chronic constipation. Pt found to have decreased flexibility at spine and hips, decreased core and hip strength, fascial restrictions throughout abdomen with TTP at Rt side. Pt deferred internal assessment today but agreeable to next session with more time to prepare for this. Pt given education for starting abdominal massage, breathing mechanics, and fiber types. Pt denied questions. Pt would benefit from additional PT to further address deficits.    OBJECTIVE IMPAIRMENTS: decreased coordination, decreased endurance, decreased strength, increased fascial restrictions, increased muscle spasms, impaired flexibility, improper body mechanics, postural dysfunction, and pain.   ACTIVITY LIMITATIONS: continence  PARTICIPATION LIMITATIONS: community activity  PERSONAL FACTORS: Time since onset of injury/illness/exacerbation are also affecting patient's functional outcome.   REHAB POTENTIAL: Good  CLINICAL DECISION MAKING: Stable/uncomplicated  EVALUATION COMPLEXITY: Low   GOALS: Goals reviewed with patient? Yes  SHORT TERM GOALS: Target date: 01/03/23  Pt to be I with HEP.  Baseline: Goal status: INITIAL  2.  Pt to be I with breathing mechanics and pelvic floor coordination to decreased strain and tension at pelvic floor for improved bowel mechanics. .  Baseline:  Goal status: INITIAL   LONG TERM GOALS: Target date: 03/08/23  Pt to be I with advanced HEP.  Baseline:  Goal status: INITIAL  2.  Pt will report her BMs are complete due to improved bowel habits and evacuation techniques.  Baseline:  Goal status: INITIAL  3.  Pt will report 3 BMs  per week due to improved muscle tone and coordination with BMs.  Baseline:  Goal status: INITIAL  4.  Pt to demonstrate improved coordination of pelvic floor and breathing mechanics with 10# squat with appropriate synergistic patterns to decrease pain and leakage at least 75% of the time.    Baseline:  Goal status: INITIAL  5.  Pt will report no more than 2/10 pain with bowel movements due to improvements in posture, strength, and muscle length  Baseline:  Goal status: INITIAL  6.  Pt to report no more than one instance of leakage in a week to decreased symptoms.  Baseline:  Goal status: INITIAL  PLAN:  PT FREQUENCY: 1x/week  PT DURATION:  8 sessions  PLANNED INTERVENTIONS: Therapeutic exercises, Therapeutic activity, Neuromuscular re-education, Patient/Family education, Self Care, Joint mobilization, DME instructions, Aquatic Therapy, Dry Needling, Spinal mobilization, Cryotherapy, Moist heat, scar mobilization, Taping, Traction, Biofeedback, Manual therapy, and Re-evaluation  PLAN FOR NEXT SESSION: internal assessment/treatment, manual spine/hips/core, breathing mechanics ,relaxation techniques, core and hip strengthening,   Otelia Sergeant, PT, DPT 07/10/245:12 PM

## 2022-12-12 ENCOUNTER — Telehealth: Payer: Self-pay

## 2022-12-12 ENCOUNTER — Other Ambulatory Visit (HOSPITAL_COMMUNITY): Payer: Self-pay

## 2022-12-12 NOTE — Telephone Encounter (Signed)
*  Gastro  PA request received for Linzess capsules  PA submitted to [employer] via CMM and is pending additional questions/determination  Key: [id number]

## 2022-12-19 ENCOUNTER — Ambulatory Visit: Payer: 59 | Admitting: Physical Therapy

## 2022-12-19 ENCOUNTER — Other Ambulatory Visit (HOSPITAL_COMMUNITY): Payer: Self-pay

## 2022-12-19 NOTE — Telephone Encounter (Signed)
Pharmacy Patient Advocate Encounter  Received notification from [facility] that Prior Authorization for Linzess capsules has been APPROVED from 12-15-2022 to 12-15-2023. Ran test claim, Copay is $n/a Filled 11-28-2022 Next available 12-22-2022. Maximum 30 day supply per insurance.  PA #/Case ID/Reference #: [id number]

## 2022-12-27 ENCOUNTER — Ambulatory Visit: Payer: 59 | Admitting: Physical Therapy

## 2022-12-28 DIAGNOSIS — L638 Other alopecia areata: Secondary | ICD-10-CM | POA: Diagnosis not present

## 2022-12-28 DIAGNOSIS — D2272 Melanocytic nevi of left lower limb, including hip: Secondary | ICD-10-CM | POA: Diagnosis not present

## 2023-01-09 ENCOUNTER — Encounter: Payer: Self-pay | Admitting: Gastroenterology

## 2023-01-10 ENCOUNTER — Ambulatory Visit: Payer: 59 | Admitting: Physical Therapy

## 2023-01-11 ENCOUNTER — Other Ambulatory Visit: Payer: Self-pay

## 2023-01-11 DIAGNOSIS — R109 Unspecified abdominal pain: Secondary | ICD-10-CM

## 2023-01-17 ENCOUNTER — Ambulatory Visit: Payer: 59 | Attending: Gastroenterology | Admitting: Physical Therapy

## 2023-01-26 ENCOUNTER — Ambulatory Visit: Payer: 59 | Admitting: Physical Therapy

## 2023-01-30 ENCOUNTER — Encounter: Payer: 59 | Admitting: Physical Therapy

## 2023-02-05 ENCOUNTER — Ambulatory Visit: Payer: 59 | Attending: Gastroenterology | Admitting: Physical Therapy

## 2023-02-05 DIAGNOSIS — R293 Abnormal posture: Secondary | ICD-10-CM | POA: Insufficient documentation

## 2023-02-05 DIAGNOSIS — R279 Unspecified lack of coordination: Secondary | ICD-10-CM | POA: Insufficient documentation

## 2023-02-05 DIAGNOSIS — M6281 Muscle weakness (generalized): Secondary | ICD-10-CM | POA: Diagnosis not present

## 2023-02-05 NOTE — Patient Instructions (Signed)
Types of Fiber  There are two main types of fiber:  insoluble and soluble.  Both of these types can prevent and relieve constipation and diarrhea, although some people find one or the other to be more easily digested.  This handout details information about both types of fiber. recommended 25-35 grams of fiber per day,  average 9-12 grams per meal   key is a balance between soluble and insoluble  Insoluble Fiber        Functions of Insoluble Fiber moves bulk through the intestines  controls and balances the pH (acidity) in the intestines   This type of fiber should be avoided or reduced if you have soft, frequent bowel movements or leakage      Benefits of Insoluble Fiber promotes regular bowel movement and prevents constipation  removes fecal waste through colon in less time  keeps an optimal pH in intestines to prevent microbes from producing cancer substances, therefore preventing colon cancer        Food Sources of Insoluble Fiber whole-wheat products  wheat bran "miller's bran" corn bran  flax seed or other seeds vegetables such as green beans, broccoli, cauliflower and potato skins  fruit skins and root vegetable skins  popcorn brown rice  Soluble Fiber( Types 5,6,7)       Functions of Soluble Fiber  holds water in the colon to bulk and soften the stool prolongs stomach emptying time so that sugar is released and absorbed more slowly  prevent leakage associated with soft, frequent bowel movements.        Benefits of Soluble Fiber lowers total cholesterol and LDL cholesterol (the bad cholesterol) therefore reducing the risk of heart disease  regulates blood sugar for people with diabetes       Food Sources of Soluble Fiber oat/oat bran dried beans and peas  nuts  barley  flax seed or other seeds fruits such as oranges, pears, peaches, and apples  vegetables such as carrots  psyllium husk  prunes  

## 2023-02-05 NOTE — Therapy (Signed)
OUTPATIENT PHYSICAL THERAPY FEMALE PELVIC TREATMENT   Patient Name: Keaton Mihalovich MRN: [id number] DOB:1990/08/08, 32 y.o., female Today's Date: 02/05/2023  END OF SESSION:  PT End of Session - 02/05/23 0805     Visit Number 2    Date for PT Re-Evaluation 03/08/23    Authorization Type [employer]    PT Start Time 0802    PT Stop Time 0841    PT Time Calculation (min) 39 min    Activity Tolerance Patient tolerated treatment well    Behavior During Therapy [facility] for tasks assessed/performed             Past Medical History:  Diagnosis Date   Anemia    Angio-edema    Anxiety    Bacterial vaginosis    Low back pain    Mild persistent asthma 07/28/2021   Ovarian cyst    Peripheral neuropathy    Urticaria    Vitamin D deficiency    Past Surgical History:  Procedure Laterality Date   CESAREAN SECTION  04/30/2013   x 2   MULTIPLE TOOTH EXTRACTIONS     SALPINGECTOMY Right    for ectopic pregnancy   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   Melanocytic nevus of left lower extremity 08/30/2021   Localization-related idiopathic epilepsy and epileptic syndromes with seizures of localized onset, not intractable, without status epilepticus (HCC) 07/28/2021   Migraine headache without aura 07/28/2021   Mild persistent asthma 07/28/2021   Food intolerance 03/10/2021   Cervical radiculopathy 05/31/2020   Vitamin D deficiency 01/09/2020    PCP: Herbie Drape, MD  REFERRING PROVIDER: Jenel Lucks, MD  REFERRING DIAG: K58.1 (ICD-10-CM) - Irritable bowel syndrome with constipation  THERAPY DIAG:  Muscle weakness (generalized)  Unspecified lack of coordination  Abnormal posture  Rationale for Evaluation and Treatment: Rehabilitation  ONSET DATE: life long constipation   SUBJECTIVE:                                                                                                                                                                                            SUBJECTIVE STATEMENT: Has started linzess and sometimes needs to take two to have a bowel movement, has been doing massage and this has helping. Having 2-3 bowel movement per week now. Linzess kind of makes stool a little looser.   Fluid intake: Yes: water - 40-80oz; tea 32oz at most    PAIN:  Are you having pain? No may a little cramp with feeling need to have bowel movement  NPRS scale:  Pain location:  rectally and abdomen  Pain type: achy, crampy Pain description: intermittent   Aggravating factors: being constipated, straining Relieving factors: emptying   PRECAUTIONS: None  RED FLAGS: Bowel or bladder incontinence: Yes: see below.     WEIGHT BEARING RESTRICTIONS: No  FALLS:  Has patient fallen in last 6 months? No  LIVING ENVIRONMENT: Lives with: lives with their family Lives in: House/apartment  OCCUPATION: day Tree surgeon  PLOF: Independent  PATIENT GOALS: to be more regular   PERTINENT HISTORY:  Hemorrhoids,  Bacterial vaginosis, Ovarian cyst, CESAREAN SECTION,   SALPINGECTOMY  Sexual abuse: No  BOWEL MOVEMENT: Pain with bowel movement: Yes Type of bowel movement:Type (Bristol Stool Scale) 1, Frequency now 2x this week, but prior 1x per week, and Strain Yes Fully empty rectum: Yes:   Leakage: Yes: sometimes has post emptying leakage  Pads: Yes: sometimes Fiber supplement: No - has tried metamucil but not currently   URINATION: Pain with urination: No Fully empty bladder: Yes:   Stream: Strong and Weak Urgency: Yes:   Frequency: sometimes more than every 2 hours pending amount of fluids, 5-6x per night pending fluids Leakage:  holding it too long Pads: Yes: sometimes  INTERCOURSE: Pain with intercourse: Initial Penetration, During Penetration, and After Intercourse, dryness Ability to have vaginal penetration:  Yes:   Climax: not  painful  Marinoff Scale: 2/3  PREGNANCY: Vaginal deliveries 0 Tearing No C-section deliveries 2 Currently pregnant No  PROLAPSE: None   OBJECTIVE:   DIAGNOSTIC FINDINGS:    COGNITION: Overall cognitive status: Within functional limits for tasks assessed     SENSATION: Light touch: Appears intact Proprioception: Appears intact  MUSCLE LENGTH: Bil hamstrings and adductors limited by 25%    POSTURE: rounded shoulders, forward head, and posterior pelvic tilt   LUMBARAROM/PROM:  A/PROM A/PROM  eval  Flexion WFL  Extension WFL  Right lateral flexion WFL  Left lateral flexion WFL  Right rotation Limited by 25%  Left rotation Limited by 25%   (Blank rows = not tested)  LOWER EXTREMITY ROM:  WFL  LOWER EXTREMITY MMT:  Bil hips grossly 4/5 with exception of Rt abduction 3/5, Lt abduction 3+/5; knees 5/5  PALPATION:   General  TTP at Rt flank and Rt low quadrant of abdomen, moderate-profound fascial restrictions throughout all abdominal quadrants                External Perineal Exam WFL no pain                             Internal Pelvic Floor mild tension and small trigger points at Rt pubococcygeus.   Patient confirms identification and approves PT to assess internal pelvic floor and treatment No 02/05/23 yes  No emotional/communication barriers or cognitive limitation. Patient is motivated to learn. Patient understands and agrees with treatment goals and plan. PT explains patient will be examined in standing, sitting, and lying down to see how their muscles and joints work. When they are ready, they will be asked to remove their underwear so PT can examine their perineum. The patient is also given the option of providing their own chaperone as one is not provided in our facility. The patient also has the right and is explained the right to defer or refuse any part of the evaluation or treatment including the internal exam. With the patient's consent, PT will use  one gloved finger to gently assess the  muscles of the pelvic floor, seeing how well it contracts and relaxes and if there is muscle symmetry. After, the patient will get dressed and PT and patient will discuss exam findings and plan of care. PT and patient discuss plan of care, schedule, attendance policy and HEP activities.  PELVIC MMT:   MMT eval  Vaginal 4/5; 9s; 7 reps  Internal Anal Sphincter   External Anal Sphincter   Puborectalis   Diastasis Recti   (Blank rows = not tested)        TONE: WFL  PROLAPSE: Needs to be further assessed, but not seen in hooklying with contraction or at rest   TODAY'S TREATMENT:                                                                                                                              DATE:   02/05/23:  Patient consented to internal pelvic floor assessment vaginally this date and found to have mild tension and small trigger points at Rt pubococcygeus. Manual completed here with improvement noted and pt denied pain throughout. With gentle stretching and trigger point release all tension released well. Pt did report a little cramping once getting dressed after treatment but denied pain and resolved quickly. Pt educated on findings, shown on female pelvic floor model, and given HEP and reviewed with pt. Pt denied questions.     PATIENT EDUCATION:  Education details: abdominal massage, voiding mechanics, fiber types Person educated: Patient Education method: Explanation, Demonstration, Tactile cues, Verbal cues, and Handouts Education comprehension: verbalized understanding and returned demonstration  HOME EXERCISE PROGRAM: abdominal massage, voiding mechanics, fiber types, [id number]  ASSESSMENT:  CLINICAL IMPRESSION: Patient presents for treatment, reports slight improvement with bowel movements and habits. Educated on importance of being consistent with abdominal massage and attempting to use step stool and balloon breathing more  consistently as well. Pt consented to internal vaginal evaluation today of pelvic floor and findings above. Pt tolerated well and denied questions reports now that she has less travel will be able to do HEP and complete voiding recommendations. Pt would benefit from additional PT to further address deficits.    OBJECTIVE IMPAIRMENTS: decreased coordination, decreased endurance, decreased strength, increased fascial restrictions, increased muscle spasms, impaired flexibility, improper body mechanics, postural dysfunction, and pain.   ACTIVITY LIMITATIONS: continence  PARTICIPATION LIMITATIONS: community activity  PERSONAL FACTORS: Time since onset of injury/illness/exacerbation are also affecting patient's functional outcome.   REHAB POTENTIAL: Good  CLINICAL DECISION MAKING: Stable/uncomplicated  EVALUATION COMPLEXITY: Low   GOALS: Goals reviewed with patient? Yes  SHORT TERM GOALS: Target date: 01/03/23  Pt to be I with HEP.  Baseline: Goal status: INITIAL  2.  Pt to be I with breathing mechanics and pelvic floor coordination to decreased strain and tension at pelvic floor for improved bowel mechanics. .  Baseline:  Goal status: INITIAL   LONG TERM GOALS: Target date: 03/08/23  Pt to be I with advanced HEP.  Baseline:  Goal status: INITIAL  2.  Pt will report her BMs are complete due to improved bowel habits and evacuation techniques.  Baseline:  Goal status: INITIAL  3.  Pt will report 3 BMs per week due to improved muscle tone and coordination with BMs.  Baseline:  Goal status: INITIAL  4.  Pt to demonstrate improved coordination of pelvic floor and breathing mechanics with 10# squat with appropriate synergistic patterns to decrease pain and leakage at least 75% of the time.    Baseline:  Goal status: INITIAL  5.  Pt will report no more than 2/10 pain with bowel movements due to improvements in posture, strength, and muscle length  Baseline:  Goal status:  INITIAL  6.  Pt to report no more than one instance of leakage in a week to decreased symptoms.  Baseline:  Goal status: INITIAL  PLAN:  PT FREQUENCY: 1x/week  PT DURATION:  8 sessions  PLANNED INTERVENTIONS: Therapeutic exercises, Therapeutic activity, Neuromuscular re-education, Patient/Family education, Self Care, Joint mobilization, DME instructions, Aquatic Therapy, Dry Needling, Spinal mobilization, Cryotherapy, Moist heat, scar mobilization, Taping, Traction, Biofeedback, Manual therapy, and Re-evaluation  PLAN FOR NEXT SESSION: internal assessment/treatment, manual spine/hips/core, breathing mechanics ,relaxation techniques, core and hip strengthening,   Otelia Sergeant, PT, DPT 09/09/248:43 AM

## 2023-02-13 ENCOUNTER — Ambulatory Visit: Payer: 59 | Admitting: Physical Therapy

## 2023-02-20 ENCOUNTER — Ambulatory Visit: Payer: 59 | Admitting: Physical Therapy

## 2023-02-20 DIAGNOSIS — R293 Abnormal posture: Secondary | ICD-10-CM | POA: Diagnosis not present

## 2023-02-20 DIAGNOSIS — M6281 Muscle weakness (generalized): Secondary | ICD-10-CM

## 2023-02-20 DIAGNOSIS — R279 Unspecified lack of coordination: Secondary | ICD-10-CM | POA: Diagnosis not present

## 2023-02-20 NOTE — Therapy (Signed)
OUTPATIENT PHYSICAL THERAPY FEMALE PELVIC TREATMENT   Patient Name: Debra Pruitt MRN: [id number] DOB:06/06/1990, 32 y.o., female Today's Date: 02/20/2023  END OF SESSION:  PT End of Session - 02/20/23 0804     Visit Number 3    Date for PT Re-Evaluation 03/08/23    Authorization Type Aet[employer]    PT Start Time 0804   arrival time   PT Stop Time 0842    PT Time Calculation (min) 38 min    Activity Tolerance Patient tolerated treatment well    Behavior During Therapy [facility] for tasks assessed/performed             Past Medical History:  Diagnosis Date   Anemia    Angio-edema    Anxiety    Bacterial vaginosis    Low back pain    Mild persistent asthma 07/28/2021   Ovarian cyst    Peripheral neuropathy    Urticaria    Vitamin D deficiency    Past Surgical History:  Procedure Laterality Date   CESAREAN SECTION  04/30/2013   x 2   MULTIPLE TOOTH EXTRACTIONS     SALPINGECTOMY Right    for ectopic pregnancy   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   Melanocytic nevus of left lower extremity 08/30/2021   Localization-related idiopathic epilepsy and epileptic syndromes with seizures of localized onset, not intractable, without status epilepticus (HCC) 07/28/2021   Migraine headache without aura 07/28/2021   Mild persistent asthma 07/28/2021   Food intolerance 03/10/2021   Cervical radiculopathy 05/31/2020   Vitamin D deficiency 01/09/2020    PCP: Herbie Drape, MD  REFERRING PROVIDER: Jenel Lucks, MD  REFERRING DIAG: K58.1 (ICD-10-CM) - Irritable bowel syndrome with constipation  THERAPY DIAG:  Muscle weakness (generalized)  Abnormal posture  Unspecified lack of coordination  Rationale for Evaluation and Treatment: Rehabilitation  ONSET DATE: life long constipation   SUBJECTIVE:                                                                                                                                                                                            SUBJECTIVE STATEMENT: Has started linzess and sometimes needs to take two to have a bowel movement, has been doing massage and this has helping. Having 2-3 bowel movement per week now. Linzess kind of makes stool a little looser.   Fluid intake: Yes: water - 40-80oz; tea 32oz at most    PAIN:  Are you having pain? No may a little cramp with feeling need to have  bowel movement  NPRS scale:  Pain location:  rectally and abdomen  Pain type: achy, crampy Pain description: intermittent   Aggravating factors: being constipated, straining Relieving factors: emptying   PRECAUTIONS: None  RED FLAGS: Bowel or bladder incontinence: Yes: see below.     WEIGHT BEARING RESTRICTIONS: No  FALLS:  Has patient fallen in last 6 months? No  LIVING ENVIRONMENT: Lives with: lives with their family Lives in: House/apartment  OCCUPATION: day Tree surgeon  PLOF: Independent  PATIENT GOALS: to be more regular   PERTINENT HISTORY:  Hemorrhoids,  Bacterial vaginosis, Ovarian cyst, CESAREAN SECTION,   SALPINGECTOMY  Sexual abuse: No  BOWEL MOVEMENT: Pain with bowel movement: Yes Type of bowel movement:Type (Bristol Stool Scale) 1, Frequency now 2x this week, but prior 1x per week, and Strain Yes Fully empty rectum: Yes:   Leakage: Yes: sometimes has post emptying leakage  Pads: Yes: sometimes Fiber supplement: No - has tried metamucil but not currently   URINATION: Pain with urination: No Fully empty bladder: Yes:   Stream: Strong and Weak Urgency: Yes:   Frequency: sometimes more than every 2 hours pending amount of fluids, 5-6x per night pending fluids Leakage:  holding it too long Pads: Yes: sometimes  INTERCOURSE: Pain with intercourse: Initial Penetration, During Penetration, and After Intercourse, dryness Ability to have vaginal penetration:  Yes:    Climax: not painful  Marinoff Scale: 2/3  PREGNANCY: Vaginal deliveries 0 Tearing No C-section deliveries 2 Currently pregnant No  PROLAPSE: None   OBJECTIVE:   DIAGNOSTIC FINDINGS:    COGNITION: Overall cognitive status: Within functional limits for tasks assessed     SENSATION: Light touch: Appears intact Proprioception: Appears intact  MUSCLE LENGTH: Bil hamstrings and adductors limited by 25%    POSTURE: rounded shoulders, forward head, and posterior pelvic tilt   LUMBARAROM/PROM:  A/PROM A/PROM  eval  Flexion WFL  Extension WFL  Right lateral flexion WFL  Left lateral flexion WFL  Right rotation Limited by 25%  Left rotation Limited by 25%   (Blank rows = not tested)  LOWER EXTREMITY ROM:  WFL  LOWER EXTREMITY MMT:  Bil hips grossly 4/5 with exception of Rt abduction 3/5, Lt abduction 3+/5; knees 5/5  PALPATION:   General  TTP at Rt flank and Rt low quadrant of abdomen, moderate-profound fascial restrictions throughout all abdominal quadrants                External Perineal Exam WFL no pain                             Internal Pelvic Floor mild tension and small trigger points at Rt pubococcygeus.   Patient confirms identification and approves PT to assess internal pelvic floor and treatment No 02/05/23 yes  No emotional/communication barriers or cognitive limitation. Patient is motivated to learn. Patient understands and agrees with treatment goals and plan. PT explains patient will be examined in standing, sitting, and lying down to see how their muscles and joints work. When they are ready, they will be asked to remove their underwear so PT can examine their perineum. The patient is also given the option of providing their own chaperone as one is not provided in our facility. The patient also has the right and is explained the right to defer or refuse any part of the evaluation or treatment including the internal exam. With the patient's  consent, PT will use one gloved finger to  gently assess the muscles of the pelvic floor, seeing how well it contracts and relaxes and if there is muscle symmetry. After, the patient will get dressed and PT and patient will discuss exam findings and plan of care. PT and patient discuss plan of care, schedule, attendance policy and HEP activities.  PELVIC MMT:   MMT eval  Vaginal 4/5; 9s; 7 reps  Internal Anal Sphincter   External Anal Sphincter   Puborectalis   Diastasis Recti   (Blank rows = not tested)        TONE: WFL  PROLAPSE: Needs to be further assessed, but not seen in hooklying with contraction or at rest   TODAY'S TREATMENT:                                                                                                                              DATE:   02/05/23:  Patient consented to internal pelvic floor assessment vaginally this date and found to have mild tension and small trigger points at Rt pubococcygeus. Manual completed here with improvement noted and pt denied pain throughout. With gentle stretching and trigger point release all tension released well. Pt did report a little cramping once getting dressed after treatment but denied pain and resolved quickly. Pt educated on findings, shown on female pelvic floor model, and given HEP and reviewed with pt. Pt denied questions.     02/20/23 Pt declined rectal assessment of pelvic floor until next appt until finished with cycle Pt had questions about supplements for fiber or adding a different/new medication for bowel movement regularity and encouraged to discuss this with medical provider as PT unable to make recommendations on this.  Pelvic tilts on ball 2x10 ant/post Pelvic tilt rt/lt on ball 2x10  Pelvic circles x20 each way on ball Quad hip shifts x15 each on yoga block Deep squat stretch on yoga block full height 3x30s   PATIENT EDUCATION:  Education details: abdominal massage, voiding mechanics, fiber  types Person educated: Patient Education method: Explanation, Demonstration, Tactile cues, Verbal cues, and Handouts Education comprehension: verbalized understanding and returned demonstration  HOME EXERCISE PROGRAM: abdominal massage, voiding mechanics, fiber types, [id number]  ASSESSMENT:  CLINICAL IMPRESSION: Patient presents for treatment, pt reports she hasn't been doing a lot of fiber or water intake this past week and has noticed bowel movements a little slower despite linzess and had 1-2 bowel movements over the week. Pt has been using step stool and abdominal massage and this is helping but does note the difference in food and water intake. Did have questions about adding laxative or fiber and PT recommended/encouraged discussing this with medical provider as PT cannot recommendation medication/supplements. Pt agreed. Pt tolerated session well. Pt would benefit from additional PT to further address deficits.    OBJECTIVE IMPAIRMENTS: decreased coordination, decreased endurance, decreased strength, increased fascial restrictions, increased muscle spasms, impaired flexibility, improper body mechanics, postural dysfunction, and pain.   ACTIVITY LIMITATIONS: continence  PARTICIPATION  LIMITATIONS: community activity  PERSONAL FACTORS: Time since onset of injury/illness/exacerbation are also affecting patient's functional outcome.   REHAB POTENTIAL: Good  CLINICAL DECISION MAKING: Stable/uncomplicated  EVALUATION COMPLEXITY: Low   GOALS: Goals reviewed with patient? Yes  SHORT TERM GOALS: Target date: 01/03/23  Pt to be I with HEP.  Baseline: Goal status: MET  2.  Pt to be I with breathing mechanics and pelvic floor coordination to decreased strain and tension at pelvic floor for improved bowel mechanics. .  Baseline:  Goal status: MET   LONG TERM GOALS: Target date: 03/08/23  Pt to be I with advanced HEP.  Baseline:  Goal status: INITIAL  2.  Pt will report her BMs  are complete due to improved bowel habits and evacuation techniques.  Baseline:  Goal status: INITIAL  3.  Pt will report 3 BMs per week due to improved muscle tone and coordination with BMs.  Baseline:  Goal status: INITIAL  4.  Pt to demonstrate improved coordination of pelvic floor and breathing mechanics with 10# squat with appropriate synergistic patterns to decrease pain and leakage at least 75% of the time.    Baseline:  Goal status: INITIAL  5.  Pt will report no more than 2/10 pain with bowel movements due to improvements in posture, strength, and muscle length  Baseline:  Goal status: INITIAL  6.  Pt to report no more than one instance of leakage in a week to decreased symptoms.  Baseline:  Goal status: INITIAL  PLAN:  PT FREQUENCY: 1x/week  PT DURATION:  8 sessions  PLANNED INTERVENTIONS: Therapeutic exercises, Therapeutic activity, Neuromuscular re-education, Patient/Family education, Self Care, Joint mobilization, DME instructions, Aquatic Therapy, Dry Needling, Spinal mobilization, Cryotherapy, Moist heat, scar mobilization, Taping, Traction, Biofeedback, Manual therapy, and Re-evaluation  PLAN FOR NEXT SESSION: internal assessment/treatment, manual spine/hips/core, breathing mechanics ,relaxation techniques, core and hip strengthening,   Otelia Sergeant, PT, DPT 09/24/248:49 AM

## 2023-02-27 ENCOUNTER — Ambulatory Visit: Payer: 59 | Attending: Gastroenterology | Admitting: Physical Therapy

## 2023-02-27 DIAGNOSIS — R293 Abnormal posture: Secondary | ICD-10-CM | POA: Insufficient documentation

## 2023-02-27 DIAGNOSIS — M6281 Muscle weakness (generalized): Secondary | ICD-10-CM | POA: Insufficient documentation

## 2023-02-27 DIAGNOSIS — R279 Unspecified lack of coordination: Secondary | ICD-10-CM | POA: Insufficient documentation

## 2023-02-27 NOTE — Therapy (Signed)
OUTPATIENT PHYSICAL THERAPY FEMALE PELVIC TREATMENT   Patient Name: Heli Dino MRN: [id number] DOB:01/26/1991, 32 y.o., female Today's Date: 02/27/2023  END OF SESSION:  PT End of Session - 02/27/23 0807     Visit Number 4    Date for PT Re-Evaluation 03/08/23    Authorization Type [employer]    PT Start Time 0802    PT Stop Time 0843    PT Time Calculation (min) 41 min    Activity Tolerance Patient tolerated treatment well    Behavior During Therapy [facility] for tasks assessed/performed             Past Medical History:  Diagnosis Date   Anemia    Angio-edema    Anxiety    Bacterial vaginosis    Low back pain    Mild persistent asthma 07/28/2021   Ovarian cyst    Peripheral neuropathy    Urticaria    Vitamin D deficiency    Past Surgical History:  Procedure Laterality Date   CESAREAN SECTION  04/30/2013   x 2   MULTIPLE TOOTH EXTRACTIONS     SALPINGECTOMY Right    for ectopic pregnancy   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   Melanocytic nevus of left lower extremity 08/30/2021   Localization-related idiopathic epilepsy and epileptic syndromes with seizures of localized onset, not intractable, without status epilepticus (HCC) 07/28/2021   Migraine headache without aura 07/28/2021   Mild persistent asthma 07/28/2021   Food intolerance 03/10/2021   Cervical radiculopathy 05/31/2020   Vitamin D deficiency 01/09/2020    PCP: Herbie Drape, MD  REFERRING PROVIDER: Jenel Lucks, MD  REFERRING DIAG: K58.1 (ICD-10-CM) - Irritable bowel syndrome with constipation  THERAPY DIAG:  Muscle weakness (generalized)  Abnormal posture  Unspecified lack of coordination  Rationale for Evaluation and Treatment: Rehabilitation  ONSET DATE: life long constipation   SUBJECTIVE:                                                                                                                                                                                            SUBJECTIVE STATEMENT: Pt reports she has been having type 1 stools, has gone 4 times in the last 2 weeks. Not feeling done, not straining, step stool and abdominal massage.   Fluid intake: Yes: water - 40-80oz; tea 32oz at most    PAIN:  Are you having pain? No may a little cramp with feeling need to have bowel movement  NPRS scale:  Pain location:  rectally and abdomen  Pain type: achy, crampy Pain description: intermittent   Aggravating factors: being constipated, straining Relieving factors: emptying   PRECAUTIONS: None  RED FLAGS: Bowel or bladder incontinence: Yes: see below.     WEIGHT BEARING RESTRICTIONS: No  FALLS:  Has patient fallen in last 6 months? No  LIVING ENVIRONMENT: Lives with: lives with their family Lives in: House/apartment  OCCUPATION: day Tree surgeon  PLOF: Independent  PATIENT GOALS: to be more regular   PERTINENT HISTORY:  Hemorrhoids,  Bacterial vaginosis, Ovarian cyst, CESAREAN SECTION,   SALPINGECTOMY  Sexual abuse: No  BOWEL MOVEMENT: Pain with bowel movement: Yes Type of bowel movement:Type (Bristol Stool Scale) 1, Frequency now 2x this week, but prior 1x per week, and Strain Yes Fully empty rectum: Yes:   Leakage: Yes: sometimes has post emptying leakage  Pads: Yes: sometimes Fiber supplement: No - has tried metamucil but not currently   URINATION: Pain with urination: No Fully empty bladder: Yes:   Stream: Strong and Weak Urgency: Yes:   Frequency: sometimes more than every 2 hours pending amount of fluids, 5-6x per night pending fluids Leakage:  holding it too long Pads: Yes: sometimes  INTERCOURSE: Pain with intercourse: Initial Penetration, During Penetration, and After Intercourse, dryness Ability to have vaginal penetration:  Yes:   Climax: not painful  Marinoff Scale: 2/3  PREGNANCY: Vaginal  deliveries 0 Tearing No C-section deliveries 2 Currently pregnant No  PROLAPSE: None   OBJECTIVE:   DIAGNOSTIC FINDINGS:    COGNITION: Overall cognitive status: Within functional limits for tasks assessed     SENSATION: Light touch: Appears intact Proprioception: Appears intact  MUSCLE LENGTH: Bil hamstrings and adductors limited by 25%    POSTURE: rounded shoulders, forward head, and posterior pelvic tilt   LUMBARAROM/PROM:  A/PROM A/PROM  eval  Flexion WFL  Extension WFL  Right lateral flexion WFL  Left lateral flexion WFL  Right rotation Limited by 25%  Left rotation Limited by 25%   (Blank rows = not tested)  LOWER EXTREMITY ROM:  WFL  LOWER EXTREMITY MMT:  Bil hips grossly 4/5 with exception of Rt abduction 3/5, Lt abduction 3+/5; knees 5/5  PALPATION:   General  TTP at Rt flank and Rt low quadrant of abdomen, moderate-profound fascial restrictions throughout all abdominal quadrants                External Perineal Exam WFL no pain                             Internal Pelvic Floor mild tension and small trigger points at Rt pubococcygeus.   Patient confirms identification and approves PT to assess internal pelvic floor and treatment No 02/05/23 yes  No emotional/communication barriers or cognitive limitation. Patient is motivated to learn. Patient understands and agrees with treatment goals and plan. PT explains patient will be examined in standing, sitting, and lying down to see how their muscles and joints work. When they are ready, they will be asked to remove their underwear so PT can examine their perineum. The patient is also given the option of providing their own chaperone as one is not provided in our facility. The patient also has the right and is explained the right to defer or refuse any part of the evaluation or treatment including the internal exam. With the patient's consent, PT will use one gloved finger to gently assess the muscles of  the pelvic floor, seeing how well it contracts  and relaxes and if there is muscle symmetry. After, the patient will get dressed and PT and patient will discuss exam findings and plan of care. PT and patient discuss plan of care, schedule, attendance policy and HEP activities.  PELVIC MMT:   MMT eval  Vaginal 4/5; 9s; 7 reps  Internal Anal Sphincter   External Anal Sphincter   Puborectalis   Diastasis Recti   (Blank rows = not tested)        TONE: WFL  PROLAPSE: Needs to be further assessed, but not seen in hooklying with contraction or at rest   TODAY'S TREATMENT:                                                                                                                              DATE:   02/05/23:  Patient consented to internal pelvic floor assessment vaginally this date and found to have mild tension and small trigger points at Rt pubococcygeus. Manual completed here with improvement noted and pt denied pain throughout. With gentle stretching and trigger point release all tension released well. Pt did report a little cramping once getting dressed after treatment but denied pain and resolved quickly. Pt educated on findings, shown on female pelvic floor model, and given HEP and reviewed with pt. Pt denied questions.     02/20/23 Pt declined rectal assessment of pelvic floor until next appt until finished with cycle Pt had questions about supplements for fiber or adding a different/new medication for bowel movement regularity and encouraged to discuss this with medical provider as PT unable to make recommendations on this.  Pelvic tilts on ball 2x10 ant/post Pelvic tilt rt/lt on ball 2x10  Pelvic circles x20 each way on ball Quad hip shifts x15 each on yoga block Deep squat stretch on yoga block full height 3x30s  02/27/23:  Patient consented to internal pelvic floor assessment rectally this date and found to have tension at and TTP 9-12 on clock face. 12 being coccyx. Pt  denied pain but did have "tenderness" at EAS in these areas. Pt does have increased tension throughout posterior pelvic floor rectally however these were only areas of tenderness. Pt denied tailbone pain. Pt demonstrated good mechanics with contract/relax/and bulge but no increase in relaxation with balloon breathing/diaphragmatic breathing. Gentle stretching provided in all directions within pt's tolerance and did have mild release without pain. However not fully released. Pt educated on findings.  Manual at low abdomen, once pt dressed with privacy. Pt demonstrated poor mobility in mid and low abdominal quadrants. Fascial release manual work completed in these areas with Lt side focus of treatment as this was more tight than Rt. Though present bil. Pt denied pain put does endorse "pressure" she feels a lot replicated with this. Pt did demonstrate improved ability to complete diaphragmatic breathing and demonstrated good release after manual work.    PATIENT EDUCATION:  Education details: abdominal massage, voiding mechanics, fiber types Person educated: Patient Education  method: Explanation, Demonstration, Tactile cues, Verbal cues, and Handouts Education comprehension: verbalized understanding and returned demonstration  HOME EXERCISE PROGRAM: abdominal massage, voiding mechanics, fiber types, [id number]  ASSESSMENT:  CLINICAL IMPRESSION: Patient presents for treatment, pt continues to have only 1-2 bowel movements weekly and usually type 1. Is taking medication daily and drinking around 40-60oz of water daily. Has been trying to increase fiber but difficult per pt. Pt also completing good voiding mechanics and abdominal massage. Pt asked about other medication options, PT encouraged pt to ask MD about this as PT is unable to make recommendations of medication/supplements. Pt agreed. Pt does demonstrate tension with rectal pelvic floor, abdomen and manual work completed here today with good  tolerance by pt. Pt tolerated session well. Pt would benefit from additional PT to further address deficits.    OBJECTIVE IMPAIRMENTS: decreased coordination, decreased endurance, decreased strength, increased fascial restrictions, increased muscle spasms, impaired flexibility, improper body mechanics, postural dysfunction, and pain.   ACTIVITY LIMITATIONS: continence  PARTICIPATION LIMITATIONS: community activity  PERSONAL FACTORS: Time since onset of injury/illness/exacerbation are also affecting patient's functional outcome.   REHAB POTENTIAL: Good  CLINICAL DECISION MAKING: Stable/uncomplicated  EVALUATION COMPLEXITY: Low   GOALS: Goals reviewed with patient? Yes  SHORT TERM GOALS: Target date: 01/03/23  Pt to be I with HEP.  Baseline: Goal status: MET  2.  Pt to be I with breathing mechanics and pelvic floor coordination to decreased strain and tension at pelvic floor for improved bowel mechanics. .  Baseline:  Goal status: MET   LONG TERM GOALS: Target date: 03/08/23  Pt to be I with advanced HEP.  Baseline:  Goal status: INITIAL  2.  Pt will report her BMs are complete due to improved bowel habits and evacuation techniques.  Baseline:  Goal status: INITIAL  3.  Pt will report 3 BMs per week due to improved muscle tone and coordination with BMs.  Baseline:  Goal status: INITIAL  4.  Pt to demonstrate improved coordination of pelvic floor and breathing mechanics with 10# squat with appropriate synergistic patterns to decrease pain and leakage at least 75% of the time.    Baseline:  Goal status: INITIAL  5.  Pt will report no more than 2/10 pain with bowel movements due to improvements in posture, strength, and muscle length  Baseline:  Goal status: INITIAL  6.  Pt to report no more than one instance of leakage in a week to decreased symptoms.  Baseline:  Goal status: INITIAL  PLAN:  PT FREQUENCY: 1x/week  PT DURATION:  8 sessions  PLANNED  INTERVENTIONS: Therapeutic exercises, Therapeutic activity, Neuromuscular re-education, Patient/Family education, Self Care, Joint mobilization, DME instructions, Aquatic Therapy, Dry Needling, Spinal mobilization, Cryotherapy, Moist heat, scar mobilization, Taping, Traction, Biofeedback, Manual therapy, and Re-evaluation  PLAN FOR NEXT SESSION: internal assessment/treatment, manual spine/hips/core, breathing mechanics ,relaxation techniques, core and hip strengthening,   Otelia Sergeant, PT, DPT 10/01/249:33 AM

## 2023-03-06 ENCOUNTER — Ambulatory Visit: Payer: 59 | Admitting: Physical Therapy

## 2023-03-13 ENCOUNTER — Ambulatory Visit: Payer: 59 | Admitting: Physical Therapy

## 2023-05-27 ENCOUNTER — Ambulatory Visit
Admission: EM | Admit: 2023-05-27 | Discharge: 2023-05-27 | Disposition: A | Discharge status: Home / Self Care | Payer: 59 | Attending: Internal Medicine | Admitting: Internal Medicine

## 2023-05-27 ENCOUNTER — Encounter: Payer: Self-pay | Admitting: Emergency Medicine

## 2023-05-27 DIAGNOSIS — J029 Acute pharyngitis, unspecified: Secondary | ICD-10-CM

## 2023-05-27 DIAGNOSIS — B349 Viral infection, unspecified: Secondary | ICD-10-CM

## 2023-05-27 DIAGNOSIS — R051 Acute cough: Secondary | ICD-10-CM

## 2023-05-27 LAB — POC COVID19/FLU A&B COMBO
Covid Antigen, POC: NEGATIVE
Influenza A Antigen, POC: NEGATIVE
Influenza B Antigen, POC: NEGATIVE

## 2023-05-27 LAB — POCT RAPID STREP A (OFFICE): Rapid Strep A Screen: NEGATIVE

## 2023-05-27 MED ORDER — ALBUTEROL SULFATE HFA 108 (90 BASE) MCG/ACT IN AERS
1.0000 | INHALATION_SPRAY | Freq: Four times a day (QID) | RESPIRATORY_TRACT | 0 refills | Status: DC | PRN
Start: 2023-05-27 — End: 2024-04-01

## 2023-05-27 MED ORDER — PROMETHAZINE-DM 6.25-15 MG/5ML PO SYRP
5.0000 mL | ORAL_SOLUTION | Freq: Four times a day (QID) | ORAL | 0 refills | Status: DC | PRN
Start: 2023-05-27 — End: 2023-06-21

## 2023-05-27 NOTE — ED Provider Notes (Signed)
[facility]    CSN: [id number] Arrival date & time: 05/27/23  1020      History   Chief Complaint Chief Complaint  Patient presents with   Cough    HPI Debra Pruitt is a 32 y.o. female  presents for evaluation of URI symptoms for 3 days. Patient reports associated symptoms of sore throat, cough, congestion, body aches, fatigue, low-grade fevers. Denies N/V/D, ear pain, shortness of breath. Patient does have a hx of asthma.  States she has an inhaler but has not needed to use since symptom onset.  Patient is not an active smoker.   Reports mild has bronchitis.  Pt has taken TheraFlu OTC for symptoms. Pt has no other concerns at this time.    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   Melanocytic nevus of left lower extremity 08/30/2021   Localization-related idiopathic epilepsy and epileptic syndromes with seizures of localized onset, not intractable, without status epilepticus (HCC) 07/28/2021   Migraine headache without aura 07/28/2021   Mild persistent asthma 07/28/2021   Food intolerance 03/10/2021   Cervical radiculopathy 05/31/2020   Vitamin D deficiency 01/09/2020    Past Surgical History:  Procedure Laterality Date   CESAREAN SECTION  04/30/2013   x 2   MULTIPLE TOOTH EXTRACTIONS     SALPINGECTOMY Right    for ectopic pregnancy    OB History     Gravida  3   Para  2   Term  2   Preterm      AB  1   Living  2      SAB      IAB      Ectopic  1   Multiple      Live Births  2            Home Medications     Prior to Admission medications   Medication Sig Start Date End Date Taking? Authorizing Provider  albuterol (VENTOLIN HFA) 108 (90 Base) MCG/ACT inhaler Inhale 1-2 puffs into the lungs every 6 (six) hours as needed. 05/27/23  Yes Radford Pax, NP  promethazine-dextromethorphan (PROMETHAZINE-DM) 6.25-15 MG/5ML syrup Take 5 mLs by mouth 4 (four) times daily as needed for cough. 05/27/23  Yes Radford Pax, NP  linaclotide [facility]) 145 MCG CAPS capsule Take 1 capsule (145 mcg total) by mouth daily before breakfast. Patient not taking: Reported on 12/06/2022 11/28/22   Jenel Lucks, MD  polycarbophil (FIBERCON) 625 MG tablet Take 625 mg by mouth daily. Patient not taking: Reported on 11/28/2022    [provider]  polyethylene glycol (MIRALAX / GLYCOLAX) 17 g packet Take 17 g by mouth daily.    [provider]    Family History Family History  Problem Relation Age of Onset   Hypertension Mother    Thyroid disease Mother    HIV Father    Cirrhosis Father    Cancer Maternal Grandmother        Breast  Asthma Maternal Grandmother    Hypertension Maternal Grandfather    Heart disease Maternal Grandfather    Diabetes Maternal Grandfather    Hypertension Paternal Grandmother    COPD Paternal Grandmother    Heart disease Paternal Grandmother    Hyperlipidemia Paternal Grandmother    Stroke Paternal Grandmother    Diabetes Paternal Grandmother    Hepatitis C Paternal Grandmother    Rheum arthritis Paternal Grandmother    Colon cancer Neg Hx    Esophageal cancer Neg Hx    Stomach cancer Neg Hx     Social History Social History   Tobacco Use   Smoking status: Never   Smokeless tobacco: Never  Vaping Use   Vaping status: Never Used  Substance Use Topics   Alcohol use: Yes    Alcohol/week: 0.0 standard drinks of alcohol    Comment: Rare - social   Drug use: No     Allergies   Mangifera indica, Mango flavoring agent (non-screening), Pineapple, and  Other   Review of Systems Review of Systems  Constitutional:  Positive for fever.  HENT:  Positive for congestion and sore throat.   Respiratory:  Positive for cough.   Musculoskeletal:  Positive for myalgias.     Physical Exam Triage Vital Signs ED Triage Vitals  Encounter Vitals Group     BP 05/27/23 1109 117/77     Systolic BP Percentile --      Diastolic BP Percentile --      Pulse Rate 05/27/23 1109 83     Resp 05/27/23 1109 18     Temp 05/27/23 1109 99.3 F (37.4 C)     Temp Source 05/27/23 1109 Oral     SpO2 05/27/23 1109 98 %     Weight 05/27/23 1111 175 lb (79.4 kg)     Height 05/27/23 1111 5\' 6"  (1.676 m)     Head Circumference --      Peak Flow --      Pain Score 05/27/23 1111 4     Pain Loc --      Pain Education --      Exclude from Growth Chart --    No data found.  Updated Vital Signs BP 117/77 (BP Location: Left Arm)   Pulse 83   Temp 99.3 F (37.4 C) (Oral)   Resp 18   Ht 5\' 6"  (1.676 m)   Wt 175 lb (79.4 kg)   LMP 05/04/2023 (Exact Date)   SpO2 98%   Breastfeeding No   BMI 28.25 kg/m   Visual Acuity Right Eye Distance:   Left Eye Distance:   Bilateral Distance:    Right Eye Near:   Left Eye Near:    Bilateral Near:     Physical Exam Vitals and nursing note reviewed.  Constitutional:      General: She is not in acute distress.    Appearance: She is well-developed. She is not ill-appearing.  HENT:     Head: Normocephalic and atraumatic.     Right Ear: Tympanic membrane and ear canal normal.     Left Ear: Tympanic membrane and ear canal normal.     Nose: Congestion present.     Mouth/Throat:     Mouth: Mucous membranes are moist.     Pharynx: Oropharynx is clear. Uvula midline. Posterior oropharyngeal erythema present.     Tonsils: No tonsillar exudate or tonsillar abscesses.  Eyes:     Conjunctiva/sclera: Conjunctivae normal.     Pupils: Pupils are equal, round,  and reactive to light.  Cardiovascular:     Rate and Rhythm:  Normal rate and regular rhythm.     Heart sounds: Normal heart sounds.  Pulmonary:     Effort: Pulmonary effort is normal.     Breath sounds: Normal breath sounds.  Musculoskeletal:     Cervical back: Normal range of motion and neck supple.  Lymphadenopathy:     Cervical: No cervical adenopathy.  Skin:    General: Skin is warm and dry.  Neurological:     General: No focal deficit present.     Mental Status: She is alert and oriented to person, place, and time.  Psychiatric:        Mood and Affect: Mood normal.        Behavior: Behavior normal.      UC Treatments / Results  Labs (all labs ordered are listed, but only abnormal results are displayed) Labs Reviewed  CULTURE, GROUP A STREP [facility])  POCT RAPID STREP A (OFFICE)  POC COVID19/FLU A&B COMBO    EKG   Radiology No results found.  Procedures Procedures (including critical care time)  Medications Ordered in UC Medications - No data to display  Initial Impression / Assessment and Plan / UC Course  I have reviewed the triage vital signs and the nursing notes.  Pertinent labs & imaging results that were available during my care of the patient were reviewed by me and considered in my medical decision making (see chart for details).     Reviewed exam and symptoms with patient.  No red flags.  Negative rapid flu, strep, COVID testing.  Will send strep throat culture.  Discussed viral illness and symptomatic treatment.  Promethazine DM as needed for cough, side effect profile reviewed.  Refilled albuterol inhaler as needed for shortness of breath or wheezing.  PCP follow-up if symptoms do not improve.  ER precautions reviewed. Final Clinical Impressions(s) / UC Diagnoses   Final diagnoses:  Acute cough  Sore throat  Viral illness     Discharge Instructions      You may take Promethazine DM as needed for cough.  Please note this medication can make you drowsy.  Do not drink alcohol or drive while on this  medication.  I sent you an albuterol inhaler to use for any shortness of breath or wheezing.  Lots of rest and fluids.  Please follow-up with your PCP if your symptoms do not improve.  Please go to the emergency room if you develop any worsening symptoms.  I hope you feel better soon!     ED Prescriptions     Medication Sig Dispense Auth. Provider   promethazine-dextromethorphan (PROMETHAZINE-DM) 6.25-15 MG/5ML syrup Take 5 mLs by mouth 4 (four) times daily as needed for cough. 118 mL Radford Pax, NP   albuterol (VENTOLIN HFA) 108 (90 Base) MCG/ACT inhaler Inhale 1-2 puffs into the lungs every 6 (six) hours as needed. 1 each Radford Pax, NP      PDMP not reviewed this encounter.   Radford Pax, NP 05/27/23 1157

## 2023-05-27 NOTE — Discharge Instructions (Addendum)
You may take Promethazine DM as needed for cough.  Please note this medication can make you drowsy.  Do not drink alcohol or drive while on this medication.  I sent you an albuterol inhaler to use for any shortness of breath or wheezing.  Lots of rest and fluids.  Please follow-up with your PCP if your symptoms do not improve.  Please go to the emergency room if you develop any worsening symptoms.  I hope you feel better soon!

## 2023-05-27 NOTE — ED Triage Notes (Signed)
Patient c/o sore throat, cough, body soreness, fatigue x 3 days.  Patient has taken Thera-Flu and Tylenol.

## 2023-05-30 ENCOUNTER — Encounter: Payer: Self-pay | Admitting: Family Medicine

## 2023-05-30 LAB — CULTURE, GROUP A STREP (THRC)

## 2023-05-31 ENCOUNTER — Telehealth: Payer: Self-pay

## 2023-05-31 NOTE — Telephone Encounter (Signed)
 Pt called to request clarification on lab results. Pt is positive for Strep, I let her know the Medical Provider will be sending in abx. Allergies verified, rx to be sent to [facility] on [address]   Pt expressed understanding.

## 2023-05-31 NOTE — Telephone Encounter (Signed)
 Called pt back per Medical Provider.  Pt stated she no longer had a sore throat and has only body aches. Per Medical Provider if sxs have resolved or are resolving pt does not need abx. Pt expressed understanding.

## 2023-06-21 ENCOUNTER — Ambulatory Visit: Payer: Self-pay | Admitting: Family Medicine

## 2023-06-21 ENCOUNTER — Telehealth: Payer: Self-pay | Admitting: Family Medicine

## 2023-06-21 ENCOUNTER — Ambulatory Visit: Payer: 59 | Admitting: Physician Assistant

## 2023-06-21 VITALS — BP 122/82 | HR 90 | Temp 98.2°F | Ht 66.0 in | Wt 175.4 lb

## 2023-06-21 DIAGNOSIS — J4531 Mild persistent asthma with (acute) exacerbation: Secondary | ICD-10-CM | POA: Diagnosis not present

## 2023-06-21 MED ORDER — ALBUTEROL SULFATE HFA 108 (90 BASE) MCG/ACT IN AERS: 2.0000 | INHALATION_SPRAY | Freq: Four times a day (QID) | RESPIRATORY_TRACT | 2 refills | Status: DC | PRN

## 2023-06-21 MED ORDER — PREDNISONE 20 MG PO TABS
20.0000 mg | ORAL_TABLET | Freq: Every day | ORAL | 0 refills | Status: DC
Start: 2023-06-21 — End: 2024-04-01

## 2023-06-21 NOTE — Progress Notes (Signed)
Established patient visit   Patient: Debra Pruitt   DOB: [birth year]   33 y.o. Female  MRN: [id number] Visit Date: 06/21/2023  Today's healthcare provider: Alfredia Ferguson, PA-C   Chief Complaint  Patient presents with   Asthma    Present since 1/17. Feels like heart beats and winded. Starts coughing when she looses breath. Did take a steroid she had left over and it seemed to help.    Subjective    Pt presents today, with a history of asthma, but only intermittent albuterol use, that starting last week she has been short of breath, more winded, having heart palpitations. She had some left over steroids that helped.  She had a URI at the beginning of the month.   Medications: Outpatient Medications Prior to Visit  Medication Sig   albuterol (VENTOLIN HFA) 108 (90 Base) MCG/ACT inhaler Inhale 1-2 puffs into the lungs every 6 (six) hours as needed.   linaclotide (LINZESS) 145 MCG CAPS capsule Take 1 capsule (145 mcg total) by mouth daily before breakfast.   polycarbophil (FIBERCON) 625 MG tablet Take 625 mg by mouth daily.   polyethylene glycol (MIRALAX / GLYCOLAX) 17 g packet Take 17 g by mouth daily.   [DISCONTINUED] promethazine-dextromethorphan (PROMETHAZINE-DM) 6.25-15 MG/5ML syrup Take 5 mLs by mouth 4 (four) times daily as needed for cough.   No facility-administered medications prior to visit.    Review of Systems  Constitutional:  Negative for fatigue and fever.  Respiratory:  Positive for cough, chest tightness, shortness of breath and wheezing.   Cardiovascular:  Positive for palpitations. Negative for chest pain and leg swelling.  Gastrointestinal:  Negative for abdominal pain.  Neurological:  Negative for dizziness and headaches.       Objective    BP 122/82   Pulse 90   Temp 98.2 F (36.8 C) (Oral)   Ht 5\' 6"  (1.676 m)   Wt 175 lb 6 oz (79.5 kg)   LMP 05/04/2023 (Exact Date)   SpO2 100%   BMI 28.31 kg/m    Physical Exam Constitutional:       General: She is awake.     Appearance: She is well-developed.  HENT:     Head: Normocephalic.  Eyes:     Conjunctiva/sclera: Conjunctivae normal.  Cardiovascular:     Rate and Rhythm: Normal rate and regular rhythm.     Heart sounds: Normal heart sounds.  Pulmonary:     Effort: Pulmonary effort is normal.     Breath sounds: Normal breath sounds. No wheezing, rhonchi or rales.  Skin:    General: Skin is warm.  Neurological:     Mental Status: She is alert and oriented to person, place, and time.  Psychiatric:        Attention and Perception: Attention normal.        Mood and Affect: Mood normal.        Speech: Speech normal.        Behavior: Behavior is cooperative.      No results found for any visits on 06/21/23.  Assessment & Plan    Mild persistent asthma with acute exacerbation -     predniSONE; Take 1 tablet (20 mg total) by mouth daily with breakfast.  Dispense: 5 tablet; Refill: 0 -     Albuterol Sulfate HFA; Inhale 2 puffs into the lungs every 6 (six) hours as needed for wheezing or shortness of breath.  Dispense: 18 g; Refill: 2   Rx prednisone 20  mg x 5 days, encouraged  albuterol use before a trigger ( ie cold weather, exercise) and otherwise q 4-6 hours prn.   If no improvement, could add back either symbicort or breztri that pt historically took  Return if symptoms worsen or fail to improve.       Alfredia Ferguson, PA-C  [facility] at [facility] [phone] (phone) [phone] (fax)  [facility]

## 2023-06-21 NOTE — Telephone Encounter (Signed)
Chief Complaint: 4/10 chest tightness with SOB  Symptoms: 4/10 chest tightness, SOB and worsening asthma symptoms especially on exertion, cough Frequency: last Friday Pertinent Negatives: Patient denies dizziness, passing out, fever, N/V/abd pain Disposition: [x] ED /[] Urgent Care (no appt availability in office) / [] Appointment(In office/virtual)/ []  [facility]/ [] Home Care/ [x] Refused Recommended Disposition /[] [facility] [place] [facility]/ []  Follow-up with PCP  Additional Notes: Pt reports 4/10 tightness that radiates to her back since last Friday. Pt reports SOB especially on exertion (climbing stairs). Pt endorses cough as well. Pt reports hx of asthma and feels this is related to that. Pt states her inhaler has not been helpful at relieving symptoms. Pt also states she has been taking leftover prednisone tablets from a prior prescription. Pt states prednisone helps with symptoms. Per protocol, this RN advised pt to go to the ED. Pt declined disposition, stating last time she went to the ED she was there for 8 hours. Pt advised this RN will route concerns to the appropriate people for follow-up. Additionally, pt states she scheduled herself an appt through MyChart for tomorrow, but was also transferred to nurse triage given her symptoms. Therefore, pt has an appt scheduled for tomorrow that she made herself, despite protocol stating she should be seen in the ED. This RN also advised that pt should call back or call EMS for worsening symptoms. Pt verbalized understanding.   Reason for Disposition  [1] Chest pain (or "angina") comes and goes AND [2] is happening more often (increasing in frequency) or getting worse (increasing in severity)  (Exception: Chest pains that last only a few seconds.)  Difficulty breathing  Answer Assessment - Initial Assessment Questions 1. LOCATION: "Where does it hurt?"       Center of the chest is sore/tight, pt believes pain is due to coughing while SOB 2.  RADIATION: "Does the pain go anywhere else?" (e.g., into neck, jaw, arms, back)     "My back is sore but I assume that is from coughing" 3. ONSET: "When did the chest pain begin?" (Minutes, hours or days)      Last Friday 4. PATTERN: "Does the pain come and go, or has it been constant since it started?"  "Does it get worse with exertion?"      "When I take the steroid, it goes away" 5. DURATION: "How long does it last" (e.g., seconds, minutes, hours)     "Persistent until I get the steroid" - inhaler not helpful 6. SEVERITY: "How bad is the pain?"  (e.g., Scale 1-10; mild, moderate, or severe)    - MILD (1-3): doesn't interfere with normal activities     - MODERATE (4-7): interferes with normal activities or awakens from sleep    - SEVERE (8-10): excruciating pain, unable to do any normal activities       4/10 chest "tightness", would not say it is painful ("I struggle to breathe") 7. CARDIAC RISK FACTORS: "Do you have any history of heart problems or risk factors for heart disease?" (e.g., angina, prior heart attack; diabetes, high blood pressure, high cholesterol, smoker, or strong family history of heart disease)     No 8. PULMONARY RISK FACTORS: "Do you have any history of lung disease?"  (e.g., blood clots in lung, asthma, emphysema, birth control pills)     Asthma - pt states she believes she is having worsening asthma. 9. CAUSE: "What do you think is causing the chest pain?"     Asthma 10. OTHER SYMPTOMS: "Do you have  any other symptoms?" (e.g., dizziness, nausea, vomiting, sweating, fever, difficulty breathing, cough)       SOB especially with exertion (pt uses inhaler). Pt has been taking leftover prednisone.  Protocols used: Chest Pain-A-AH

## 2023-06-21 NOTE — Telephone Encounter (Signed)
FYI: This call has been transferred to triage nurse: the Triage Nurse. Once the result note has been entered staff can address the message at that time.  Patient called in with the following symptoms:  Red Word:chest pain and shortness of breath, pt scheduled via mychart for tomorrow 06/22/23, I called pt and transferred over to nurse triage.   Please advise at [facility] [phone]  Message is routed to Provider Pool.

## 2023-06-22 ENCOUNTER — Ambulatory Visit: Payer: 59 | Admitting: Family Medicine

## 2023-06-26 ENCOUNTER — Encounter: Payer: Self-pay | Admitting: Physician Assistant

## 2023-07-02 ENCOUNTER — Ambulatory Visit (INDEPENDENT_AMBULATORY_CARE_PROVIDER_SITE_OTHER): Payer: 59 | Admitting: Family

## 2023-07-02 ENCOUNTER — Other Ambulatory Visit: Payer: Self-pay | Admitting: Family

## 2023-07-02 VITALS — BP 115/81 | HR 85 | Temp 98.5°F | Ht 66.25 in | Wt 174.6 lb

## 2023-07-02 DIAGNOSIS — N809 Endometriosis, unspecified: Secondary | ICD-10-CM | POA: Diagnosis not present

## 2023-07-02 DIAGNOSIS — Z803 Family history of malignant neoplasm of breast: Secondary | ICD-10-CM

## 2023-07-02 DIAGNOSIS — Z6827 Body mass index (BMI) 27.0-27.9, adult: Secondary | ICD-10-CM | POA: Diagnosis not present

## 2023-07-02 DIAGNOSIS — N644 Mastodynia: Secondary | ICD-10-CM | POA: Diagnosis not present

## 2023-07-02 DIAGNOSIS — F411 Generalized anxiety disorder: Secondary | ICD-10-CM | POA: Diagnosis not present

## 2023-07-02 DIAGNOSIS — E663 Overweight: Secondary | ICD-10-CM

## 2023-07-02 DIAGNOSIS — Z7689 Persons encountering health services in other specified circumstances: Secondary | ICD-10-CM

## 2023-07-02 MED ORDER — HYDROXYZINE PAMOATE 25 MG PO CAPS
25.0000 mg | ORAL_CAPSULE | Freq: Three times a day (TID) | ORAL | 1 refills | Status: DC | PRN
Start: 2023-07-02 — End: 2024-04-01

## 2023-07-02 MED ORDER — FLUOXETINE HCL 10 MG PO TABS
10.0000 mg | ORAL_TABLET | Freq: Every day | ORAL | 1 refills | Status: DC
Start: 2023-07-02 — End: 2023-07-24

## 2023-07-02 MED ORDER — PHENTERMINE HCL 15 MG PO CAPS
15.0000 mg | ORAL_CAPSULE | ORAL | 0 refills | Status: DC
Start: 2023-07-02 — End: 2023-07-31

## 2023-07-02 NOTE — Progress Notes (Signed)
Patient states she has some concerns.  Pain in right breast Endo pain and options available Anxiety

## 2023-07-02 NOTE — Progress Notes (Signed)
Subjective:    Debra Pruitt - 33 y.o. female MRN [id number]  Date of birth: 20-Oct-1990  HPI  Debra Pruitt is to establish care.   Current issues and/or concerns: - Right breast pain. Family history of breast cancer. - History of ectopic pregnancy and endometriosis (2017). In the past established with Gynecology.  - Anxiety related to work-life balance. She denies thoughts of self-harm, suicidal ideations, homicidal ideations. - Weight gain. States she does not watch what she eats. She does not exercise outside of normal routine.  - No further issues/concerns for discussion today.  ROS per HPI     Health Maintenance:  There are no preventive care reminders to display for this patient.   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 She has never used smokeless tobacco. She reports current alcohol use. She reports that she does not use drugs.   Family History  family history includes Asthma in her maternal grandmother; COPD in her paternal grandmother; Cancer in her maternal grandmother; Cirrhosis in her father; Diabetes in her maternal grandfather and paternal grandmother; HIV in her father; Heart disease in her maternal grandfather and paternal grandmother; Hepatitis C in her paternal grandmother; Hyperlipidemia in her paternal grandmother; Hypertension in her maternal grandfather, mother, and  paternal grandmother; Rheum arthritis in her paternal grandmother; Stroke in her paternal grandmother; Thyroid disease in her mother.   Medications: reviewed and updated   Objective:   Physical Exam BP 115/81   Pulse 85   Temp 98.5 F (36.9 C) (Oral)   Ht 5' 6.25" (1.683 m)   Wt 174 lb 9.6 oz (79.2 kg)   LMP 06/30/2023 (Exact Date)   SpO2 99%   BMI 27.97 kg/m   Physical Exam HENT:     Head: Normocephalic and atraumatic.     Nose: Nose normal.     Mouth/Throat:     Mouth: Mucous membranes are moist.     Pharynx: Oropharynx is clear.  Eyes:     Extraocular Movements: Extraocular movements intact.     Conjunctiva/sclera: Conjunctivae normal.     Pupils: Pupils are equal, round, and reactive to light.  Cardiovascular:     Rate and Rhythm: Normal rate and regular rhythm.     Pulses: Normal pulses.     Heart sounds: Normal heart sounds.  Pulmonary:     Effort: Pulmonary effort is normal.     Breath sounds: Normal breath sounds.  Musculoskeletal:        General: Normal range of motion.     Cervical back: Normal range of motion and neck supple.  Neurological:     General: No focal deficit present.     Mental Status: She is alert and oriented to person, place, and time.  Psychiatric:        Mood and Affect: Mood normal.        Behavior: Behavior normal.        Assessment & Plan:  1. Encounter to  establish care (Primary) - Patient presents today to establish care. During the interim follow-up with primary provider as scheduled.  - Return for annual physical examination, labs, and health maintenance. Arrive fasting meaning having no food for at least 8 hours prior to appointment. You may have only water or black coffee. Please take scheduled medications as normal.  2. Breast pain 3. Family history of breast cancer - Routine screening.  - MM Digital Diagnostic Bilat; Future  4. Endometriosis - Referral to Gynecology for evaluation/management. - Ambulatory referral to  Gynecology  5. Generalized anxiety disorder - Patient denies thoughts of self-harm, suicidal ideations, homicidal ideations. - Fluoxetine and Hydroxyzine as prescribed. Counseled on medication adherence/adverse effects. - Patient declined referral to Psychiatry.  - Follow-up with primary provider in 4 weeks or sooner if needed.  - FLUoxetine (PROZAC) 10 MG tablet; Take 1 tablet (10 mg total) by mouth daily.  Dispense: 30 tablet; Refill: 1 - hydrOXYzine (VISTARIL) 25 MG capsule; Take 1 capsule (25 mg total) by mouth every 8 (eight) hours as needed.  Dispense: 30 capsule; Refill: 1  6. Encounter for weight management 7. BMI 27.0-27.9,adult - Phentermine as prescribed. Counseled on medication adherence and adverse effects.  - I did check the [facility] prescription drug database. - Counseled on low-sodium DASH diet and 150 minutes of moderate intensity exercise per week as tolerated to assist with weight management.  - Follow-up with primary provider in 4 weeks or sooner if needed. - phentermine 15 MG capsule; Take 1 capsule (15 mg total) by mouth every morning.  Dispense: 30 capsule; Refill: 0    Patient was given clear instructions to go to Emergency Department or return to medical center if symptoms don't improve, worsen, or new problems develop.The patient verbalized understanding.  I discussed the assessment and treatment plan with the patient. The patient was provided an opportunity to ask questions and all were answered. The patient agreed with the plan and demonstrated an understanding of the instructions.   The patient was advised to call back or seek an in-person evaluation if the symptoms worsen or if the condition fails to improve as anticipated.    Ricky Stabs, NP 07/02/2023, 9:48 AM Primary Care at [facility]

## 2023-07-18 ENCOUNTER — Ambulatory Visit
Admission: RE | Admit: 2023-07-18 | Discharge: 2023-07-18 | Disposition: A | Discharge status: Home / Self Care | Payer: 59 | Source: Ambulatory Visit | Attending: Family | Admitting: Family

## 2023-07-18 ENCOUNTER — Ambulatory Visit: Payer: 59

## 2023-07-18 DIAGNOSIS — N644 Mastodynia: Secondary | ICD-10-CM

## 2023-07-18 DIAGNOSIS — Z803 Family history of malignant neoplasm of breast: Secondary | ICD-10-CM

## 2023-07-19 ENCOUNTER — Encounter: Payer: Self-pay | Admitting: Family

## 2023-07-24 ENCOUNTER — Other Ambulatory Visit: Payer: Self-pay | Admitting: Family

## 2023-07-24 ENCOUNTER — Other Ambulatory Visit: Payer: Self-pay

## 2023-07-24 DIAGNOSIS — F411 Generalized anxiety disorder: Secondary | ICD-10-CM

## 2023-07-24 MED ORDER — FLUOXETINE HCL 10 MG PO TABS
10.0000 mg | ORAL_TABLET | Freq: Every day | ORAL | 0 refills | Status: DC
Start: 2023-07-24 — End: 2023-10-04

## 2023-07-24 NOTE — Telephone Encounter (Signed)
 Complete

## 2023-07-31 ENCOUNTER — Ambulatory Visit (INDEPENDENT_AMBULATORY_CARE_PROVIDER_SITE_OTHER): Payer: 59 | Admitting: Family

## 2023-07-31 VITALS — BP 107/73 | HR 84 | Temp 98.7°F | Ht 66.0 in | Wt 173.8 lb

## 2023-07-31 DIAGNOSIS — Z0000 Encounter for general adult medical examination without abnormal findings: Secondary | ICD-10-CM | POA: Diagnosis not present

## 2023-07-31 DIAGNOSIS — Z131 Encounter for screening for diabetes mellitus: Secondary | ICD-10-CM | POA: Diagnosis not present

## 2023-07-31 DIAGNOSIS — Z130 Encounter for screening for diseases of the blood and blood-forming organs and certain disorders involving the immune mechanism: Secondary | ICD-10-CM

## 2023-07-31 DIAGNOSIS — Z7689 Persons encountering health services in other specified circumstances: Secondary | ICD-10-CM

## 2023-07-31 DIAGNOSIS — Z13220 Encounter for screening for lipoid disorders: Secondary | ICD-10-CM

## 2023-07-31 DIAGNOSIS — F411 Generalized anxiety disorder: Secondary | ICD-10-CM

## 2023-07-31 DIAGNOSIS — Z1329 Encounter for screening for other suspected endocrine disorder: Secondary | ICD-10-CM

## 2023-07-31 DIAGNOSIS — Z6828 Body mass index (BMI) 28.0-28.9, adult: Secondary | ICD-10-CM

## 2023-07-31 DIAGNOSIS — Z13228 Encounter for screening for other metabolic disorders: Secondary | ICD-10-CM

## 2023-07-31 MED ORDER — PHENTERMINE HCL 30 MG PO CAPS
30.0000 mg | ORAL_CAPSULE | ORAL | 0 refills | Status: DC
Start: 2023-07-31 — End: 2023-08-28

## 2023-07-31 NOTE — Progress Notes (Signed)
 Patient states no concerns to discuss.

## 2023-07-31 NOTE — Progress Notes (Signed)
 Patient ID: Debra Pruitt, female    DOB: [birth year]  MRN: [id number]  CC: Annual Exam  Subjective: Debra Pruitt is a 33 y.o. female who presents for annual exam.   Her concerns today include:  - Doing well on Fluoxetine and Hydroxyzine, no issues/concerns. She denies thoughts of self-harm, suicidal ideations, homicidal ideations. - Doing well on Phentermine, no issues/concerns.   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   Melanocytic nevus of left lower extremity 08/30/2021   Localization-related idiopathic epilepsy and epileptic syndromes with seizures of localized onset, not intractable, without status epilepticus (HCC) 07/28/2021   Migraine headache without aura 07/28/2021   Mild persistent asthma 07/28/2021   Food intolerance 03/10/2021   Cervical radiculopathy 05/31/2020   Vitamin D deficiency 01/09/2020     Current Outpatient Medications on File Prior to Visit  Medication Sig Dispense Refill   albuterol (VENTOLIN HFA) 108 (90 Base) MCG/ACT inhaler Inhale 1-2 puffs into the lungs every 6 (six) hours as needed. 1 each 0   FLUoxetine (PROZAC) 10 MG tablet TAKE 1 TABLET BY MOUTH EVERY DAY 90 tablet 1   hydrOXYzine (VISTARIL) 25 MG capsule Take 1 capsule (25 mg total) by mouth every 8 (eight) hours as needed. 30 capsule 1   linaclotide (LINZESS) 145 MCG CAPS capsule Take 1 capsule (145 mcg total) by mouth daily before breakfast. 90 capsule 1   albuterol (VENTOLIN HFA) 108 (90 Base) MCG/ACT inhaler Inhale 2 puffs into the lungs every 6 (six) hours as needed for wheezing or shortness of breath. (Patient not taking: Reported on 07/31/2023) 18 g 2   FLUoxetine (PROZAC) 10 MG tablet Take 1 tablet (10 mg total) by mouth daily. (Patient not taking: Reported on 07/31/2023) 90 tablet 0   polycarbophil (FIBERCON) 625 MG tablet Take 625 mg by mouth daily. (Patient not taking:  Reported on 07/31/2023)     polyethylene glycol (MIRALAX / GLYCOLAX) 17 g packet Take 17 g by mouth daily. (Patient not taking: Reported on 07/31/2023)     predniSONE (DELTASONE) 20 MG tablet Take 1 tablet (20 mg total) by mouth daily with breakfast. (Patient not taking: Reported on 07/31/2023) 5 tablet 0   No current facility-administered medications on file prior to visit.    Allergies  Allergen Reactions   Mangifera Indica Swelling   Mango Flavoring Agent (Non-Screening) Swelling   Pineapple Swelling   Other Hives, Itching and Rash    Goli Nutrition supplement    Social History   Socioeconomic History   Marital status: Married    Spouse name: Not on file   Number of children: 2   Years of education: Not on file   Highest education level: Some college, no degree  Occupational History   Occupation: Theatre manager   Occupation: Interior and spatial designer  Tobacco Use   Smoking status: Never   Smokeless tobacco: Never  Vaping Use   Vaping status: Never Used  Substance and Sexual Activity   Alcohol use: Yes    Alcohol/week: 0.0 standard drinks of alcohol    Comment: Rare - social   Drug use: No   Sexual activity: Yes    Birth control/protection: None  Other Topics Concern   Not on file  Social History Narrative   Not on file   Social Drivers of Health   Financial Resource Strain: Low Risk  (06/21/2023)   Overall Financial Resource Strain (CARDIA)  Difficulty of Paying Living Expenses: Not hard at all  Food Insecurity: No Food Insecurity (06/21/2023)   Hunger Vital Sign    Worried About Running Out of Food in the Last Year: Never true    Ran Out of Food in the Last Year: Never true  Transportation Needs: No Transportation Needs (06/21/2023)   PRAPARE - Administrator, Civil Service (Medical): No    Lack of Transportation (Non-Medical): No  Physical Activity: Insufficiently Active (06/21/2023)   Exercise Vital Sign    Days of Exercise per Week: 3 days    Minutes of  Exercise per Session: 20 min  Stress: No Stress Concern Present (06/21/2023)   [employer] of Occupational Health - Occupational Stress Questionnaire    Feeling of Stress : Only a little  Social Connections: Moderately Integrated (06/21/2023)   Social Connection and Isolation Panel [NHANES]    Frequency of Communication with Friends and Family: More than three times a week    Frequency of Social Gatherings with Friends and Family: Once a week    Attends Religious Services: 1 to 4 times per year    Active Member of [employer] or Organizations: No    Attends Engineer, structural: Not on file    Marital Status: Married  Catering manager Violence: Not on file    Family History  Problem Relation Age of Onset   Hypertension Mother    Thyroid disease Mother    HIV Father    Cirrhosis Father    Breast cancer Maternal Aunt    Breast cancer Maternal Grandmother    Asthma Maternal Grandmother    Hypertension Maternal Grandfather    Heart disease Maternal Grandfather    Diabetes Maternal Grandfather    Hypertension Paternal Grandmother    COPD Paternal Grandmother    Heart disease Paternal Grandmother    Hyperlipidemia Paternal Grandmother    Stroke Paternal Grandmother    Diabetes Paternal Grandmother    Hepatitis C Paternal Grandmother    Rheum arthritis Paternal Grandmother    Colon cancer Neg Hx    Esophageal cancer Neg Hx    Stomach cancer Neg Hx     Past Surgical History:  Procedure Laterality Date   CESAREAN SECTION  04/30/2013   x 2   MULTIPLE TOOTH EXTRACTIONS     SALPINGECTOMY Right    for ectopic pregnancy    ROS: Review of Systems Negative except as stated above  PHYSICAL EXAM: BP 107/73   Pulse 84   Temp 98.7 F (37.1 C) (Oral)   Ht 5\' 6"  (1.676 m)   Wt 173 lb 12.8 oz (78.8 kg)   LMP 07/25/2023 (Exact Date)   SpO2 96%   BMI 28.05 kg/m   Physical Exam HENT:     Head: Normocephalic and atraumatic.     Right Ear: Tympanic membrane, ear canal  and external ear normal.     Left Ear: Tympanic membrane, ear canal and external ear normal.     Nose: Nose normal.     Mouth/Throat:     Mouth: Mucous membranes are moist.     Pharynx: Oropharynx is clear.  Eyes:     Extraocular Movements: Extraocular movements intact.     Conjunctiva/sclera: Conjunctivae normal.     Pupils: Pupils are equal, round, and reactive to light.  Neck:     Thyroid: No thyroid mass, thyromegaly or thyroid tenderness.  Cardiovascular:     Rate and Rhythm: Normal rate and regular rhythm.  Pulses: Normal pulses.     Heart sounds: Normal heart sounds.  Pulmonary:     Effort: Pulmonary effort is normal.     Breath sounds: Normal breath sounds.  Chest:     Comments: Patient declined. Abdominal:     General: Bowel sounds are normal.     Palpations: Abdomen is soft.  Genitourinary:    Comments: Patient declined. Musculoskeletal:        General: Normal range of motion.     Right shoulder: Normal.     Left shoulder: Normal.     Right upper arm: Normal.     Left upper arm: Normal.     Right elbow: Normal.     Left elbow: Normal.     Right forearm: Normal.     Left forearm: Normal.     Right wrist: Normal.     Left wrist: Normal.     Right hand: Normal.     Left hand: Normal.     Cervical back: Normal, normal range of motion and neck supple.     Thoracic back: Normal.     Lumbar back: Normal.     Right hip: Normal.     Left hip: Normal.     Right upper leg: Normal.     Left upper leg: Normal.     Right knee: Normal.     Left knee: Normal.     Right lower leg: Normal.     Left lower leg: Normal.     Right ankle: Normal.     Left ankle: Normal.     Right foot: Normal.     Left foot: Normal.  Skin:    General: Skin is warm and dry.     Capillary Refill: Capillary refill takes less than 2 seconds.  Neurological:     General: No focal deficit present.     Mental Status: She is alert and oriented to person, place, and time.  Psychiatric:         Mood and Affect: Mood normal.        Behavior: Behavior normal.    ASSESSMENT AND PLAN: 1. Annual physical exam (Primary) - Counseled on 150 minutes of exercise per week as tolerated, healthy eating (including decreased daily intake of saturated fats, cholesterol, added sugars, sodium), STI prevention, and routine healthcare maintenance.  2. Screening for metabolic disorder - Routine screening.  - CMP14+EGFR  3. Screening for deficiency anemia - Routine screening.  - CBC  4. Diabetes mellitus screening - Routine screening.  - Hemoglobin A1c  5. Screening cholesterol level - Routine screening.  - Lipid panel  6. Thyroid disorder screen - Routine screening.  - TSH  7. Generalized anxiety disorder - Patient denies thoughts of self-harm, suicidal ideations, homicidal ideations. - Continue Fluoxetine and Hydroxyzine as prescribed. Counseled on medication adherence/adverse effects. No refills needed as of present.  - Follow-up with primary provider in 3 months or sooner if needed.   8. Encounter for weight management 9. BMI 28.0-28.9,adult - Increase Phentermine from 15 mg to 30 mg as prescribed. Counseled on medication adherence/adverse effects.  - Follow-up with primary provider in 4 weeks or sooner if needed. - phentermine 30 MG capsule; Take 1 capsule (30 mg total) by mouth every morning.  Dispense: 30 capsule; Refill: 0    Patient was given the opportunity to ask questions.  Patient verbalized understanding of the plan and was able to repeat key elements of the plan. Patient was given clear instructions to go to  Emergency Department or return to medical center if symptoms don't improve, worsen, or new problems develop.The patient verbalized understanding.   Orders Placed This Encounter  Procedures   CBC   Lipid panel   CMP14+EGFR   Hemoglobin A1c   TSH     Requested Prescriptions   Signed Prescriptions Disp Refills   phentermine 30 MG capsule 30 capsule 0     Sig: Take 1 capsule (30 mg total) by mouth every morning.    Return in about 1 year (around 07/30/2024) for Physical per patient preference and 4 weeks weight check.  Rema Fendt, NP

## 2023-08-01 ENCOUNTER — Encounter: Payer: Self-pay | Admitting: Family

## 2023-08-01 ENCOUNTER — Other Ambulatory Visit: Payer: Self-pay | Admitting: Family

## 2023-08-01 DIAGNOSIS — Z13220 Encounter for screening for lipoid disorders: Secondary | ICD-10-CM

## 2023-08-01 LAB — CMP14+EGFR
ALT: 14 IU/L (ref 0–32)
AST: 17 IU/L (ref 0–40)
Albumin: 4.1 g/dL (ref 3.9–4.9)
Alkaline Phosphatase: 65 IU/L (ref 44–121)
BUN/Creatinine Ratio: 16 (ref 9–23)
BUN: 13 mg/dL (ref 6–20)
Bilirubin Total: 0.4 mg/dL (ref 0.0–1.2)
CO2: 25 mmol/L (ref 20–29)
Calcium: 9.3 mg/dL (ref 8.7–10.2)
Chloride: 103 mmol/L (ref 96–106)
Creatinine, Ser: 0.8 mg/dL (ref 0.57–1.00)
Globulin, Total: 2.8 g/dL (ref 1.5–4.5)
Glucose: 90 mg/dL (ref 70–99)
Potassium: 4.2 mmol/L (ref 3.5–5.2)
Sodium: 141 mmol/L (ref 134–144)
Total Protein: 6.9 g/dL (ref 6.0–8.5)
eGFR: 98 mL/min/{1.73_m2} (ref >59)

## 2023-08-01 LAB — LIPID PANEL
Chol/HDL Ratio: 2.8 ratio (ref 0.0–4.4)
Cholesterol, Total: 179 mg/dL (ref 100–199)
HDL: 65 mg/dL (ref >39)
LDL Chol Calc (NIH): 105 mg/dL — ABNORMAL HIGH (ref 0–99)
Triglycerides: 46 mg/dL (ref 0–149)
VLDL Cholesterol Cal: 9 mg/dL (ref 5–40)

## 2023-08-01 LAB — CBC
Hematocrit: 40.1 % (ref 34.0–46.6)
Hemoglobin: 13.1 g/dL (ref 11.1–15.9)
MCH: 31 pg (ref 26.6–33.0)
MCHC: 32.7 g/dL (ref 31.5–35.7)
MCV: 95 fL (ref 79–97)
Platelets: 239 10*3/uL (ref 150–450)
RBC: 4.22 x10E6/uL (ref 3.77–5.28)
RDW: 12.2 % (ref 11.7–15.4)
WBC: 5.2 10*3/uL (ref 3.4–10.8)

## 2023-08-01 LAB — HEMOGLOBIN A1C
Est. average glucose Bld gHb Est-mCnc: 105 mg/dL
Hgb A1c MFr Bld: 5.3 % (ref 4.8–5.6)

## 2023-08-01 LAB — TSH: TSH: 1.34 u[IU]/mL (ref 0.450–4.500)

## 2023-08-03 ENCOUNTER — Other Ambulatory Visit: Payer: 59

## 2023-08-20 ENCOUNTER — Ambulatory Visit: Payer: 59 | Admitting: Family

## 2023-08-21 ENCOUNTER — Other Ambulatory Visit (HOSPITAL_COMMUNITY)
Admission: RE | Admit: 2023-08-21 | Discharge: 2023-08-21 | Disposition: A | Discharge status: Home / Self Care | Source: Ambulatory Visit | Attending: Family | Admitting: Family

## 2023-08-21 ENCOUNTER — Ambulatory Visit: Admitting: Family

## 2023-08-21 ENCOUNTER — Encounter: Payer: Self-pay | Admitting: Family

## 2023-08-21 VITALS — BP 112/78 | HR 81 | Temp 98.4°F | Ht 66.0 in | Wt 172.8 lb

## 2023-08-21 DIAGNOSIS — B9689 Other specified bacterial agents as the cause of diseases classified elsewhere: Secondary | ICD-10-CM | POA: Diagnosis not present

## 2023-08-21 DIAGNOSIS — B3731 Acute candidiasis of vulva and vagina: Secondary | ICD-10-CM | POA: Insufficient documentation

## 2023-08-21 DIAGNOSIS — N920 Excessive and frequent menstruation with regular cycle: Secondary | ICD-10-CM

## 2023-08-21 DIAGNOSIS — N760 Acute vaginitis: Secondary | ICD-10-CM | POA: Diagnosis not present

## 2023-08-21 LAB — POCT URINALYSIS DIP (CLINITEK)
Bilirubin, UA: NEGATIVE
Blood, UA: NEGATIVE
Glucose, UA: NEGATIVE mg/dL
Ketones, POC UA: NEGATIVE mg/dL
Leukocytes, UA: NEGATIVE
Nitrite, UA: NEGATIVE
POC PROTEIN,UA: NEGATIVE
Spec Grav, UA: 1.015 (ref 1.010–1.025)
Urobilinogen, UA: 0.2 U/dL
pH, UA: 7 (ref 5.0–8.0)

## 2023-08-21 LAB — CERVICOVAGINAL ANCILLARY ONLY
Bacterial Vaginitis (gardnerella): POSITIVE — AB
Candida Glabrata: NEGATIVE
Candida Vaginitis: POSITIVE — AB
Chlamydia: NEGATIVE
Comment: NEGATIVE
Comment: NEGATIVE
Comment: NEGATIVE
Comment: NEGATIVE
Comment: NEGATIVE
Comment: NORMAL
Neisseria Gonorrhea: NEGATIVE
Trichomonas: NEGATIVE

## 2023-08-21 LAB — POCT URINE PREGNANCY: Preg Test, Ur: NEGATIVE

## 2023-08-21 NOTE — Progress Notes (Signed)
 Patient ID: Debra Pruitt, female    DOB: 1991/02/22  MRN: [id number]  CC: Spotting  Subjective: Debra Pruitt is a 33 y.o. female who presents for spotting. She is accompanied by her husband.  Her concerns today include:  States vaginal spotting began early this morning with wiping. States later this morning when she returned to the restroom she was no longer spotting. States breast tenderness, motion sickness, and lower back pain radiating to stomach for several days. Denies recent trauma/injury and red flag symptoms. She declines pharmacological therapy on today. Reports she had two periods in February. Reports she has an upcoming appointment with Gynecology. Reports history of tubal ligation. No further issues/concerns for discussion today.  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   Melanocytic nevus of left lower extremity 08/30/2021   Localization-related idiopathic epilepsy and epileptic syndromes with seizures of localized onset, not intractable, without status epilepticus (HCC) 07/28/2021   Migraine headache without aura 07/28/2021   Mild persistent asthma 07/28/2021   Food intolerance 03/10/2021   Cervical radiculopathy 05/31/2020   Vitamin D deficiency 01/09/2020     Current Outpatient Medications on File Prior to Visit  Medication Sig Dispense Refill   albuterol (VENTOLIN HFA) 108 (90 Base) MCG/ACT inhaler Inhale 1-2 puffs into the lungs every 6 (six) hours as needed. 1 each 0   FLUoxetine (PROZAC) 10 MG tablet TAKE 1 TABLET BY MOUTH EVERY DAY 90 tablet 1   hydrOXYzine (VISTARIL) 25 MG capsule Take 1 capsule (25 mg total) by mouth every 8 (eight) hours as needed. 30 capsule 1   linaclotide (LINZESS) 145 MCG CAPS capsule Take 1 capsule (145 mcg total) by mouth daily before breakfast. 90 capsule 1   phentermine 30 MG capsule Take 1 capsule (30 mg total) by  mouth every morning. 30 capsule 0   albuterol (VENTOLIN HFA) 108 (90 Base) MCG/ACT inhaler Inhale 2 puffs into the lungs every 6 (six) hours as needed for wheezing or shortness of breath. (Patient not taking: Reported on 08/21/2023) 18 g 2   FLUoxetine (PROZAC) 10 MG tablet Take 1 tablet (10 mg total) by mouth daily. (Patient not taking: Reported on 08/21/2023) 90 tablet 0   polycarbophil (FIBERCON) 625 MG tablet Take 625 mg by mouth daily. (Patient not taking: Reported on 07/02/2023)     polyethylene glycol (MIRALAX / GLYCOLAX) 17 g packet Take 17 g by mouth daily. (Patient not taking: Reported on 08/21/2023)     predniSONE (DELTASONE) 20 MG tablet Take 1 tablet (20 mg total) by mouth daily with breakfast. (Patient not taking: Reported on 07/02/2023) 5 tablet 0   No current facility-administered medications on file prior to visit.    Allergies  Allergen Reactions   Mangifera Indica Swelling   Mango Flavoring Agent (Non-Screening) Swelling   Pineapple Swelling   Other Hives, Itching and Rash    Goli Nutrition supplement    Social History   Socioeconomic History   Marital status: Married    Spouse name: Not on file   Number of children: 2   Years of education: Not on file   Highest education level: Some college, no degree  Occupational History   Occupation: Theatre manager   Occupation: Interior and spatial designer  Tobacco Use   Smoking status: Never   Smokeless tobacco: Never  Vaping Use   Vaping status: Never Used  Substance and Sexual Activity   Alcohol use:  Yes    Alcohol/week: 0.0 standard drinks of alcohol    Comment: Rare - social   Drug use: No   Sexual activity: Yes    Birth control/protection: None  Other Topics Concern   Not on file  Social History Narrative   Not on file   Social Drivers of Health   Financial Resource Strain: Low Risk  (06/21/2023)   Overall Financial Resource Strain (CARDIA)    Difficulty of Paying Living Expenses: Not hard at all  Food Insecurity: No  Food Insecurity (06/21/2023)   Hunger Vital Sign    Worried About Running Out of Food in the Last Year: Never true    Ran Out of Food in the Last Year: Never true  Transportation Needs: No Transportation Needs (06/21/2023)   PRAPARE - Administrator, Civil Service (Medical): No    Lack of Transportation (Non-Medical): No  Physical Activity: Insufficiently Active (06/21/2023)   Exercise Vital Sign    Days of Exercise per Week: 3 days    Minutes of Exercise per Session: 20 min  Stress: No Stress Concern Present (06/21/2023)   [employer] of Occupational Health - Occupational Stress Questionnaire    Feeling of Stress : Only a little  Social Connections: Moderately Integrated (06/21/2023)   Social Connection and Isolation Panel [NHANES]    Frequency of Communication with Friends and Family: More than three times a week    Frequency of Social Gatherings with Friends and Family: Once a week    Attends Religious Services: 1 to 4 times per year    Active Member of [employer] or Organizations: No    Attends Engineer, structural: Not on file    Marital Status: Married  Catering manager Violence: Not on file    Family History  Problem Relation Age of Onset   Hypertension Mother    Thyroid disease Mother    HIV Father    Cirrhosis Father    Breast cancer Maternal Aunt    Breast cancer Maternal Grandmother    Asthma Maternal Grandmother    Hypertension Maternal Grandfather    Heart disease Maternal Grandfather    Diabetes Maternal Grandfather    Hypertension Paternal Grandmother    COPD Paternal Grandmother    Heart disease Paternal Grandmother    Hyperlipidemia Paternal Grandmother    Stroke Paternal Grandmother    Diabetes Paternal Grandmother    Hepatitis C Paternal Grandmother    Rheum arthritis Paternal Grandmother    Colon cancer Neg Hx    Esophageal cancer Neg Hx    Stomach cancer Neg Hx     Past Surgical History:  Procedure Laterality Date   CESAREAN  SECTION  04/30/2013   x 2   MULTIPLE TOOTH EXTRACTIONS     SALPINGECTOMY Right    for ectopic pregnancy    ROS: Review of Systems Negative except as stated above  PHYSICAL EXAM: BP 112/78   Pulse 81   Temp 98.4 F (36.9 C) (Oral)   Ht 5\' 6"  (1.676 m)   Wt 172 lb 12.8 oz (78.4 kg)   LMP 07/25/2023 (Exact Date)   SpO2 98%   BMI 27.89 kg/m   Physical Exam HENT:     Head: Normocephalic and atraumatic.     Nose: Nose normal.     Mouth/Throat:     Mouth: Mucous membranes are moist.     Pharynx: Oropharynx is clear.  Eyes:     Extraocular Movements: Extraocular movements intact.  Conjunctiva/sclera: Conjunctivae normal.     Pupils: Pupils are equal, round, and reactive to light.  Cardiovascular:     Rate and Rhythm: Normal rate and regular rhythm.     Pulses: Normal pulses.     Heart sounds: Normal heart sounds.  Pulmonary:     Effort: Pulmonary effort is normal.     Breath sounds: Normal breath sounds.  Abdominal:     General: Bowel sounds are normal.     Palpations: Abdomen is soft.  Musculoskeletal:        General: Normal range of motion.     Right shoulder: Normal.     Left shoulder: Normal.     Right upper arm: Normal.     Left upper arm: Normal.     Right elbow: Normal.     Left elbow: Normal.     Right forearm: Normal.     Left forearm: Normal.     Right wrist: Normal.     Left wrist: Normal.     Right hand: Normal.     Left hand: Normal.     Cervical back: Normal, normal range of motion and neck supple.     Thoracic back: Normal.     Lumbar back: Normal.     Right hip: Normal.     Left hip: Normal.     Right upper leg: Normal.     Left upper leg: Normal.     Right knee: Normal.     Left knee: Normal.     Right lower leg: Normal.     Left lower leg: Normal.     Right ankle: Normal.     Left ankle: Normal.     Right foot: Normal.     Left foot: Normal.  Neurological:     General: No focal deficit present.     Mental Status: She is alert and  oriented to person, place, and time.  Psychiatric:        Mood and Affect: Mood normal.        Behavior: Behavior normal.    ASSESSMENT AND PLAN: 1. Spotting (Primary) - Routine screening.  - Keep all scheduled appointments with Gynecology.  - Follow-up with primary provider as scheduled.  - POCT urine pregnancy; Future - POCT URINALYSIS DIP (CLINITEK); Future - Cervicovaginal ancillary only - hCG, serum, qualitative - CMP14+EGFR    Patient was given the opportunity to ask questions.  Patient verbalized understanding of the plan and was able to repeat key elements of the plan. Patient was given clear instructions to go to Emergency Department or return to medical center if symptoms don't improve, worsen, or new problems develop.The patient verbalized understanding.   Orders Placed This Encounter  Procedures   hCG, serum, qualitative   CMP14+EGFR   POCT urine pregnancy   POCT URINALYSIS DIP (CLINITEK)    Follow-up with primary provider as scheduled.  Rema Fendt, NP

## 2023-08-21 NOTE — Progress Notes (Signed)
 Patient states she has been having motion sickness.  Lower abdominal soreness, tinder breast, lower back pain.

## 2023-08-22 ENCOUNTER — Other Ambulatory Visit: Payer: Self-pay | Admitting: Family

## 2023-08-22 DIAGNOSIS — B3731 Acute candidiasis of vulva and vagina: Secondary | ICD-10-CM

## 2023-08-22 DIAGNOSIS — B9689 Other specified bacterial agents as the cause of diseases classified elsewhere: Secondary | ICD-10-CM

## 2023-08-22 LAB — CMP14+EGFR
ALT: 10 IU/L (ref 0–32)
AST: 16 IU/L (ref 0–40)
Albumin: 4.2 g/dL (ref 3.9–4.9)
Alkaline Phosphatase: 57 IU/L (ref 44–121)
BUN/Creatinine Ratio: 12 (ref 9–23)
BUN: 10 mg/dL (ref 6–20)
Bilirubin Total: 0.6 mg/dL (ref 0.0–1.2)
CO2: 21 mmol/L (ref 20–29)
Calcium: 9.4 mg/dL (ref 8.7–10.2)
Chloride: 106 mmol/L (ref 96–106)
Creatinine, Ser: 0.84 mg/dL (ref 0.57–1.00)
Globulin, Total: 2.6 g/dL (ref 1.5–4.5)
Glucose: 94 mg/dL (ref 70–99)
Potassium: 4.4 mmol/L (ref 3.5–5.2)
Sodium: 142 mmol/L (ref 134–144)
Total Protein: 6.8 g/dL (ref 6.0–8.5)
eGFR: 93 mL/min/{1.73_m2} (ref >59)

## 2023-08-22 LAB — HCG, SERUM, QUALITATIVE: hCG,Beta Subunit,Qual,Serum: NEGATIVE m[IU]/mL (ref <6)

## 2023-08-22 MED ORDER — FLUCONAZOLE 150 MG PO TABS
150.0000 mg | ORAL_TABLET | ORAL | 0 refills | Status: AC
Start: 2023-08-22 — End: 2023-08-29

## 2023-08-22 MED ORDER — METRONIDAZOLE 500 MG PO TABS
500.0000 mg | ORAL_TABLET | Freq: Two times a day (BID) | ORAL | 0 refills | Status: AC
Start: 2023-08-22 — End: 2023-08-29

## 2023-08-23 ENCOUNTER — Other Ambulatory Visit: Payer: Self-pay | Admitting: Family

## 2023-08-23 DIAGNOSIS — M549 Dorsalgia, unspecified: Secondary | ICD-10-CM

## 2023-08-23 NOTE — Telephone Encounter (Signed)
-   Bacterial vaginitis and candida vaginitis symptom presentation variable. - Referral to Orthopedics for evaluation/management of back pain. Expect call soon with appointment details.

## 2023-08-28 ENCOUNTER — Ambulatory Visit (INDEPENDENT_AMBULATORY_CARE_PROVIDER_SITE_OTHER): Admitting: Family

## 2023-08-28 VITALS — BP 111/77 | HR 86 | Temp 98.4°F | Ht 66.0 in | Wt 174.8 lb

## 2023-08-28 DIAGNOSIS — Z13220 Encounter for screening for lipoid disorders: Secondary | ICD-10-CM

## 2023-08-28 DIAGNOSIS — Z6828 Body mass index (BMI) 28.0-28.9, adult: Secondary | ICD-10-CM

## 2023-08-28 DIAGNOSIS — E669 Obesity, unspecified: Secondary | ICD-10-CM | POA: Diagnosis not present

## 2023-08-28 DIAGNOSIS — Z7689 Persons encountering health services in other specified circumstances: Secondary | ICD-10-CM | POA: Diagnosis not present

## 2023-08-28 DIAGNOSIS — Z712 Person consulting for explanation of examination or test findings: Secondary | ICD-10-CM

## 2023-08-28 MED ORDER — PHENTERMINE HCL 37.5 MG PO CAPS: 37.5000 mg | ORAL_CAPSULE | ORAL | 0 refills | Status: DC

## 2023-08-28 NOTE — Progress Notes (Signed)
 Patient states  she doesn't think there is anything she wants to discuss.

## 2023-08-28 NOTE — Progress Notes (Signed)
 Patient ID: Debra Pruitt, female    DOB: 12-30-1990  MRN: [id number]  CC: Follow-Up  Subjective: Debra Pruitt is a 33 y.o. female who presents for follow-up.   Her concerns today include:  - Doing well on Phentermine, no issues/concerns.  - States has questions about positive bacterial vaginitis results. States concerned if possibly she got from her husband. I discussed with patient in detail. Patient verbalized understanding.  Reports she is doing well on medication regimen, no issues/concerns. - Cholesterol screening. - Upcoming appointment with Orthopedics for back pain.   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   Melanocytic nevus of left lower extremity 08/30/2021   Localization-related idiopathic epilepsy and epileptic syndromes with seizures of localized onset, not intractable, without status epilepticus (HCC) 07/28/2021   Migraine headache without aura 07/28/2021   Mild persistent asthma 07/28/2021   Food intolerance 03/10/2021   Cervical radiculopathy 05/31/2020   Vitamin D deficiency 01/09/2020     Current Outpatient Medications on File Prior to Visit  Medication Sig Dispense Refill   albuterol (VENTOLIN HFA) 108 (90 Base) MCG/ACT inhaler Inhale 1-2 puffs into the lungs every 6 (six) hours as needed. 1 each 0   fluconazole (DIFLUCAN) 150 MG tablet Take 1 tablet (150 mg total) by mouth every 3 (three) days for 3 doses. 3 tablet 0   FLUoxetine (PROZAC) 10 MG tablet TAKE 1 TABLET BY MOUTH EVERY DAY 90 tablet 1   hydrOXYzine (VISTARIL) 25 MG capsule Take 1 capsule (25 mg total) by mouth every 8 (eight) hours as needed. 30 capsule 1   linaclotide (LINZESS) 145 MCG CAPS capsule Take 1 capsule (145 mcg total) by mouth daily before breakfast. 90 capsule 1   metroNIDAZOLE (FLAGYL) 500 MG tablet Take 1 tablet (500 mg total) by mouth 2 (two) times daily for 7  days. 14 tablet 0   albuterol (VENTOLIN HFA) 108 (90 Base) MCG/ACT inhaler Inhale 2 puffs into the lungs every 6 (six) hours as needed for wheezing or shortness of breath. (Patient not taking: Reported on 07/31/2023) 18 g 2   FLUoxetine (PROZAC) 10 MG tablet Take 1 tablet (10 mg total) by mouth daily. (Patient not taking: Reported on 07/31/2023) 90 tablet 0   polycarbophil (FIBERCON) 625 MG tablet Take 625 mg by mouth daily. (Patient not taking: Reported on 08/28/2023)     polyethylene glycol (MIRALAX / GLYCOLAX) 17 g packet Take 17 g by mouth daily. (Patient not taking: Reported on 07/31/2023)     predniSONE (DELTASONE) 20 MG tablet Take 1 tablet (20 mg total) by mouth daily with breakfast. (Patient not taking: Reported on 08/28/2023) 5 tablet 0   No current facility-administered medications on file prior to visit.    Allergies  Allergen Reactions   Mangifera Indica Swelling   Mango Flavoring Agent (Non-Screening) Swelling   Pineapple Swelling   Other Hives, Itching and Rash    Goli Nutrition supplement    Social History   Socioeconomic History   Marital status: Married    Spouse name: Not on file   Number of children: 2   Years of education: Not on file   Highest education level: Some college, no degree  Occupational History   Occupation: Theatre manager   Occupation: Interior and spatial designer  Tobacco Use   Smoking status: Never   Smokeless tobacco: Never  Vaping Use   Vaping status: Never Used  Substance and  Sexual Activity   Alcohol use: Yes    Alcohol/week: 0.0 standard drinks of alcohol    Comment: Rare - social   Drug use: No   Sexual activity: Yes    Birth control/protection: None  Other Topics Concern   Not on file  Social History Narrative   Not on file   Social Drivers of Health   Financial Resource Strain: Low Risk  (06/21/2023)   Overall Financial Resource Strain (CARDIA)    Difficulty of Paying Living Expenses: Not hard at all  Food Insecurity: No Food Insecurity  (06/21/2023)   Hunger Vital Sign    Worried About Running Out of Food in the Last Year: Never true    Ran Out of Food in the Last Year: Never true  Transportation Needs: No Transportation Needs (06/21/2023)   PRAPARE - Administrator, Civil Service (Medical): No    Lack of Transportation (Non-Medical): No  Physical Activity: Insufficiently Active (06/21/2023)   Exercise Vital Sign    Days of Exercise per Week: 3 days    Minutes of Exercise per Session: 20 min  Stress: No Stress Concern Present (06/21/2023)   [employer] of Occupational Health - Occupational Stress Questionnaire    Feeling of Stress : Only a little  Social Connections: Moderately Integrated (06/21/2023)   Social Connection and Isolation Panel [NHANES]    Frequency of Communication with Friends and Family: More than three times a week    Frequency of Social Gatherings with Friends and Family: Once a week    Attends Religious Services: 1 to 4 times per year    Active Member of [employer] or Organizations: No    Attends Engineer, structural: Not on file    Marital Status: Married  Catering manager Violence: Not on file    Family History  Problem Relation Age of Onset   Hypertension Mother    Thyroid disease Mother    HIV Father    Cirrhosis Father    Breast cancer Maternal Aunt    Breast cancer Maternal Grandmother    Asthma Maternal Grandmother    Hypertension Maternal Grandfather    Heart disease Maternal Grandfather    Diabetes Maternal Grandfather    Hypertension Paternal Grandmother    COPD Paternal Grandmother    Heart disease Paternal Grandmother    Hyperlipidemia Paternal Grandmother    Stroke Paternal Grandmother    Diabetes Paternal Grandmother    Hepatitis C Paternal Grandmother    Rheum arthritis Paternal Grandmother    Colon cancer Neg Hx    Esophageal cancer Neg Hx    Stomach cancer Neg Hx     Past Surgical History:  Procedure Laterality Date   CESAREAN SECTION   04/30/2013   x 2   MULTIPLE TOOTH EXTRACTIONS     SALPINGECTOMY Right    for ectopic pregnancy    ROS: Review of Systems Negative except as stated above  PHYSICAL EXAM: BP 111/77   Pulse 86   Temp 98.4 F (36.9 C) (Oral)   Ht 5\' 6"  (1.676 m)   Wt 174 lb 12.8 oz (79.3 kg)   LMP 07/25/2023 (Exact Date)   SpO2 98%   BMI 28.21 kg/m   Wt Readings from Last 3 Encounters:  08/28/23 174 lb 12.8 oz (79.3 kg)  08/21/23 172 lb 12.8 oz (78.4 kg)  07/31/23 173 lb 12.8 oz (78.8 kg)   Physical Exam HENT:     Head: Normocephalic and atraumatic.     Nose:  Nose normal.     Mouth/Throat:     Mouth: Mucous membranes are moist.     Pharynx: Oropharynx is clear.  Eyes:     Extraocular Movements: Extraocular movements intact.     Conjunctiva/sclera: Conjunctivae normal.     Pupils: Pupils are equal, round, and reactive to light.  Cardiovascular:     Rate and Rhythm: Normal rate and regular rhythm.     Pulses: Normal pulses.     Heart sounds: Normal heart sounds.  Pulmonary:     Effort: Pulmonary effort is normal.     Breath sounds: Normal breath sounds.  Musculoskeletal:        General: Normal range of motion.     Cervical back: Normal range of motion and neck supple.  Neurological:     General: No focal deficit present.     Mental Status: She is alert and oriented to person, place, and time.  Psychiatric:        Mood and Affect: Mood normal.        Behavior: Behavior normal.     ASSESSMENT AND PLAN: 1. Encounter for weight management (Primary) 2. BMI 28.0-28.9,adult - Increase Phentermine from 30 mg to 37.5 mg as prescribed. Counseled on medication adherence/adverse effects.  - Follow-up with primary provider in 4 weeks or sooner if needed.  - phentermine 37.5 MG capsule; Take 1 capsule (37.5 mg total) by mouth every morning.  Dispense: 30 capsule; Refill: 0  3. Screening cholesterol level - Routine screening.  - Lipid panel  4. Encounter to discuss test results -  Patient states she has questions about positive bacterial vaginitis results. Patient states she is concerned if she possibly she got from her husband. I discussed with patient in detail. Patient verbalized understanding.     Patient was given the opportunity to ask questions.  Patient verbalized understanding of the plan and was able to repeat key elements of the plan. Patient was given clear instructions to go to Emergency Department or return to medical center if symptoms don't improve, worsen, or new problems develop.The patient verbalized understanding.   Requested Prescriptions   Signed Prescriptions Disp Refills   phentermine 37.5 MG capsule 30 capsule 0    Sig: Take 1 capsule (37.5 mg total) by mouth every morning.    Return in about 4 weeks (around 09/25/2023) for Follow-Up or next available weight check.  Rema Fendt, NP

## 2023-08-29 ENCOUNTER — Encounter: Payer: Self-pay | Admitting: Family

## 2023-08-29 LAB — LIPID PANEL
Chol/HDL Ratio: 2.2 ratio (ref 0.0–4.4)
Cholesterol, Total: 155 mg/dL (ref 100–199)
HDL: 69 mg/dL (ref >39)
LDL Chol Calc (NIH): 71 mg/dL (ref 0–99)
Triglycerides: 78 mg/dL (ref 0–149)
VLDL Cholesterol Cal: 15 mg/dL (ref 5–40)

## 2023-09-05 ENCOUNTER — Ambulatory Visit (INDEPENDENT_AMBULATORY_CARE_PROVIDER_SITE_OTHER): Admitting: Physical Medicine and Rehabilitation

## 2023-09-05 ENCOUNTER — Encounter: Payer: Self-pay | Admitting: Physical Medicine and Rehabilitation

## 2023-09-05 DIAGNOSIS — M47819 Spondylosis without myelopathy or radiculopathy, site unspecified: Secondary | ICD-10-CM | POA: Diagnosis not present

## 2023-09-05 DIAGNOSIS — M545 Low back pain, unspecified: Secondary | ICD-10-CM | POA: Diagnosis not present

## 2023-09-05 DIAGNOSIS — G8929 Other chronic pain: Secondary | ICD-10-CM | POA: Diagnosis not present

## 2023-09-05 MED ORDER — TRIAZOLAM 0.25 MG PO TABS: ORAL_TABLET | ORAL | 0 refills | Status: DC

## 2023-09-05 MED ORDER — MELOXICAM 15 MG PO TABS: 15.0000 mg | ORAL_TABLET | Freq: Every day | ORAL | 0 refills | Status: DC

## 2023-09-05 NOTE — Progress Notes (Signed)
 Debra Pruitt - 33 y.o. female MRN [id number]  Date of birth: 28-Mar-1991  Office Visit Note: Visit Date: 09/05/2023 PCP: Rema Fendt, NP Referred by: Rema Fendt, NP  Subjective: Chief Complaint  Patient presents with   Lower Back - Pain   HPI: Debra Pruitt is a 33 y.o. female who comes in today per the request of Ricky Stabs, NP for evaluation of chronic, worsening and severe bilateral lower back pain. Pain ongoing for the last year. She reports history of "sciatica" flare up after birth of her daughter last year that did resolve over time. Her pain worsens with prolonged standing and performing household tasks such as cooking and cleaning. Moving from sitting to standing position is also painful. She describes her pain as sore and aching sensation, currently rates as 5 out of 10. Some relief of pain with home exercise regimen, rest and use of medications. History of formal physical therapy for more muscle weakness and posture. She has tried muscle relaxer medications in the past with minimal relief of pain. She was previously evaluated by Dr. Levert Feinstein with [facility], lumbar MRI imaging from 2016 is normal. Patient denies focal weakness, numbness and tingling. No recent trauma or falls.      Review of Systems  Musculoskeletal:  Positive for back pain and myalgias.  Neurological:  Negative for tingling, sensory change, focal weakness and weakness.  All other systems reviewed and are negative.  Otherwise per HPI.  Assessment & Plan: Visit Diagnoses:    ICD-10-CM   1. Chronic bilateral low back pain without sciatica  M54.50 MR LUMBAR SPINE WO CONTRAST   G89.29     2. Spondylosis without myelopathy or radiculopathy  M47.819        Plan: Findings:  Chronic, worsening and severe bilateral axial back pain. No radicular symptoms down the legs. Patient continues to have severe pain despite good conservative therapies such as home exercise regimen, rest and use of  medications. Patients clinical presentation and exam are consistent with facet mediated pain. We discussed treatment plan in detail today. Next step is to obtain lumbar MRI imaging. Depending on results of MRI imaging we discussed possibility of performing lumbar injections. I also discussed medication management and prescribed Meloxicam for her to try. She did voice issues with anxiety and claustrophobia related to MRI imaging, I prescribed pre-procedure Halcion for her to take on day of imaging. I do feel she would benefit from short course of formal physical therapy, we will hold on PT until after imaging. Patient has no questions at this time. No red flag symptoms noted upon exam today.     Meds & Orders:  Meds ordered this encounter  Medications   meloxicam (MOBIC) 15 MG tablet    Sig: Take 1 tablet (15 mg total) by mouth daily.    Dispense:  30 tablet    Refill:  0   triazolam (HALCION) 0.25 MG tablet    Sig: Take one tablet (0.25 mg) by mouth with food one hour prior to procedure.    Dispense:  2 tablet    Refill:  0    Orders Placed This Encounter  Procedures   MR LUMBAR SPINE WO CONTRAST    Follow-up: Return for Lumbar MRI review.   Procedures: No procedures performed      Clinical History: No specialty comments available.   She reports that she has never smoked. She has never used smokeless tobacco.  Recent Labs    07/31/23 1011  HGBA1C 5.3    Objective:  VS:  HT:    WT:   BMI:     BP:   HR: bpm  TEMP: ( )  RESP:  Physical Exam Vitals and nursing note reviewed.  HENT:     Head: Normocephalic and atraumatic.     Right Ear: External ear normal.     Left Ear: External ear normal.     Nose: Nose normal.     Mouth/Throat:     Mouth: Mucous membranes are moist.  Eyes:     Extraocular Movements: Extraocular movements intact.  Cardiovascular:     Rate and Rhythm: Normal rate.     Pulses: Normal pulses.  Pulmonary:     Effort: Pulmonary effort is normal.   Abdominal:     General: Abdomen is flat. There is no distension.  Musculoskeletal:        General: Tenderness present.     Cervical back: Normal range of motion.     Comments: Patient rises from seated position to standing without difficulty. Pain noted with facet loading and lumbar extension. 5/5 strength noted with bilateral hip flexion, knee flexion/extension, ankle dorsiflexion/plantarflexion and EHL. No clonus noted bilaterally. No pain upon palpation of greater trochanters. No pain with internal/external rotation of bilateral hips. Sensation intact bilaterally. Negative slump test bilaterally. Ambulates without aid, gait steady.     Skin:    General: Skin is warm and dry.     Capillary Refill: Capillary refill takes less than 2 seconds.  Neurological:     General: No focal deficit present.     Mental Status: She is alert and oriented to person, place, and time.  Psychiatric:        Mood and Affect: Mood normal.        Behavior: Behavior normal.     Ortho Exam  Imaging: No results found.  Past Medical/Family/Surgical/Social History: Medications & Allergies reviewed per EMR, new medications updated. 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   Melanocytic nevus of left lower extremity 08/30/2021   Localization-related idiopathic epilepsy and epileptic syndromes with seizures of localized onset, not intractable, without status epilepticus (HCC) 07/28/2021   Migraine headache without aura 07/28/2021   Mild persistent asthma 07/28/2021   Food intolerance 03/10/2021   Cervical radiculopathy 05/31/2020   Vitamin D deficiency 01/09/2020   Past Medical History:  Diagnosis Date   Anemia    Angio-edema    Anxiety    Bacterial vaginosis    Low back pain    Mild persistent asthma 07/28/2021   Ovarian cyst    Peripheral neuropathy    Urticaria    Vitamin D deficiency     Family History  Problem Relation Age of Onset   Hypertension Mother    Thyroid disease Mother    HIV Father    Cirrhosis Father    Breast cancer Maternal Aunt    Breast cancer Maternal Grandmother    Asthma Maternal Grandmother    Hypertension Maternal Grandfather    Heart disease Maternal Grandfather    Diabetes Maternal Grandfather    Hypertension Paternal Grandmother    COPD Paternal Grandmother    Heart disease Paternal Grandmother    Hyperlipidemia Paternal Grandmother    Stroke Paternal Grandmother    Diabetes Paternal Grandmother    Hepatitis C Paternal Grandmother    Rheum arthritis Paternal Grandmother    Colon cancer  Neg Hx    Esophageal cancer Neg Hx    Stomach cancer Neg Hx    Past Surgical History:  Procedure Laterality Date   CESAREAN SECTION  04/30/2013   x 2   MULTIPLE TOOTH EXTRACTIONS     SALPINGECTOMY Right    for ectopic pregnancy   Social History   Occupational History   Occupation: Theatre manager   Occupation: Interior and spatial designer  Tobacco Use   Smoking status: Never   Smokeless tobacco: Never  Vaping Use   Vaping status: Never Used  Substance and Sexual Activity   Alcohol use: Yes    Alcohol/week: 0.0 standard drinks of alcohol    Comment: Rare - social   Drug use: No   Sexual activity: Yes    Birth control/protection: None

## 2023-09-05 NOTE — Progress Notes (Signed)
 Pain Scale   Average Pain 4 Patient advising her lower back pain increases whe she walks and stand for long periods of time. She advises that sitting and resting does help ease pain. Patient advises muscle relaxer's make her drowsy.Patient also advises that Pain medication puts her to sleep.        +Driver, -BT, -Dye Allergies.

## 2023-09-15 ENCOUNTER — Encounter: Payer: Self-pay | Admitting: Physical Medicine and Rehabilitation

## 2023-09-19 ENCOUNTER — Other Ambulatory Visit

## 2023-09-27 ENCOUNTER — Other Ambulatory Visit: Payer: Self-pay | Admitting: Physical Medicine and Rehabilitation

## 2023-09-27 DIAGNOSIS — M47819 Spondylosis without myelopathy or radiculopathy, site unspecified: Secondary | ICD-10-CM

## 2023-09-27 DIAGNOSIS — M545 Low back pain, unspecified: Secondary | ICD-10-CM

## 2023-10-04 ENCOUNTER — Ambulatory Visit: Admitting: Obstetrics and Gynecology

## 2023-10-04 ENCOUNTER — Encounter: Payer: Self-pay | Admitting: Obstetrics and Gynecology

## 2023-10-04 ENCOUNTER — Other Ambulatory Visit: Payer: Self-pay

## 2023-10-04 VITALS — BP 111/76 | HR 73 | Wt 172.0 lb

## 2023-10-04 DIAGNOSIS — N809 Endometriosis, unspecified: Secondary | ICD-10-CM | POA: Diagnosis not present

## 2023-10-04 DIAGNOSIS — Z1331 Encounter for screening for depression: Secondary | ICD-10-CM

## 2023-10-04 MED ORDER — NORETHINDRONE ACETATE 5 MG PO TABS: 5.0000 mg | ORAL_TABLET | Freq: Every day | ORAL | 2 refills | Status: DC

## 2023-10-04 NOTE — Progress Notes (Signed)
 GYNECOLOGY VISIT  Patient name: Debra Pruitt MRN [id number]  Date of birth: 04-Jan-1991 Chief Complaint:   Endometriosis (/)  History:  Debra Pruitt is a 33 y.o. [id number] being seen today for endometriosis.    Pain noticed in 2013 -0 had 2017 ectopic pregnancy with some excision since then. No US  isnce tha timte. Having pain during her menses and shapr pain- cramping pain and will radiate down to her legs. Up tp 2 day sof pain. Taking NSAIDs and APA with some relif. Wants to know treatment option; does not want hysterectomy. No intermenstrual bleeding. Q 30d cycle typically, moderate flow. 4-5 dy up to 7d bleeding   Seen 11/2021 by PD: hx noted for endometriosis like implants at time of salpingectomy for ectopic pregnancy. Started on myfembree  at that time.    Discussed the use of AI scribe software for clinical note transcription with the patient, who gave verbal consent to proceed.  History of Present Illness Debra Pruitt "Ruddy Corral" is a 33 year old female with endometriosis who presents with worsening menstrual pain.  She has experienced persistent and worsening menstrual pain since it was first confirmed in February 2017. The pain is most severe during the first two days of her periods, which she describes as 'horrible.' She has a history of endometriosis, initially suspected due to painful periods and confirmed during a procedure. She underwent a scraping procedure in 2017 for endometriosis.  After her last pregnancy, she started using Depo-Provera, which effectively stopped her periods but resulted in weight gain. She later discontinued Depo-Provera and experienced symptoms mimicking pregnancy, attributed to coming off the medication. She has one fallopian tube tied following a C-section seven years ago, which should prevent pregnancy.  She has used norethindrone in the past to stop her periods during vacations, which was effective until she stopped the medication, after which her  periods became 'horrible.' She took norethindrone three times a day during those times. She has not tried medications like Myfembree  or Orilissa .  Pain is primarily during her periods, with occasional pain during intercourse, suspected to be related to the timing of her menstrual cycle. She has undergone pelvic floor physical therapy in the past due to IBS and Linzess  use.  No pain with urination or defecation outside of her periods. Her periods are accompanied by significant pain and sometimes large blood clots, but the bleeding itself was not as severe in the last cycle.    Past Medical History:  Diagnosis Date   Anemia    Angio-edema    Anxiety    Bacterial vaginosis    Low back pain    Mild persistent asthma 07/28/2021   Ovarian cyst    Peripheral neuropathy    Urticaria    Vitamin D  deficiency     Past Surgical History:  Procedure Laterality Date   CESAREAN SECTION  04/30/2013   x 2   MULTIPLE TOOTH EXTRACTIONS     SALPINGECTOMY Right    for ectopic pregnancy    The following portions of the patient's history were reviewed and updated as appropriate: allergies, current medications, past family history, past medical history, past social history, past surgical history and problem list.   Health Maintenance:   Last pap     Component Value Date/Time   DIAGPAP  12/19/2021 1121    - Negative for intraepithelial lesion or malignancy (NILM)   HPVHIGH Negative 12/19/2021 1121   HPVHIGH Negative 05/26/2020 1208   ADEQPAP  12/19/2021 1121    Satisfactory for  evaluation; transformation zone component PRESENT.    High Risk HPV: Positive  Adequacy:  Satisfactory for evaluation, transformation zone component PRESENT  Diagnosis:  Atypical squamous cells of undetermined significance (ASC-US )  Last mammogram: N/A   Review of Systems:  Pertinent items are noted in HPI. Comprehensive review of systems was otherwise negative.   Objective:  Physical Exam BP 111/76   Pulse 73    Wt 172 lb (78 kg)   LMP 09/24/2023 (Exact Date)   BMI 27.76 kg/m    Physical Exam Vitals and nursing note reviewed.  Constitutional:      Appearance: Normal appearance.  HENT:     Head: Normocephalic and atraumatic.  Pulmonary:     Effort: Pulmonary effort is normal.  Skin:    General: Skin is warm and dry.  Neurological:     General: No focal deficit present.     Mental Status: She is alert.  Psychiatric:        Mood and Affect: Mood normal.        Behavior: Behavior normal.        Thought Content: Thought content normal.        Judgment: Judgment normal.       Assessment & Plan:   Assessment & Plan Endometriosis Chronic endometriosis with persistent dysmenorrhea. Previous treatments included Depo-Provera and norethindrone. Hormonal suppression discussed. Surgical options considered but not preferred due to potential ineffectiveness without oophorectomy. - Prescribe norethindrone once daily. Increase to three times daily if needed. - Order pelvic ultrasound to evaluate for fibroids or polyps. - Follow up in 2-3 months to assess response and adjust dosage if symptoms persist.  Dysmenorrhea Severe dysmenorrhea associated with endometriosis. Norethindrone previously effective. - Manage with norethindrone as outlined in endometriosis plan. - Evaluate response at follow-up.  Tubal ligation Tubal ligation performed during C-section seven years ago. Low likelihood of recanalization and pregnancy. Explained low likelihood of recanalization. - Reassure regarding effectiveness of tubal ligation. - Consider HSG if future concerns about tubal patency arise.   Routine preventative health maintenance measures emphasized.  Kiki Pelton, MD [facility] for [facility], [facility]

## 2023-10-08 ENCOUNTER — Other Ambulatory Visit: Payer: Self-pay | Admitting: Family

## 2023-10-08 DIAGNOSIS — Z6827 Body mass index (BMI) 27.0-27.9, adult: Secondary | ICD-10-CM

## 2023-10-08 DIAGNOSIS — Z7689 Persons encountering health services in other specified circumstances: Secondary | ICD-10-CM

## 2023-10-09 ENCOUNTER — Other Ambulatory Visit: Payer: Self-pay | Admitting: Family

## 2023-10-09 DIAGNOSIS — Z7689 Persons encountering health services in other specified circumstances: Secondary | ICD-10-CM

## 2023-10-09 DIAGNOSIS — Z6828 Body mass index (BMI) 28.0-28.9, adult: Secondary | ICD-10-CM

## 2023-10-09 NOTE — Telephone Encounter (Signed)
 Schedule appointment?

## 2023-10-15 ENCOUNTER — Ambulatory Visit (HOSPITAL_COMMUNITY)
Admission: RE | Admit: 2023-10-15 | Discharge: 2023-10-15 | Disposition: A | Discharge status: Home / Self Care | Source: Ambulatory Visit | Attending: Obstetrics and Gynecology | Admitting: Obstetrics and Gynecology

## 2023-10-15 DIAGNOSIS — N809 Endometriosis, unspecified: Secondary | ICD-10-CM | POA: Diagnosis not present

## 2023-10-22 ENCOUNTER — Encounter: Payer: Self-pay | Admitting: Obstetrics and Gynecology

## 2023-10-23 ENCOUNTER — Ambulatory Visit: Payer: Self-pay | Admitting: Obstetrics and Gynecology

## 2023-11-08 ENCOUNTER — Encounter: Admitting: Family

## 2023-11-08 NOTE — Progress Notes (Signed)
 Erroneous encounter-disregard

## 2023-11-20 ENCOUNTER — Encounter: Payer: Self-pay | Admitting: Obstetrics and Gynecology

## 2023-11-20 DIAGNOSIS — R232 Flushing: Secondary | ICD-10-CM

## 2023-12-03 ENCOUNTER — Other Ambulatory Visit

## 2023-12-04 ENCOUNTER — Other Ambulatory Visit: Payer: Self-pay

## 2023-12-04 ENCOUNTER — Other Ambulatory Visit

## 2023-12-04 DIAGNOSIS — R232 Flushing: Secondary | ICD-10-CM | POA: Diagnosis not present

## 2023-12-05 ENCOUNTER — Encounter: Payer: Self-pay | Admitting: Family

## 2023-12-05 ENCOUNTER — Ambulatory Visit (INDEPENDENT_AMBULATORY_CARE_PROVIDER_SITE_OTHER): Admitting: Family

## 2023-12-05 VITALS — BP 106/74 | HR 91 | Temp 99.4°F | Resp 16 | Ht 66.0 in | Wt 175.2 lb

## 2023-12-05 DIAGNOSIS — F411 Generalized anxiety disorder: Secondary | ICD-10-CM | POA: Diagnosis not present

## 2023-12-05 DIAGNOSIS — E669 Obesity, unspecified: Secondary | ICD-10-CM | POA: Diagnosis not present

## 2023-12-05 DIAGNOSIS — Z6828 Body mass index (BMI) 28.0-28.9, adult: Secondary | ICD-10-CM | POA: Diagnosis not present

## 2023-12-05 DIAGNOSIS — Z7689 Persons encountering health services in other specified circumstances: Secondary | ICD-10-CM

## 2023-12-05 LAB — TSH RFX ON ABNORMAL TO FREE T4: TSH: 0.984 u[IU]/mL (ref 0.450–4.500)

## 2023-12-05 LAB — HEMOGLOBIN A1C
Est. average glucose Bld gHb Est-mCnc: 105 mg/dL
Hgb A1c MFr Bld: 5.3 % (ref 4.8–5.6)

## 2023-12-05 LAB — FOLLICLE STIMULATING HORMONE: FSH: 5.4 m[IU]/mL

## 2023-12-05 LAB — ESTRADIOL: Estradiol: 15.4 pg/mL

## 2023-12-05 MED ORDER — PHENTERMINE HCL 37.5 MG PO CAPS: 37.5000 mg | ORAL_CAPSULE | ORAL | 0 refills | Status: DC

## 2023-12-05 MED ORDER — FLUOXETINE HCL 10 MG PO TABS: 10.0000 mg | ORAL_TABLET | Freq: Every day | ORAL | 0 refills | Status: DC

## 2023-12-05 NOTE — Progress Notes (Signed)
 Talk about the weight loss

## 2023-12-05 NOTE — Progress Notes (Signed)
 Patient ID: Debra Pruitt, female    DOB: Feb 07, 1991  MRN: [id number]  CC: Weight Check  Subjective: Debra Pruitt is a 33 y.o. female who presents for weight check.  Her concerns today include:  - States increased dose of Phentermine  causing her to stay awake at night. States she could improve diet. She does not exercise. - Doing well on Fluoxetine , no issues/concerns. She denies thoughts of self-harm, suicidal ideations, homicidal ideations. - Established with Gynecology.  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   Melanocytic nevus of left lower extremity 08/30/2021   Localization-related idiopathic epilepsy and epileptic syndromes with seizures of localized onset, not intractable, without status epilepticus (HCC) 07/28/2021   Migraine headache without aura 07/28/2021   Mild persistent asthma 07/28/2021   Food intolerance 03/10/2021   Cervical radiculopathy 05/31/2020   Vitamin D  deficiency 01/09/2020     Current Outpatient Medications on File Prior to Visit  Medication Sig Dispense Refill   albuterol  (VENTOLIN  HFA) 108 (90 Base) MCG/ACT inhaler Inhale 1-2 puffs into the lungs every 6 (six) hours as needed. 1 each 0   hydrOXYzine  (VISTARIL ) 25 MG capsule Take 1 capsule (25 mg total) by mouth every 8 (eight) hours as needed. 30 capsule 1   linaclotide  (LINZESS ) 145 MCG CAPS capsule Take 1 capsule (145 mcg total) by mouth daily before breakfast. 90 capsule 1   norethindrone  (AYGESTIN ) 5 MG tablet Take 1 tablet (5 mg total) by mouth daily. 30 tablet 2   albuterol  (VENTOLIN  HFA) 108 (90 Base) MCG/ACT inhaler Inhale 2 puffs into the lungs every 6 (six) hours as needed for wheezing or shortness of breath. (Patient not taking: Reported on 10/04/2023) 18 g 2   meloxicam  (MOBIC ) 15 MG tablet Take 1 tablet (15 mg total) by mouth daily. (Patient not taking: Reported on  10/04/2023) 30 tablet 0   polycarbophil (FIBERCON) 625 MG tablet Take 625 mg by mouth daily. (Patient not taking: Reported on 07/02/2023)     polyethylene glycol (MIRALAX / GLYCOLAX) 17 g packet Take 17 g by mouth daily. (Patient not taking: Reported on 10/04/2023)     predniSONE  (DELTASONE ) 20 MG tablet Take 1 tablet (20 mg total) by mouth daily with breakfast. (Patient not taking: Reported on 07/02/2023) 5 tablet 0   No current facility-administered medications on file prior to visit.    Allergies  Allergen Reactions   Ashwagandha-Rhodiola Hives   Mangifera Indica Swelling   Mango Flavoring Agent (Non-Screening) Swelling   Pineapple Swelling   Other Hives, Itching and Rash    Goli Nutrition supplement    Social History   Socioeconomic History   Marital status: Married    Spouse name: Not on file   Number of children: 2   Years of education: Not on file   Highest education level: Some college, no degree  Occupational History   Occupation: Theatre manager   Occupation: Interior and spatial designer  Tobacco Use   Smoking status: Never   Smokeless tobacco: Never  Vaping Use   Vaping status: Never Used  Substance and Sexual Activity   Alcohol use: Yes    Alcohol/week: 0.0 standard drinks of alcohol    Comment: Rare - social   Drug use: No   Sexual activity: Yes    Birth control/protection: None  Other Topics Concern   Not on file  Social History Narrative   Not on file  Social Drivers of Corporate investment banker Strain: Low Risk  (06/21/2023)   Overall Financial Resource Strain (CARDIA)    Difficulty of Paying Living Expenses: Not hard at all  Food Insecurity: No Food Insecurity (10/04/2023)   Hunger Vital Sign    Worried About Running Out of Food in the Last Year: Never true    Ran Out of Food in the Last Year: Never true  Transportation Needs: No Transportation Needs (10/04/2023)   PRAPARE - Administrator, Civil Service (Medical): No    Lack of Transportation  (Non-Medical): No  Physical Activity: Insufficiently Active (06/21/2023)   Exercise Vital Sign    Days of Exercise per Week: 3 days    Minutes of Exercise per Session: 20 min  Stress: No Stress Concern Present (06/21/2023)   [employer] of Occupational Health - Occupational Stress Questionnaire    Feeling of Stress : Only a little  Social Connections: Moderately Integrated (06/21/2023)   Social Connection and Isolation Panel    Frequency of Communication with Friends and Family: More than three times a week    Frequency of Social Gatherings with Friends and Family: Once a week    Attends Religious Services: 1 to 4 times per year    Active Member of [employer] or Organizations: No    Attends Engineer, structural: Not on file    Marital Status: Married  Catering manager Violence: Not At Risk (12/05/2023)   Humiliation, Afraid, Rape, and Kick questionnaire    Fear of Current or Ex-Partner: No    Emotionally Abused: No    Physically Abused: No    Sexually Abused: No    Family History  Problem Relation Age of Onset   Hypertension Mother    Thyroid  disease Mother    HIV Father    Cirrhosis Father    Breast cancer Maternal Aunt    Breast cancer Maternal Grandmother    Asthma Maternal Grandmother    Hypertension Maternal Grandfather    Heart disease Maternal Grandfather    Diabetes Maternal Grandfather    Hypertension Paternal Grandmother    COPD Paternal Grandmother    Heart disease Paternal Grandmother    Hyperlipidemia Paternal Grandmother    Stroke Paternal Grandmother    Diabetes Paternal Grandmother    Hepatitis C Paternal Grandmother    Rheum arthritis Paternal Grandmother    Colon cancer Neg Hx    Esophageal cancer Neg Hx    Stomach cancer Neg Hx     Past Surgical History:  Procedure Laterality Date   CESAREAN SECTION  04/30/2013   x 2   MULTIPLE TOOTH EXTRACTIONS     SALPINGECTOMY Right    for ectopic pregnancy    ROS: Review of Systems Negative  except as stated above  PHYSICAL EXAM: BP 106/74   Pulse 91   Temp 99.4 F (37.4 C) (Oral)   Resp 16   Ht 5' 6 (1.676 m)   Wt 175 lb 3.2 oz (79.5 kg)   LMP 09/24/2023 (Exact Date)   SpO2 97%   BMI 28.28 kg/m   Wt Readings from Last 3 Encounters:  12/05/23 175 lb 3.2 oz (79.5 kg)  10/04/23 172 lb (78 kg)  08/28/23 174 lb 12.8 oz (79.3 kg)    Physical Exam HENT:     Head: Normocephalic and atraumatic.     Nose: Nose normal.     Mouth/Throat:     Mouth: Mucous membranes are moist.     Pharynx:  Oropharynx is clear.  Eyes:     Extraocular Movements: Extraocular movements intact.     Conjunctiva/sclera: Conjunctivae normal.     Pupils: Pupils are equal, round, and reactive to light.  Cardiovascular:     Rate and Rhythm: Normal rate and regular rhythm.     Pulses: Normal pulses.     Heart sounds: Normal heart sounds.  Pulmonary:     Effort: Pulmonary effort is normal.     Breath sounds: Normal breath sounds.  Musculoskeletal:        General: Normal range of motion.     Cervical back: Normal range of motion and neck supple.  Neurological:     General: No focal deficit present.     Mental Status: She is alert and oriented to person, place, and time.  Psychiatric:        Mood and Affect: Mood normal.        Behavior: Behavior normal.      ASSESSMENT AND PLAN: 1. Encounter for weight management (Primary) 2. BMI 28.0-28.9,adult - Patient gained 3 pounds since previous office visit.  - Decrease Phentermine  from 37.5 mg to 30 mg with goal of improving sleep during nighttime. Counseled on medication adherence/adverse effects.  - I did check the [place]  prescription drug database.  - Follow-up with primary provider in 4 weeks or sooner if needed.  - phentermine  37.5 MG capsule; Take 1 capsule (37.5 mg total) by mouth every morning.  Dispense: 30 capsule; Refill: 0  3. Generalized anxiety disorder - Patient denies thoughts of self-harm, suicidal ideations,  homicidal ideations. - Continue Fluoxetine  as prescribed. Counseled on medication adherence/adverse effects.  - Follow-up with primary provider in 3 months or sooner if needed.  - FLUoxetine  (PROZAC ) 10 MG tablet; Take 1 tablet (10 mg total) by mouth daily.  Dispense: 90 tablet; Refill: 0   Patient was given the opportunity to ask questions.  Patient verbalized understanding of the plan and was able to repeat key elements of the plan. Patient was given clear instructions to go to Emergency Department or return to medical center if symptoms don't improve, worsen, or new problems develop.The patient verbalized understanding.   Requested Prescriptions   Signed Prescriptions Disp Refills   phentermine  37.5 MG capsule 30 capsule 0    Sig: Take 1 capsule (37.5 mg total) by mouth every morning.   FLUoxetine  (PROZAC ) 10 MG tablet 90 tablet 0    Sig: Take 1 tablet (10 mg total) by mouth daily.    Return in about 4 weeks (around 01/02/2024) for Follow-Up or next available weight check.  Greig JINNY Drones, NP

## 2023-12-07 ENCOUNTER — Ambulatory Visit: Payer: Self-pay | Admitting: Obstetrics and Gynecology

## 2023-12-11 ENCOUNTER — Encounter: Payer: Self-pay | Admitting: Obstetrics and Gynecology

## 2023-12-11 DIAGNOSIS — N939 Abnormal uterine and vaginal bleeding, unspecified: Secondary | ICD-10-CM

## 2023-12-13 ENCOUNTER — Other Ambulatory Visit (HOSPITAL_COMMUNITY)
Admission: RE | Admit: 2023-12-13 | Discharge: 2023-12-13 | Disposition: A | Discharge status: Home / Self Care | Source: Ambulatory Visit | Attending: Obstetrics and Gynecology | Admitting: Obstetrics and Gynecology

## 2023-12-13 DIAGNOSIS — N939 Abnormal uterine and vaginal bleeding, unspecified: Secondary | ICD-10-CM | POA: Diagnosis not present

## 2023-12-13 DIAGNOSIS — D281 Benign neoplasm of vagina: Secondary | ICD-10-CM | POA: Diagnosis not present

## 2023-12-13 NOTE — Telephone Encounter (Signed)
 This message encounter had been addressed and patient brought specimen to office 12/13/23.

## 2023-12-18 LAB — SURGICAL PATHOLOGY

## 2023-12-19 ENCOUNTER — Ambulatory Visit: Payer: Self-pay | Admitting: Obstetrics and Gynecology

## 2023-12-19 NOTE — Telephone Encounter (Signed)
 Confirmed name and DOB. Notes that it was a string of blood from it to her. She has seen the report and has questions. Noted that once it had passed it was not a blood clot. With first day of menses, did not have pain after missing a dose of her medication. Considering stopping medication now and seeing how her bleeding proceeds without. Also discussed the option of hysteroscopy and looking side the uterus to see if there are additional polyps/growth. Patient unsure if she would like to proceed with a procedure. Will assess bleeding for now and already has follow up scheduled and will discuss further at that time.  FINAL MICROSCOPIC DIAGNOSIS:  A. VAGINAL TISSUE, EXCISION:      Benign genital stromal tumor.      See comment.  COMMENT:  The specimen is composed of a cellular spindle cell proliferation with myxoedematous background. The lesion does not show sharp demarcation in the limited biopsy. The cells are spindle to epithelioid with occasional plasmacytoid morphology with eosinophilic cytoplasm. Abundant small vessels and occasional irregular vessels are seen throughout the lesion. There is no mitotic figures or necrosis identified. Immunohistochemical stains show the cells are positive for desmin, and are negative for SMA, CD34, ER and PR. The overall features are in keeping with a benign genital stomal tumor favor angiomyofibroblastoma. A fibroepithelial stromal polyp is essentially possible. There is no evidence of malignancy identified on current specimen.  Controls worked appropriately.  The case was peer-reviewed by Dr. Reed who agrees with the diagnosis and favors angiomyofibroblastoma.

## 2024-01-01 ENCOUNTER — Other Ambulatory Visit: Payer: Self-pay | Admitting: Family

## 2024-01-01 DIAGNOSIS — F411 Generalized anxiety disorder: Secondary | ICD-10-CM

## 2024-01-01 NOTE — Telephone Encounter (Signed)
 Complete

## 2024-01-05 ENCOUNTER — Other Ambulatory Visit: Payer: Self-pay | Admitting: Obstetrics and Gynecology

## 2024-01-05 DIAGNOSIS — N809 Endometriosis, unspecified: Secondary | ICD-10-CM

## 2024-01-10 ENCOUNTER — Encounter: Payer: Self-pay | Admitting: Family

## 2024-01-10 ENCOUNTER — Ambulatory Visit (INDEPENDENT_AMBULATORY_CARE_PROVIDER_SITE_OTHER): Admitting: Family

## 2024-01-10 VITALS — BP 112/78 | HR 74 | Temp 98.6°F | Resp 16 | Ht 66.0 in | Wt 175.8 lb

## 2024-01-10 DIAGNOSIS — Z7689 Persons encountering health services in other specified circumstances: Secondary | ICD-10-CM

## 2024-01-10 DIAGNOSIS — Z7985 Long-term (current) use of injectable non-insulin antidiabetic drugs: Secondary | ICD-10-CM

## 2024-01-10 DIAGNOSIS — Z0100 Encounter for examination of eyes and vision without abnormal findings: Secondary | ICD-10-CM

## 2024-01-10 DIAGNOSIS — E669 Obesity, unspecified: Secondary | ICD-10-CM | POA: Diagnosis not present

## 2024-01-10 DIAGNOSIS — R253 Fasciculation: Secondary | ICD-10-CM

## 2024-01-10 DIAGNOSIS — Z6828 Body mass index (BMI) 28.0-28.9, adult: Secondary | ICD-10-CM | POA: Diagnosis not present

## 2024-01-10 MED ORDER — SEMAGLUTIDE-WEIGHT MANAGEMENT 0.25 MG/0.5ML ~~LOC~~ SOAJ: 0.2500 mg | SUBCUTANEOUS | 0 refills | Status: DC

## 2024-01-10 NOTE — Progress Notes (Signed)
 Patient ID: Debra Pruitt, female    DOB: 03-08-1991  MRN: [id number]  CC: Weight Check  Subjective: Debra Pruitt is a 33 y.o. female who presents for weight check.   Her concerns today include:  - States Phentermine  37.5 mg caused her to have too much energy. States Phentermine  30 mg dose is tolerable. She would like to try a weight loss injection and quit taking Phentermine . She watches what she eats. She exercises.  - States left eye muscles twitch. Denies red flag symptoms. States thinks she may need eyeglasses.  - Established with Gynecology.  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   Melanocytic nevus of left lower extremity 08/30/2021   Localization-related idiopathic epilepsy and epileptic syndromes with seizures of localized onset, not intractable, without status epilepticus (HCC) 07/28/2021   Migraine headache without aura 07/28/2021   Mild persistent asthma 07/28/2021   Food intolerance 03/10/2021   Cervical radiculopathy 05/31/2020   Vitamin D  deficiency 01/09/2020     Current Outpatient Medications on File Prior to Visit  Medication Sig Dispense Refill   albuterol  (VENTOLIN  HFA) 108 (90 Base) MCG/ACT inhaler Inhale 1-2 puffs into the lungs every 6 (six) hours as needed. 1 each 0   FLUoxetine  (PROZAC ) 10 MG capsule TAKE 1 TABLET BY MOUTH EVERY DAY 90 capsule 0   hydrOXYzine  (VISTARIL ) 25 MG capsule Take 1 capsule (25 mg total) by mouth every 8 (eight) hours as needed. 30 capsule 1   linaclotide  (LINZESS ) 145 MCG CAPS capsule Take 1 capsule (145 mcg total) by mouth daily before breakfast. 90 capsule 1   norethindrone  (AYGESTIN ) 5 MG tablet TAKE 1 TABLET (5 MG TOTAL) BY MOUTH DAILY. 30 tablet 2   phentermine  37.5 MG capsule Take 1 capsule (37.5 mg total) by mouth every morning. 30 capsule 0   albuterol  (VENTOLIN  HFA) 108 (90 Base) MCG/ACT inhaler  Inhale 2 puffs into the lungs every 6 (six) hours as needed for wheezing or shortness of breath. (Patient not taking: Reported on 10/04/2023) 18 g 2   meloxicam  (MOBIC ) 15 MG tablet Take 1 tablet (15 mg total) by mouth daily. (Patient not taking: Reported on 10/04/2023) 30 tablet 0   polycarbophil (FIBERCON) 625 MG tablet Take 625 mg by mouth daily. (Patient not taking: Reported on 07/02/2023)     polyethylene glycol (MIRALAX / GLYCOLAX) 17 g packet Take 17 g by mouth daily. (Patient not taking: Reported on 10/04/2023)     predniSONE  (DELTASONE ) 20 MG tablet Take 1 tablet (20 mg total) by mouth daily with breakfast. (Patient not taking: Reported on 07/02/2023) 5 tablet 0   No current facility-administered medications on file prior to visit.    Allergies  Allergen Reactions   Ashwagandha-Rhodiola Hives   Mangifera Indica Swelling   Mango Flavoring Agent (Non-Screening) Swelling   Pineapple Swelling   Other Hives, Itching and Rash    Goli Nutrition supplement    Social History   Socioeconomic History   Marital status: Married    Spouse name: Not on file   Number of children: 2   Years of education: Not on file   Highest education level: Some college, no degree  Occupational History   Occupation: Theatre manager   Occupation: Interior and spatial designer  Tobacco Use   Smoking status: Never   Smokeless tobacco: Never  Vaping Use   Vaping status: Never Used  Substance and Sexual Activity  Alcohol use: Yes    Alcohol/week: 0.0 standard drinks of alcohol    Comment: Rare - social   Drug use: No   Sexual activity: Yes    Birth control/protection: None  Other Topics Concern   Not on file  Social History Narrative   Not on file   Social Drivers of Health   Financial Resource Strain: Low Risk  (06/21/2023)   Overall Financial Resource Strain (CARDIA)    Difficulty of Paying Living Expenses: Not hard at all  Food Insecurity: No Food Insecurity (10/04/2023)   Hunger Vital Sign    Worried About  Running Out of Food in the Last Year: Never true    Ran Out of Food in the Last Year: Never true  Transportation Needs: No Transportation Needs (10/04/2023)   PRAPARE - Administrator, Civil Service (Medical): No    Lack of Transportation (Non-Medical): No  Physical Activity: Insufficiently Active (06/21/2023)   Exercise Vital Sign    Days of Exercise per Week: 3 days    Minutes of Exercise per Session: 20 min  Stress: No Stress Concern Present (06/21/2023)   [employer] of Occupational Health - Occupational Stress Questionnaire    Feeling of Stress : Only a little  Social Connections: Moderately Integrated (06/21/2023)   Social Connection and Isolation Panel    Frequency of Communication with Friends and Family: More than three times a week    Frequency of Social Gatherings with Friends and Family: Once a week    Attends Religious Services: 1 to 4 times per year    Active Member of [employer] or Organizations: No    Attends Engineer, structural: Not on file    Marital Status: Married  Catering manager Violence: Not At Risk (12/05/2023)   Humiliation, Afraid, Rape, and Kick questionnaire    Fear of Current or Ex-Partner: No    Emotionally Abused: No    Physically Abused: No    Sexually Abused: No    Family History  Problem Relation Age of Onset   Hypertension Mother    Thyroid  disease Mother    HIV Father    Cirrhosis Father    Breast cancer Maternal Aunt    Breast cancer Maternal Grandmother    Asthma Maternal Grandmother    Hypertension Maternal Grandfather    Heart disease Maternal Grandfather    Diabetes Maternal Grandfather    Hypertension Paternal Grandmother    COPD Paternal Grandmother    Heart disease Paternal Grandmother    Hyperlipidemia Paternal Grandmother    Stroke Paternal Grandmother    Diabetes Paternal Grandmother    Hepatitis C Paternal Grandmother    Rheum arthritis Paternal Grandmother    Colon cancer Neg Hx    Esophageal cancer Neg  Hx    Stomach cancer Neg Hx     Past Surgical History:  Procedure Laterality Date   CESAREAN SECTION  04/30/2013   x 2   MULTIPLE TOOTH EXTRACTIONS     SALPINGECTOMY Right    for ectopic pregnancy    ROS: Review of Systems Negative except as stated above  PHYSICAL EXAM: BP 112/78   Pulse 74   Temp 98.6 F (37 C) (Oral)   Resp 16   Ht 5' 6 (1.676 m)   Wt 175 lb 12.8 oz (79.7 kg)   LMP 12/21/2023 (Exact Date)   SpO2 98%   BMI 28.37 kg/m   Wt Readings from Last 3 Encounters:  01/10/24 175 lb 12.8 oz (79.7  kg)  12/05/23 175 lb 3.2 oz (79.5 kg)  10/04/23 172 lb (78 kg)   Physical Exam HENT:     Head: Normocephalic and atraumatic.     Nose: Nose normal.     Mouth/Throat:     Mouth: Mucous membranes are moist.     Pharynx: Oropharynx is clear.  Eyes:     Extraocular Movements: Extraocular movements intact.     Conjunctiva/sclera: Conjunctivae normal.     Pupils: Pupils are equal, round, and reactive to light.  Cardiovascular:     Rate and Rhythm: Normal rate and regular rhythm.     Pulses: Normal pulses.     Heart sounds: Normal heart sounds.  Pulmonary:     Effort: Pulmonary effort is normal.     Breath sounds: Normal breath sounds.  Musculoskeletal:        General: Normal range of motion.     Cervical back: Normal range of motion and neck supple.  Neurological:     General: No focal deficit present.     Mental Status: She is alert and oriented to person, place, and time.  Psychiatric:        Mood and Affect: Mood normal.        Behavior: Behavior normal.    ASSESSMENT AND PLAN: 1. Encounter for weight management (Primary) 2. BMI 28.0-28.9,adult - Patient same weight as previous office visit.  - Trial Semaglutide -Weight Management as prescribed. Counseled on medication adherence/adverse effects.  - Routine screening.   - Follow-up with primary provider in 4 weeks or sooner if needed.  - semaglutide -weight management (WEGOVY ) 0.25 MG/0.5ML SOAJ SQ  injection; Inject 0.25 mg into the skin once a week.  Dispense: 2 mL; Refill: 0 - TSH  3. Routine eye exam 4. Eye muscle twitches - Referral to Ophthalmology for evaluation/management. - Ambulatory referral to Ophthalmology    Patient was given the opportunity to ask questions.  Patient verbalized understanding of the plan and was able to repeat key elements of the plan. Patient was given clear instructions to go to Emergency Department or return to medical center if symptoms don't improve, worsen, or new problems develop.The patient verbalized understanding.   Orders Placed This Encounter  Procedures   TSH   Ambulatory referral to Ophthalmology     Requested Prescriptions   Signed Prescriptions Disp Refills   semaglutide -weight management (WEGOVY ) 0.25 MG/0.5ML SOAJ SQ injection 2 mL 0    Sig: Inject 0.25 mg into the skin once a week.    Return in about 4 weeks (around 02/07/2024) for Follow-Up or next available weight check.  Greig JINNY Drones, NP

## 2024-01-10 NOTE — Progress Notes (Signed)
 Left eye has been twitching for over a month

## 2024-01-11 LAB — TSH: TSH: 1.53 u[IU]/mL (ref 0.450–4.500)

## 2024-01-14 ENCOUNTER — Ambulatory Visit: Admitting: Obstetrics and Gynecology

## 2024-01-14 ENCOUNTER — Encounter: Payer: Self-pay | Admitting: Family

## 2024-01-14 ENCOUNTER — Other Ambulatory Visit: Payer: Self-pay

## 2024-01-14 VITALS — BP 114/80 | HR 78 | Wt 176.0 lb

## 2024-01-14 DIAGNOSIS — Z1331 Encounter for screening for depression: Secondary | ICD-10-CM | POA: Diagnosis not present

## 2024-01-14 DIAGNOSIS — N939 Abnormal uterine and vaginal bleeding, unspecified: Secondary | ICD-10-CM | POA: Diagnosis not present

## 2024-01-14 NOTE — Progress Notes (Signed)
 GYNECOLOGY VISIT  Patient name: Debra Pruitt MRN [id number]  Date of birth: 03-Jun-1990 Chief Complaint:   Follow-up  History:  Gina Leblond is a 33 y.o. [id number] being seen today for AUB follo wup. Stopped norethindrone .   Discussed the use of AI scribe software for clinical note transcription with the patient, who gave verbal consent to proceed.  History of Present Illness Debra Pruitt is a 33 year old female who presents with concerns about menstrual irregularities and previous pathology results.  She stopped taking norethindrone  and has not had a menstrual period since. She recalls some light bleeding around July 25th but does not remember it clearly. There has been no significant bleeding or pain since then.  She recalls being told that her previous pathology result was described as a benign growth, and she is unsure of the exact location or nature of the finding. She reports no pain or other symptoms. Has had no prior experience of passing something similar in the past.   Her past treatments for menstrual pain included norethindrone , which she stopped, and she has previously used Depo-Provera and traditional birth control pills. Her hot flashes have ceased since stopping norethindrone .  She has a history of using various contraceptive methods, including Depo-Provera and birth control pills, and has had her tubes removed, with one tube remaining. She does not want IUD due to fear surrounding pain with removal.     Past Medical History:  Diagnosis Date   Anemia    Angio-edema    Anxiety    Bacterial vaginosis    Low back pain    Mild persistent asthma 07/28/2021   Ovarian cyst    Peripheral neuropathy    Urticaria    Vitamin D  deficiency     Past Surgical History:  Procedure Laterality Date   CESAREAN SECTION  04/30/2013   x 2   MULTIPLE TOOTH EXTRACTIONS     SALPINGECTOMY Right    for ectopic pregnancy    The following portions of the patient's  history were reviewed and updated as appropriate: allergies, current medications, past family history, past medical history, past social history, past surgical history and problem list.   Health Maintenance:   Last pap     Component Value Date/Time   DIAGPAP  12/19/2021 1121    - Negative for intraepithelial lesion or malignancy (NILM)   HPVHIGH Negative 12/19/2021 1121   HPVHIGH Negative 05/26/2020 1208   ADEQPAP  12/19/2021 1121    Satisfactory for evaluation; transformation zone component PRESENT.    Last mammogram: n/a   Review of Systems:  Pertinent items are noted in HPI. Comprehensive review of systems was otherwise negative.   Objective:  Physical Exam BP 114/80   Pulse 78   Wt 176 lb (79.8 kg)   LMP 12/21/2023 (Exact Date)   BMI 28.41 kg/m    Physical Exam Vitals and nursing note reviewed.  Constitutional:      Appearance: Normal appearance.  HENT:     Head: Normocephalic and atraumatic.  Pulmonary:     Effort: Pulmonary effort is normal.  Skin:    General: Skin is warm and dry.  Neurological:     General: No focal deficit present.     Mental Status: She is alert.  Psychiatric:        Mood and Affect: Mood normal.        Behavior: Behavior normal.        Thought Content: Thought content normal.  Judgment: Judgment normal.      Labs and Imaging FINAL MICROSCOPIC DIAGNOSIS:   A. VAGINAL TISSUE, EXCISION:       Benign genital stromal tumor.      Assessment & Plan:   Assessment & Plan Abnormal uterine bleeding and dysmenorrhea Abnormal uterine bleeding with dysmenorrhea. No significant bleeding since stopping norethindrone . Light bleeding on July 25th. Dysmenorrhea managed with norethindrone , discontinued due to side effects. - Schedule hysteroscopy to evaluate uterine cavity and obtain samples if necessary given recent passing of genital stromal tumor; EUA at that time - Advise no vaginal insertion for two weeks post-hysteroscopy to prevent  infection. - Discuss potential risks of hysteroscopy, including bleeding, infection, and uterine perforation. Perforation may require laparoscopy if injury to surrounding structures is suspected. - Consider alternative pain management options if dysmenorrhea returns, such as traditional birth control or endometriosis-specific medication like Orilissa , noting risk of anti-estrogenic side effects such as hot flashes returning   Dysmenorrhea Dysmenorrhea managed with norethindrone , discontinued due to hot flashes and passage of growth with improvement in bleeding. Considering alternative treatments if dysmenorrhea returns before the scheduled procedure. - Consider resuming traditional birth control or initiating endometriosis-specific medication if dysmenorrhea returns.  Benign lower genital tract neoplasm (site unspecified) Pathology indicates a benign lower genital tract neoplasm, unknown original location. Further evaluation needed to determine location and nature. - Perform hysteroscopy to assess and sample the neoplasm if visible.   Carter Quarry, MD [facility], [facility]

## 2024-01-15 ENCOUNTER — Ambulatory Visit: Payer: Self-pay | Admitting: Family

## 2024-01-16 NOTE — Telephone Encounter (Signed)
 Patient's health insurance determines if patient eligible for Zepboud. Please note Semaglutide -Weight Management prescribed (01/10/2024 10:17 AM EDT). Patient should not take both together. Specify patient's request if she would rather continue with Semaglutide -Weight Management or trial Zepbound .

## 2024-01-18 ENCOUNTER — Other Ambulatory Visit: Payer: Self-pay | Admitting: Family

## 2024-01-18 DIAGNOSIS — Z7689 Persons encountering health services in other specified circumstances: Secondary | ICD-10-CM

## 2024-01-18 DIAGNOSIS — Z6828 Body mass index (BMI) 28.0-28.9, adult: Secondary | ICD-10-CM

## 2024-01-18 MED ORDER — TIRZEPATIDE-WEIGHT MANAGEMENT 2.5 MG/0.5ML ~~LOC~~ SOLN: 2.5000 mg | SUBCUTANEOUS | 0 refills | Status: DC

## 2024-01-18 NOTE — Telephone Encounter (Signed)
Patient called and is aware of provider message.

## 2024-01-18 NOTE — Telephone Encounter (Signed)
 Call patient with update. Zepbound  prescribed. Please note I was unable to find requested [employer]. Also, Zepbound  may decrease effectiveness of Norethindrone  which is used for contraception management.

## 2024-01-22 ENCOUNTER — Telehealth: Payer: Self-pay | Admitting: Emergency Medicine

## 2024-01-22 ENCOUNTER — Other Ambulatory Visit: Payer: Self-pay

## 2024-01-22 NOTE — Telephone Encounter (Signed)
 Copied from CRM #[id number]. Topic: Clinical - Medication Prior Auth >> Jan 18, 2024  4:48 PM Tobias CROME wrote: Reason for CRM: Patient states she received message through [facility] app stating zepbound  rx has been received but pending.   Reached out to [facility] to confirm if prescription was received and able to be filled. Confirmed with pharmacy staff a prior authorization is needed. Advised patient.   Patient requesting prior authorization to be completed for zepbound .  Patient wants to know if you can send prescription straight to [facility]

## 2024-01-22 NOTE — Telephone Encounter (Unsigned)
 Copied from CRM #[id number]. Topic: Clinical - Medication Prior Auth >> Jan 18, 2024  4:48 PM Tobias CROME wrote: Reason for CRM: Patient states she received message through [facility] app stating zepbound  rx has been received but pending.   Reached out to [facility] to confirm if prescription was received and able to be filled. Confirmed with pharmacy staff a prior authorization is needed. Advised patient.   Patient requesting prior authorization to be completed for zepbound .

## 2024-01-22 NOTE — Telephone Encounter (Signed)
 On 01/18/2024 12:37 PM when I attempted to send Zepbound  to [facility] I was unable to find the pharmacy in Epic. If you will please attach patient's preferred pharmacy in her chart and I will resend. Please let me know once complete. Thank you.

## 2024-01-22 NOTE — Telephone Encounter (Signed)
 [name], please assist patient with prior authorization for Zepbound . Thank you.

## 2024-01-24 ENCOUNTER — Other Ambulatory Visit: Payer: Self-pay

## 2024-02-01 ENCOUNTER — Other Ambulatory Visit: Payer: Self-pay | Admitting: Family

## 2024-02-01 DIAGNOSIS — Z7689 Persons encountering health services in other specified circumstances: Secondary | ICD-10-CM

## 2024-02-01 DIAGNOSIS — Z6828 Body mass index (BMI) 28.0-28.9, adult: Secondary | ICD-10-CM

## 2024-02-01 MED ORDER — TIRZEPATIDE-WEIGHT MANAGEMENT 2.5 MG/0.5ML ~~LOC~~ SOLN: 2.5000 mg | SUBCUTANEOUS | 0 refills | Status: DC

## 2024-02-01 NOTE — Telephone Encounter (Signed)
 Complete

## 2024-02-05 NOTE — Telephone Encounter (Signed)
 I called patient to make her aware that prescribtion had been sent in to [facility] pharmacy no one answered so I left a voicemail to return my call.

## 2024-02-05 NOTE — Telephone Encounter (Signed)
Prescription

## 2024-02-13 NOTE — Telephone Encounter (Signed)
 I called patient to make her aware that her prescription has been sent to [facility] pharmacy no one answered so I left a voicemail to return my call.

## 2024-02-14 ENCOUNTER — Encounter: Payer: Self-pay | Admitting: Family

## 2024-02-14 DIAGNOSIS — G514 Facial myokymia: Secondary | ICD-10-CM | POA: Diagnosis not present

## 2024-02-14 DIAGNOSIS — H04123 Dry eye syndrome of bilateral lacrimal glands: Secondary | ICD-10-CM | POA: Diagnosis not present

## 2024-02-14 NOTE — Telephone Encounter (Signed)
 Patient called with message regarding her medication at pharmacy

## 2024-02-19 ENCOUNTER — Ambulatory Visit: Admitting: Family

## 2024-02-19 ENCOUNTER — Telehealth: Payer: Self-pay

## 2024-02-19 ENCOUNTER — Other Ambulatory Visit: Payer: Self-pay

## 2024-02-19 ENCOUNTER — Ambulatory Visit (INDEPENDENT_AMBULATORY_CARE_PROVIDER_SITE_OTHER)

## 2024-02-19 ENCOUNTER — Encounter: Payer: Self-pay | Admitting: Family

## 2024-02-19 VITALS — BP 115/85 | HR 94 | Temp 98.3°F | Resp 16 | Ht 66.0 in | Wt 171.2 lb

## 2024-02-19 DIAGNOSIS — R0789 Other chest pain: Secondary | ICD-10-CM | POA: Diagnosis not present

## 2024-02-19 DIAGNOSIS — Z6827 Body mass index (BMI) 27.0-27.9, adult: Secondary | ICD-10-CM | POA: Diagnosis not present

## 2024-02-19 DIAGNOSIS — F411 Generalized anxiety disorder: Secondary | ICD-10-CM

## 2024-02-19 DIAGNOSIS — Z760 Encounter for issue of repeat prescription: Secondary | ICD-10-CM | POA: Diagnosis not present

## 2024-02-19 DIAGNOSIS — Z7689 Persons encountering health services in other specified circumstances: Secondary | ICD-10-CM | POA: Diagnosis not present

## 2024-02-19 MED ORDER — TIRZEPATIDE-WEIGHT MANAGEMENT 5 MG/0.5ML ~~LOC~~ SOLN: 5.0000 mg | SUBCUTANEOUS | 0 refills | Status: DC

## 2024-02-19 NOTE — Telephone Encounter (Signed)
 I called the patient to see if she was available for surgery w/ Dr. Jeralyn on 03/03/24 at 9 am. Patient stated her daughter has an important appt on 03/03/24. I advised that would call her back once the November schedule was available.

## 2024-02-19 NOTE — Progress Notes (Signed)
 4 week follow up, patient scored a 15 on the GAD-7

## 2024-02-19 NOTE — Progress Notes (Signed)
 Patient ID: Debra Pruitt, female    DOB: [birth year]  MRN: [id number]  CC: Weight Check  Subjective: Debra Pruitt is a 34 y.o. female who presents for weight check.   Her concerns today include: - Doing well on Tirzepatide , no issues/concerns.  - Anxiety. States she quit taking Fluoxetine  due to making her drowsy. States Hydroxyzine  does not work quickly enough. She denies thoughts of self-harm, suicidal ideations, homicidal ideations. - Intermittent chest tightness. States considered if related to food she consumed or asthma.  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   Melanocytic nevus of left lower extremity 08/30/2021   Localization-related idiopathic epilepsy and epileptic syndromes with seizures of localized onset, not intractable, without status epilepticus (HCC) 07/28/2021   Migraine headache without aura 07/28/2021   Mild persistent asthma 07/28/2021   Food intolerance 03/10/2021   Cervical radiculopathy 05/31/2020   Vitamin D  deficiency 01/09/2020     Current Outpatient Medications on File Prior to Visit  Medication Sig Dispense Refill   hydrOXYzine  (VISTARIL ) 25 MG capsule Take 1 capsule (25 mg total) by mouth every 8 (eight) hours as needed. 30 capsule 1   Multiple Vitamin (MULTIVITAMIN ADULT PO) Take by mouth.     tirzepatide  (ZEPBOUND ) 2.5 MG/0.5ML injection vial Inject 2.5 mg into the skin once a week. 3 mL 0   albuterol  (VENTOLIN  HFA) 108 (90 Base) MCG/ACT inhaler Inhale 1-2 puffs into the lungs every 6 (six) hours as needed. (Patient not taking: Reported on 02/19/2024) 1 each 0   albuterol  (VENTOLIN  HFA) 108 (90 Base) MCG/ACT inhaler Inhale 2 puffs into the lungs every 6 (six) hours as needed for wheezing or shortness of breath. (Patient not taking: Reported on 10/04/2023) 18 g 2   FLUoxetine  (PROZAC ) 10 MG capsule TAKE 1 TABLET BY MOUTH EVERY DAY  (Patient not taking: Reported on 02/19/2024) 90 capsule 0   linaclotide  (LINZESS ) 145 MCG CAPS capsule Take 1 capsule (145 mcg total) by mouth daily before breakfast. (Patient not taking: Reported on 01/14/2024) 90 capsule 1   meloxicam  (MOBIC ) 15 MG tablet Take 1 tablet (15 mg total) by mouth daily. (Patient not taking: Reported on 10/04/2023) 30 tablet 0   norethindrone  (AYGESTIN ) 5 MG tablet TAKE 1 TABLET (5 MG TOTAL) BY MOUTH DAILY. (Patient not taking: Reported on 01/14/2024) 30 tablet 2   phentermine  37.5 MG capsule Take 1 capsule (37.5 mg total) by mouth every morning. (Patient not taking: Reported on 01/14/2024) 30 capsule 0   polycarbophil (FIBERCON) 625 MG tablet Take 625 mg by mouth daily. (Patient not taking: Reported on 01/14/2024)     polyethylene glycol (MIRALAX / GLYCOLAX) 17 g packet Take 17 g by mouth daily. (Patient not taking: Reported on 01/14/2024)     predniSONE  (DELTASONE ) 20 MG tablet Take 1 tablet (20 mg total) by mouth daily with breakfast. (Patient not taking: Reported on 01/14/2024) 5 tablet 0   semaglutide -weight management (WEGOVY ) 0.25 MG/0.5ML SOAJ SQ injection Inject 0.25 mg into the skin once a week. (Patient not taking: Reported on 01/14/2024) 2 mL 0   triazolam  (HALCION ) 0.25 MG tablet SMARTSIG:1 Tablet(s) By Mouth (Patient not taking: Reported on 01/14/2024)     No current facility-administered medications on file prior to visit.    Allergies  Allergen Reactions   Ashwagandha-Rhodiola Hives   Mangifera Indica Swelling   Mango Flavoring Agent (Non-Screening) Swelling   Pineapple Swelling  Other Hives, Itching and Rash    Goli Nutrition supplement    Social History   Socioeconomic History   Marital status: Married    Spouse name: Not on file   Number of children: 2   Years of education: Not on file   Highest education level: Some college, no degree  Occupational History   Occupation: Theatre manager   Occupation: Interior and spatial designer  Tobacco Use   Smoking  status: Never   Smokeless tobacco: Never  Vaping Use   Vaping status: Never Used  Substance and Sexual Activity   Alcohol use: Yes    Alcohol/week: 0.0 standard drinks of alcohol    Comment: Rare - social   Drug use: No   Sexual activity: Yes    Birth control/protection: None  Other Topics Concern   Not on file  Social History Narrative   Not on file   Social Drivers of Health   Financial Resource Strain: Low Risk  (06/21/2023)   Overall Financial Resource Strain (CARDIA)    Difficulty of Paying Living Expenses: Not hard at all  Food Insecurity: No Food Insecurity (01/14/2024)   Hunger Vital Sign    Worried About Running Out of Food in the Last Year: Never true    Ran Out of Food in the Last Year: Never true  Transportation Needs: No Transportation Needs (10/04/2023)   PRAPARE - Administrator, Civil Service (Medical): No    Lack of Transportation (Non-Medical): No  Physical Activity: Insufficiently Active (06/21/2023)   Exercise Vital Sign    Days of Exercise per Week: 3 days    Minutes of Exercise per Session: 20 min  Stress: No Stress Concern Present (06/21/2023)   [employer] of Occupational Health - Occupational Stress Questionnaire    Feeling of Stress : Only a little  Social Connections: Moderately Integrated (06/21/2023)   Social Connection and Isolation Panel    Frequency of Communication with Friends and Family: More than three times a week    Frequency of Social Gatherings with Friends and Family: Once a week    Attends Religious Services: 1 to 4 times per year    Active Member of [employer] or Organizations: No    Attends Engineer, structural: Not on file    Marital Status: Married  Catering manager Violence: Not At Risk (12/05/2023)   Humiliation, Afraid, Rape, and Kick questionnaire    Fear of Current or Ex-Partner: No    Emotionally Abused: No    Physically Abused: No    Sexually Abused: No    Family History  Problem Relation Age of  Onset   Hypertension Mother    Thyroid  disease Mother    HIV Father    Cirrhosis Father    Breast cancer Maternal Aunt    Breast cancer Maternal Grandmother    Asthma Maternal Grandmother    Hypertension Maternal Grandfather    Heart disease Maternal Grandfather    Diabetes Maternal Grandfather    Hypertension Paternal Grandmother    COPD Paternal Grandmother    Heart disease Paternal Grandmother    Hyperlipidemia Paternal Grandmother    Stroke Paternal Grandmother    Diabetes Paternal Grandmother    Hepatitis C Paternal Grandmother    Rheum arthritis Paternal Grandmother    Colon cancer Neg Hx    Esophageal cancer Neg Hx    Stomach cancer Neg Hx     Past Surgical History:  Procedure Laterality Date   CESAREAN SECTION  04/30/2013  x 2   MULTIPLE TOOTH EXTRACTIONS     SALPINGECTOMY Right    for ectopic pregnancy    ROS: Review of Systems Negative except as stated above  PHYSICAL EXAM: BP 115/85   Pulse 94   Temp 98.3 F (36.8 C) (Oral)   Resp 16   Ht 5' 6 (1.676 m)   Wt 171 lb 3.2 oz (77.7 kg)   LMP 02/14/2024 (Exact Date)   SpO2 98%   BMI 27.63 kg/m   Wt Readings from Last 3 Encounters:  02/19/24 171 lb 3.2 oz (77.7 kg)  01/14/24 176 lb (79.8 kg)  01/10/24 175 lb 12.8 oz (79.7 kg)   Physical Exam HENT:     Head: Normocephalic and atraumatic.     Nose: Nose normal.     Mouth/Throat:     Mouth: Mucous membranes are moist.     Pharynx: Oropharynx is clear.  Eyes:     Extraocular Movements: Extraocular movements intact.     Conjunctiva/sclera: Conjunctivae normal.     Pupils: Pupils are equal, round, and reactive to light.  Cardiovascular:     Rate and Rhythm: Normal rate and regular rhythm.     Pulses: Normal pulses.     Heart sounds: Normal heart sounds.  Pulmonary:     Effort: Pulmonary effort is normal.     Breath sounds: Normal breath sounds.  Musculoskeletal:        General: Normal range of motion.     Cervical back: Normal range of  motion and neck supple.  Neurological:     General: No focal deficit present.     Mental Status: She is alert and oriented to person, place, and time.  Psychiatric:        Mood and Affect: Mood normal.        Behavior: Behavior normal.    ASSESSMENT AND PLAN: 1. Encounter for weight management (Primary) 2. BMI 27.0-27.9,adult - Patient lost 5 pounds since previous office visit.  - Increase Tirzepatide  from 2.5 mg to 5 mg as prescribed. Please note Tirzepatide  5 mg to begin on 02/29/2024 so that patient can continue current Tirzepatide  2.5 mg for recommended 4 weeks.  - Follow-up with primary provider in 4 weeks or sooner if needed.  - tirzepatide  5 MG/0.5ML injection vial; Inject 5 mg into the skin once a week.  Dispense: 2 mL; Refill: 0  3. Generalized anxiety disorder - Patient denies thoughts of self-harm, suicidal ideations, homicidal ideations. - Patient declined pharmacological therapy.  - Patient declined referral to Psychiatry.  - Follow-up with primary provider as scheduled.   4. Chest tightness - Patient today in office with no cardiopulmonary/acute distress.  - EKG and Diagnostic Chest for evaluation. - Follow-up with primary provider as scheduled.  - EKG 12-Lead - DG Chest 2 View; Future   Patient was given the opportunity to ask questions.  Patient verbalized understanding of the plan and was able to repeat key elements of the plan. Patient was given clear instructions to go to Emergency Department or return to medical center if symptoms don't improve, worsen, or new problems develop.The patient verbalized understanding.   Orders Placed This Encounter  Procedures   DG Chest 2 View   EKG 12-Lead     Requested Prescriptions   Signed Prescriptions Disp Refills   tirzepatide  5 MG/0.5ML injection vial 2 mL 0    Sig: Inject 5 mg into the skin once a week.    Return in about 4 weeks (around 03/18/2024) for  Follow-Up or next available weight check .  Greig JINNY Drones, NP

## 2024-02-20 ENCOUNTER — Telehealth: Payer: Self-pay | Admitting: Family

## 2024-02-20 ENCOUNTER — Ambulatory Visit: Payer: Self-pay | Admitting: Family

## 2024-02-20 ENCOUNTER — Other Ambulatory Visit: Payer: Self-pay | Admitting: Family

## 2024-02-20 DIAGNOSIS — R9431 Abnormal electrocardiogram [ECG] [EKG]: Secondary | ICD-10-CM

## 2024-02-20 NOTE — Telephone Encounter (Signed)
 I spoke with patient and made them aware of their results and recommendations per PCP.  Patient verbalized understanding

## 2024-02-21 NOTE — Progress Notes (Unsigned)
 Cardiology Clinic Note   Patient Name: Debra Pruitt Date of Encounter: 02/22/2024  Primary Care Provider:  Lorren Greig PARAS, NP Primary Cardiologist:  None  Patient Profile    Debra Pruitt 33 year old female presents to the clinic today for follow-up evaluation of her abnormal EKG.  Past Medical History    Past Medical History:  Diagnosis Date   Anemia    Angio-edema    Anxiety    Bacterial vaginosis    Low back pain    Mild persistent asthma 07/28/2021   Ovarian cyst    Peripheral neuropathy    Urticaria    Vitamin D  deficiency    Past Surgical History:  Procedure Laterality Date   CESAREAN SECTION  04/30/2013   x 2   MULTIPLE TOOTH EXTRACTIONS     SALPINGECTOMY Right    for ectopic pregnancy    Allergies  Allergies  Allergen Reactions   Ashwagandha-Rhodiola Hives   Mangifera Indica Swelling   Mango Flavoring Agent (Non-Screening) Swelling   Pineapple Swelling   Other Hives, Itching and Rash    Goli Nutrition supplement    History of Present Illness    Debra Pruitt has a PMH of generalized anxiety disorder, IBS, neuropathy, migraine headaches, mild persistent asthma, idiopathic epilepsy, and RBBB.  She reports a family history of heart disease with a paternal grandmother that had congestive heart failure and hypertension and maternal grandmother with hypertension.  Her mom also has hypertension.  She reports that her maternal grandfather had PPM.  She was referred for evaluation by her PCP due to chest discomfort/tightness and abnormal EKG.  She presents to the clinic today for evaluation and states she has been diagnosed with asthma recently and has been taking nebulizer treatments.  She was concerned that this was not helping her chest discomfort.  She reports episodes of chest discomfort at night with racing heart rate.  She describes her discomfort as a heaviness.  She had a chest x-ray that was unremarkable.  Her EKG showed sinus rhythm with  incomplete right bundle branch block 70 bpm.  We reviewed options for testing and further evaluation.  She expressed understanding.  I will order CBC, BMP, magnesium, echocardiogram, and exercise stress test.  Today she denies  lower extremity edema, fatigue, palpitations, melena, hematuria, hemoptysis, diaphoresis, weakness, presyncope, syncope, orthopnea, and PND.   Home Medications    Prior to Admission medications   Medication Sig Start Date End Date Taking? Authorizing Provider  albuterol  (VENTOLIN  HFA) 108 (90 Base) MCG/ACT inhaler Inhale 1-2 puffs into the lungs every 6 (six) hours as needed. Patient not taking: Reported on 02/19/2024 05/27/23   Mayer, Jodi R, NP  albuterol  (VENTOLIN  HFA) 108 [id number] Base) MCG/ACT inhaler Inhale 2 puffs into the lungs every 6 (six) hours as needed for wheezing or shortness of breath. Patient not taking: Reported on 10/04/2023 06/21/23   Cyndi Shaver, PA-C  FLUoxetine  (PROZAC ) 10 MG capsule TAKE 1 TABLET BY MOUTH EVERY DAY Patient not taking: Reported on 02/19/2024 01/01/24   Lorren Greig PARAS, NP  hydrOXYzine  (VISTARIL ) 25 MG capsule Take 1 capsule (25 mg total) by mouth every 8 (eight) hours as needed. 07/02/23   Lorren Greig PARAS, NP  linaclotide  (LINZESS ) 145 MCG CAPS capsule Take 1 capsule (145 mcg total) by mouth daily before breakfast. Patient not taking: Reported on 01/14/2024 11/28/22   Stacia Glendia BRAVO, MD  meloxicam  (MOBIC ) 15 MG tablet Take 1 tablet (15 mg total) by mouth daily. Patient not taking: Reported  on 10/04/2023 09/05/23 09/04/24  Williams, Megan E, NP  Multiple Vitamin (MULTIVITAMIN ADULT PO) Take by mouth.    [provider]  norethindrone  (AYGESTIN ) 5 MG tablet TAKE 1 TABLET (5 MG TOTAL) BY MOUTH DAILY. Patient not taking: Reported on 01/14/2024 01/07/24   Ajewole, Christana, MD  phentermine  37.5 MG capsule Take 1 capsule (37.5 mg total) by mouth every morning. Patient not taking: Reported on 01/14/2024 12/05/23   Lorren Greig PARAS, NP   polycarbophil (FIBERCON) 625 MG tablet Take 625 mg by mouth daily. Patient not taking: Reported on 01/14/2024    [provider]  polyethylene glycol (MIRALAX / GLYCOLAX) 17 g packet Take 17 g by mouth daily. Patient not taking: Reported on 01/14/2024    [provider]  predniSONE  (DELTASONE ) 20 MG tablet Take 1 tablet (20 mg total) by mouth daily with breakfast. Patient not taking: Reported on 01/14/2024 06/21/23   Drubel, Manuelita, PA-C  semaglutide -weight management (WEGOVY ) 0.25 MG/0.5ML SOAJ SQ injection Inject 0.25 mg into the skin once a week. Patient not taking: Reported on 01/14/2024 01/10/24   Lorren Greig PARAS, NP  tirzepatide  (ZEPBOUND ) 2.5 MG/0.5ML injection vial Inject 2.5 mg into the skin once a week. 02/01/24   Lorren Greig PARAS, NP  tirzepatide  5 MG/0.5ML injection vial Inject 5 mg into the skin once a week. 02/29/24   Lorren Greig PARAS, NP  triazolam  (HALCION ) 0.25 MG tablet SMARTSIG:1 Tablet(s) By Mouth Patient not taking: Reported on 01/14/2024 10/31/23   [provider]    Family History    Family History  Problem Relation Age of Onset   Hypertension Mother    Thyroid  disease Mother    HIV Father    Cirrhosis Father    Breast cancer Maternal Aunt    Breast cancer Maternal Grandmother    Asthma Maternal Grandmother    Hypertension Maternal Grandfather    Heart disease Maternal Grandfather    Diabetes Maternal Grandfather    Hypertension Paternal Grandmother    COPD Paternal Grandmother    Heart disease Paternal Grandmother    Hyperlipidemia Paternal Grandmother    Stroke Paternal Grandmother    Diabetes Paternal Grandmother    Hepatitis C Paternal Grandmother    Rheum arthritis Paternal Grandmother    Colon cancer Neg Hx    Esophageal cancer Neg Hx    Stomach cancer Neg Hx    She indicated that her mother is alive. She indicated that her father is deceased. She indicated that the status of her maternal grandmother is unknown. She indicated that  the status of her maternal grandfather is unknown. She indicated that her paternal grandmother is deceased. She indicated that the status of her maternal aunt is unknown. She indicated that the status of her neg hx is unknown.  Social History    Social History   Socioeconomic History   Marital status: Married    Spouse name: Not on file   Number of children: 2   Years of education: Not on file   Highest education level: Some college, no degree  Occupational History   Occupation: Theatre manager   Occupation: Interior and spatial designer  Tobacco Use   Smoking status: Never   Smokeless tobacco: Never  Vaping Use   Vaping status: Never Used  Substance and Sexual Activity   Alcohol use: Yes    Alcohol/week: 0.0 standard drinks of alcohol    Comment: Rare - social   Drug use: No   Sexual activity: Yes    Birth control/protection: None  Other Topics Concern   Not on file  Social History Narrative   Not on file   Social Drivers of Health   Financial Resource Strain: Low Risk  (06/21/2023)   Overall Financial Resource Strain (CARDIA)    Difficulty of Paying Living Expenses: Not hard at all  Food Insecurity: No Food Insecurity (01/14/2024)   Hunger Vital Sign    Worried About Running Out of Food in the Last Year: Never true    Ran Out of Food in the Last Year: Never true  Transportation Needs: No Transportation Needs (10/04/2023)   PRAPARE - Administrator, Civil Service (Medical): No    Lack of Transportation (Non-Medical): No  Physical Activity: Insufficiently Active (06/21/2023)   Exercise Vital Sign    Days of Exercise per Week: 3 days    Minutes of Exercise per Session: 20 min  Stress: No Stress Concern Present (06/21/2023)   [employer] of Occupational Health - Occupational Stress Questionnaire    Feeling of Stress : Only a little  Social Connections: Moderately Integrated (06/21/2023)   Social Connection and Isolation Panel    Frequency of Communication with Friends  and Family: More than three times a week    Frequency of Social Gatherings with Friends and Family: Once a week    Attends Religious Services: 1 to 4 times per year    Active Member of [employer] or Organizations: No    Attends Engineer, structural: Not on file    Marital Status: Married  Catering manager Violence: Not At Risk (12/05/2023)   Humiliation, Afraid, Rape, and Kick questionnaire    Fear of Current or Ex-Partner: No    Emotionally Abused: No    Physically Abused: No    Sexually Abused: No     Review of Systems    General:  No chills, fever, night sweats or weight changes.  Cardiovascular:  No chest pain, dyspnea on exertion, edema, orthopnea, palpitations, paroxysmal nocturnal dyspnea. Dermatological: No rash, lesions/masses Respiratory: No cough, dyspnea Urologic: No hematuria, dysuria Abdominal:   No nausea, vomiting, diarrhea, bright red blood per rectum, melena, or hematemesis Neurologic:  No visual changes, wkns, changes in mental status. All other systems reviewed and are otherwise negative except as noted above.  Physical Exam    VS:  BP 104/69 (BP Location: Left Arm, Patient Position: Sitting, Cuff Size: Normal)   Pulse 84   Ht 5' 6 (1.676 m)   Wt 170 lb (77.1 kg)   LMP 02/14/2024 (Exact Date)   SpO2 99%   BMI 27.44 kg/m  , BMI Body mass index is 27.44 kg/m. GEN: Well nourished, well developed, in no acute distress. HEENT: normal. Neck: Supple, no JVD, carotid bruits, or masses. Cardiac: RRR, no murmurs, rubs, or gallops. No clubbing, cyanosis, edema.  Radials/DP/PT 2+ and equal bilaterally.  Respiratory:  Respirations regular and unlabored, clear to auscultation bilaterally. GI: Soft, nontender, nondistended, BS + x 4. MS: no deformity or atrophy. Skin: warm and dry, no rash. Neuro:  Strength and sensation are intact. Psych: Normal affect.  Accessory Clinical Findings    Recent Labs: 07/31/2023: Hemoglobin 13.1; Platelets 239 08/21/2023: ALT 10;  BUN 10; Creatinine, Ser 0.84; Potassium 4.4; Sodium 142 01/10/2024: TSH 1.530   Recent Lipid Panel    Component Value Date/Time   CHOL 155 08/28/2023 1133   TRIG 78 08/28/2023 1133   HDL 69 08/28/2023 1133   CHOLHDL 2.2 08/28/2023 1133   CHOLHDL 3 01/08/2020 1435   VLDL  9.6 01/08/2020 1435   LDLCALC 71 08/28/2023 1133         ECG personally reviewed by me today-none today.    EKG 02/19/2024 Normal sinus rhythm with incomplete right bundle branch block 70 bpm      Assessment & Plan   1.  Abnormal EKG, chest discomfort, RBBB-EKG 02/19/2024 shows sinus rhythm with incomplete right bundle branch block 70 bpm.  Denies lightheadedness, presyncope or syncope.  Reports that she has occasional episodes of chest tightness which she notices at night with increased/racing heart rate.  She describes the episodes as a heaviness in her chest. Order CBC, BMP, magnesium Order echocardiogram Exercise stress test  Asthma-reports compliance with albuterol  and nebulizer treatments.   Encouraged regular physical activity Continue current medical therapy  Anxiety-mood stable in clinic today.  Concerned about episodes of chest tightness. Continue fluoxetine , hydroxyzine  Follows with PCP  Disposition: Follow-up with Dr. Loni or me in 2 months.   Josefa HERO. Geneieve Duell NP-C     02/22/2024, 3:12 PM [facility] [address] [place], [place] [address] Office [phone]    Notice: This dictation was prepared with Dragon dictation along with smaller phrase technology. Any transcriptional errors that result from this process are unintentional and may not be corrected upon review.   I spent 14 minutes examining this patient, reviewing medications, and using patient centered shared decision making involving their cardiac care.   I spent  20 minutes reviewing past medical history,  medications, and prior cardiac tests.

## 2024-02-22 ENCOUNTER — Encounter: Payer: Self-pay | Admitting: General Practice

## 2024-02-22 ENCOUNTER — Ambulatory Visit: Attending: General Practice | Admitting: General Practice

## 2024-02-22 VITALS — BP 104/69 | HR 84 | Ht 66.0 in | Wt 170.0 lb

## 2024-02-22 DIAGNOSIS — R0789 Other chest pain: Secondary | ICD-10-CM | POA: Diagnosis not present

## 2024-02-22 DIAGNOSIS — I4510 Unspecified right bundle-branch block: Secondary | ICD-10-CM

## 2024-02-22 DIAGNOSIS — R9431 Abnormal electrocardiogram [ECG] [EKG]: Secondary | ICD-10-CM | POA: Diagnosis not present

## 2024-02-22 DIAGNOSIS — J4530 Mild persistent asthma, uncomplicated: Secondary | ICD-10-CM | POA: Diagnosis not present

## 2024-02-22 DIAGNOSIS — F419 Anxiety disorder, unspecified: Secondary | ICD-10-CM | POA: Diagnosis not present

## 2024-02-22 NOTE — Patient Instructions (Signed)
 Medication Instructions:  NO CHANGES  Lab Work: CBC, BMET, AND MAGNESIUM TO BE DONE TODAY.   Testing/Procedures: Your physician has requested that you have an Exercise Stress Test. An exercise tolerance test is a test to check how your heart works during exercise. You will need to walk on a treadmill for this test. An electrocardiogram (ECG) will record your heartbeat when you are at rest and when you are exercising.    Please arrive 15 minutes prior to your appointment time for registration and insurance purposes.   The test will take approximately 45 minutes to complete.   How to prepare for your Exercise Stress Test: Do bring a list of your current medications with you.  If not listed below, you may take your medications as normal. Do wear comfortable clothes (no dresses or overalls) and walking shoes, tennis shoes preferred (no heels or open toed shoes are allowed) Do Not wear cologne, perfume, aftershave or lotions (deodorant is allowed). Do not drink or eat foods with caffeine for 24 hours before the test. (Chocolate, coffee, tea, or energy drinks) If you use an inhaler, bring it with you to the test. Do not smoke for 4 hours before the test.    If these instructions are not followed, your test will have to be rescheduled.   If you cannot keep your appointment, please provide 24 hours notification to our office, to avoid a possible $50 charge to your account.  Your physician has requested that you have an ECHOCARDIOGRAM. Echocardiography is a painless test that uses sound waves to create images of your heart. It provides your doctor with information about the size and shape of your heart and how well your heart's chambers and valves are working. This procedure takes approximately one hour. There are no restrictions for this procedure. Please do NOT wear cologne, perfume, aftershave, or lotions (deodorant is allowed). Please arrive 15 minutes prior to your appointment  time.  Please note: We ask at that you not bring children with you during ultrasound (echo/ vascular) testing. Due to room size and safety concerns, children are not allowed in the ultrasound rooms during exams. Our front office staff cannot provide observation of children in our lobby area while testing is being conducted. An adult accompanying a patient to their appointment will only be allowed in the ultrasound room at the discretion of the ultrasound technician under special circumstances. We apologize for any inconvenience.      Follow-Up: At [facility], you and your health needs are our priority.  As part of our continuing mission to provide you with exceptional heart care, our providers are all part of one team.  This team includes your primary Cardiologist (physician) and Advanced Practice Providers or APPs (Physician Assistants and Nurse Practitioners) who all work together to provide you with the care you need, when you need it.  Your next appointment:   2 MONTHS  Provider:   JOSEFA BEAUVAIS, NP

## 2024-02-23 LAB — CBC
Hematocrit: 38 % (ref 34.0–46.6)
Hemoglobin: 12.4 g/dL (ref 11.1–15.9)
MCH: 31.4 pg (ref 26.6–33.0)
MCHC: 32.6 g/dL (ref 31.5–35.7)
MCV: 96 fL (ref 79–97)
Platelets: 223 x10E3/uL (ref 150–450)
RBC: 3.95 x10E6/uL (ref 3.77–5.28)
RDW: 12.5 % (ref 11.7–15.4)
WBC: 6.3 x10E3/uL (ref 3.4–10.8)

## 2024-02-23 LAB — BASIC METABOLIC PANEL WITH GFR
BUN/Creatinine Ratio: 14 (ref 9–23)
BUN: 12 mg/dL (ref 6–20)
CO2: 23 mmol/L (ref 20–29)
Calcium: 9.2 mg/dL (ref 8.7–10.2)
Chloride: 102 mmol/L (ref 96–106)
Creatinine, Ser: 0.85 mg/dL (ref 0.57–1.00)
Glucose: 83 mg/dL (ref 70–99)
Potassium: 4.7 mmol/L (ref 3.5–5.2)
Sodium: 139 mmol/L (ref 134–144)
eGFR: 92 mL/min/1.73 (ref >59)

## 2024-02-23 LAB — MAGNESIUM: Magnesium: 2.1 mg/dL (ref 1.6–2.3)

## 2024-02-25 ENCOUNTER — Ambulatory Visit: Payer: Self-pay | Admitting: General Practice

## 2024-02-27 ENCOUNTER — Encounter: Payer: Self-pay | Admitting: *Deleted

## 2024-02-28 ENCOUNTER — Telehealth: Payer: Self-pay

## 2024-02-28 NOTE — Telephone Encounter (Signed)
 I called the patient to see if she's available for surgery w/ Dr. Jeralyn on 03/31/24 @ 2 pm. Patient states she's having an echo test, and would like to receive the results before scheduling surgery.

## 2024-02-29 ENCOUNTER — Ambulatory Visit (HOSPITAL_COMMUNITY)

## 2024-03-06 ENCOUNTER — Ambulatory Visit: Admitting: Pulmonary Disease

## 2024-03-11 ENCOUNTER — Ambulatory Visit (HOSPITAL_COMMUNITY)
Admission: RE | Admit: 2024-03-11 | Discharge: 2024-03-11 | Disposition: A | Discharge status: Home / Self Care | Source: Ambulatory Visit | Attending: Internal Medicine | Admitting: Internal Medicine

## 2024-03-11 DIAGNOSIS — R0789 Other chest pain: Secondary | ICD-10-CM | POA: Diagnosis not present

## 2024-03-11 DIAGNOSIS — R9431 Abnormal electrocardiogram [ECG] [EKG]: Secondary | ICD-10-CM | POA: Insufficient documentation

## 2024-03-11 LAB — ECHOCARDIOGRAM COMPLETE
Area-P 1/2: 3.61 cm2
S' Lateral: 3 cm

## 2024-03-25 ENCOUNTER — Encounter: Payer: Self-pay | Admitting: Family

## 2024-03-25 ENCOUNTER — Ambulatory Visit: Admitting: Family

## 2024-03-25 VITALS — BP 110/77 | HR 90 | Temp 98.2°F | Resp 16 | Ht 66.0 in | Wt 162.6 lb

## 2024-03-25 DIAGNOSIS — R635 Abnormal weight gain: Secondary | ICD-10-CM | POA: Diagnosis not present

## 2024-03-25 MED ORDER — TIRZEPATIDE-WEIGHT MANAGEMENT 7.5 MG/0.5ML ~~LOC~~ SOLN: 7.5000 mg | SUBCUTANEOUS | 0 refills | Status: DC

## 2024-03-25 NOTE — Progress Notes (Signed)
Follow up, weight check

## 2024-03-25 NOTE — Progress Notes (Signed)
 Patient ID: Debra Pruitt, female    DOB: [birth year]  MRN: [id number]  CC: Weight Check   Subjective: Debra Pruitt is a 33 y.o. female who presents for weight check.   Her concerns today include:  Doing well on Tirzepatide , no issues/concerns. She is watching what she eats.   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   Melanocytic nevus of left lower extremity 08/30/2021   Localization-related idiopathic epilepsy and epileptic syndromes with seizures of localized onset, not intractable, without status epilepticus (HCC) 07/28/2021   Migraine headache without aura 07/28/2021   Mild persistent asthma 07/28/2021   Food intolerance 03/10/2021   Cervical radiculopathy 05/31/2020   Vitamin D  deficiency 01/09/2020     Current Outpatient Medications on File Prior to Visit  Medication Sig Dispense Refill   albuterol  (VENTOLIN  HFA) 108 (90 Base) MCG/ACT inhaler Inhale 1-2 puffs into the lungs every 6 (six) hours as needed. 1 each 0   albuterol  (VENTOLIN  HFA) 108 (90 Base) MCG/ACT inhaler Inhale 2 puffs into the lungs every 6 (six) hours as needed for wheezing or shortness of breath. 18 g 2   Multiple Vitamin (MULTIVITAMIN ADULT PO) Take by mouth.     tirzepatide  (ZEPBOUND ) 2.5 MG/0.5ML injection vial Inject 2.5 mg into the skin once a week. 3 mL 0   tirzepatide  5 MG/0.5ML injection vial Inject 5 mg into the skin once a week. 2 mL 0   FLUoxetine  (PROZAC ) 10 MG capsule TAKE 1 TABLET BY MOUTH EVERY DAY (Patient not taking: Reported on 02/22/2024) 90 capsule 0   hydrOXYzine  (VISTARIL ) 25 MG capsule Take 1 capsule (25 mg total) by mouth every 8 (eight) hours as needed. (Patient not taking: Reported on 02/22/2024) 30 capsule 1   linaclotide  (LINZESS ) 145 MCG CAPS capsule Take 1 capsule (145 mcg total) by mouth daily before breakfast. (Patient not taking: Reported on 02/22/2024) 90  capsule 1   norethindrone  (AYGESTIN ) 5 MG tablet TAKE 1 TABLET (5 MG TOTAL) BY MOUTH DAILY. (Patient not taking: Reported on 02/22/2024) 30 tablet 2   phentermine  37.5 MG capsule Take 1 capsule (37.5 mg total) by mouth every morning. (Patient not taking: Reported on 02/22/2024) 30 capsule 0   polyethylene glycol (MIRALAX / GLYCOLAX) 17 g packet Take 17 g by mouth daily. (Patient not taking: Reported on 02/22/2024)     predniSONE  (DELTASONE ) 20 MG tablet Take 1 tablet (20 mg total) by mouth daily with breakfast. (Patient not taking: Reported on 02/22/2024) 5 tablet 0   semaglutide -weight management (WEGOVY ) 0.25 MG/0.5ML SOAJ SQ injection Inject 0.25 mg into the skin once a week. (Patient not taking: Reported on 02/22/2024) 2 mL 0   No current facility-administered medications on file prior to visit.    Allergies  Allergen Reactions   Ashwagandha-Rhodiola Hives   Mangifera Indica Swelling   Mango Flavoring Agent (Non-Screening) Swelling   Pineapple Swelling   Other Hives, Itching and Rash    Goli Nutrition supplement    Social History   Socioeconomic History   Marital status: Married    Spouse name: Not on file   Number of children: 2   Years of education: Not on file   Highest education level: Some college, no degree  Occupational History   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  Tobacco Use   Smoking status: Never   Smokeless tobacco: Never  Vaping Use  Vaping status: Never Used  Substance and Sexual Activity   Alcohol use: Yes    Alcohol/week: 0.0 standard drinks of alcohol    Comment: Rare - social   Drug use: No   Sexual activity: Yes    Birth control/protection: None  Other Topics Concern   Not on file  Social History Narrative   Not on file   Social Drivers of Health   Financial Resource Strain: Low Risk  (06/21/2023)   Overall Financial Resource Strain (CARDIA)    Difficulty of Paying Living Expenses: Not hard at all  Food Insecurity: No Food  Insecurity (01/14/2024)   Hunger Vital Sign    Worried About Running Out of Food in the Last Year: Never true    Ran Out of Food in the Last Year: Never true  Transportation Needs: No Transportation Needs (10/04/2023)   PRAPARE - Administrator, Civil Service (Medical): No    Lack of Transportation (Non-Medical): No  Physical Activity: Insufficiently Active (06/21/2023)   Exercise Vital Sign    Days of Exercise per Week: 3 days    Minutes of Exercise per Session: 20 min  Stress: No Stress Concern Present (06/21/2023)   [employer] of Occupational Health - Occupational Stress Questionnaire    Feeling of Stress : Only a little  Social Connections: Moderately Integrated (06/21/2023)   Social Connection and Isolation Panel    Frequency of Communication with Friends and Family: More than three times a week    Frequency of Social Gatherings with Friends and Family: Once a week    Attends Religious Services: 1 to 4 times per year    Active Member of [employer] or Organizations: No    Attends Engineer, Structural: Not on file    Marital Status: Married  Catering Manager Violence: Not At Risk (12/05/2023)   Humiliation, Afraid, Rape, and Kick questionnaire    Fear of Current or Ex-Partner: No    Emotionally Abused: No    Physically Abused: No    Sexually Abused: No    Family History  Problem Relation Age of Onset   Hypertension Mother    Thyroid  disease Mother    HIV Father    Cirrhosis Father    Breast cancer Maternal Aunt    Breast cancer Maternal Grandmother    Asthma Maternal Grandmother    Hypertension Maternal Grandfather    Heart disease Maternal Grandfather    Diabetes Maternal Grandfather    Hypertension Paternal Grandmother    COPD Paternal Grandmother    Heart disease Paternal Grandmother    Hyperlipidemia Paternal Grandmother    Stroke Paternal Grandmother    Diabetes Paternal Grandmother    Hepatitis C Paternal Grandmother    Rheum arthritis Paternal  Grandmother    Colon cancer Neg Hx    Esophageal cancer Neg Hx    Stomach cancer Neg Hx     Past Surgical History:  Procedure Laterality Date   CESAREAN SECTION  04/30/2013   x 2   MULTIPLE TOOTH EXTRACTIONS     SALPINGECTOMY Right    for ectopic pregnancy    ROS: Review of Systems Negative except as stated above  PHYSICAL EXAM: BP 110/77   Pulse 90   Temp 98.2 F (36.8 C) (Oral)   Resp 16   Ht 5' 6 (1.676 m)   Wt 162 lb 9.6 oz (73.8 kg)   LMP 03/15/2024 (Exact Date)   SpO2 96%   BMI 26.24 kg/m   Wt Readings  from Last 3 Encounters:  03/25/24 162 lb 9.6 oz (73.8 kg)  02/22/24 170 lb (77.1 kg)  02/19/24 171 lb 3.2 oz (77.7 kg)   Physical Exam HENT:     Head: Normocephalic and atraumatic.     Nose: Nose normal.     Mouth/Throat:     Mouth: Mucous membranes are moist.     Pharynx: Oropharynx is clear.  Eyes:     Extraocular Movements: Extraocular movements intact.     Conjunctiva/sclera: Conjunctivae normal.     Pupils: Pupils are equal, round, and reactive to light.  Cardiovascular:     Rate and Rhythm: Normal rate and regular rhythm.     Pulses: Normal pulses.     Heart sounds: Normal heart sounds.  Pulmonary:     Effort: Pulmonary effort is normal.     Breath sounds: Normal breath sounds.  Musculoskeletal:        General: Normal range of motion.     Cervical back: Normal range of motion and neck supple.  Neurological:     General: No focal deficit present.     Mental Status: She is alert and oriented to person, place, and time.  Psychiatric:        Mood and Affect: Mood normal.        Behavior: Behavior normal.    ASSESSMENT AND PLAN: 1. Weight gain (Primary) - Patient lost 8 pounds since previous office visit.  - Increase Tirzepatide  from 5 mg to 7.5 mg as prescribed. Counseled on medication adherence/adverse effects.  - Follow-up with primary provider in 4 weeks or sooner if needed.  - tirzepatide  7.5 MG/0.5ML injection vial; Inject 7.5 mg into  the skin once a week.  Dispense: 3 mL; Refill: 0   Patient was given the opportunity to ask questions.  Patient verbalized understanding of the plan and was able to repeat key elements of the plan. Patient was given clear instructions to go to Emergency Department or return to medical center if symptoms don't improve, worsen, or new problems develop.The patient verbalized understanding.   Requested Prescriptions   Signed Prescriptions Disp Refills   tirzepatide  7.5 MG/0.5ML injection vial 3 mL 0    Sig: Inject 7.5 mg into the skin once a week.    Return in about 4 weeks (around 04/22/2024) for Follow-Up or next available weight check.  Greig JINNY Drones, NP

## 2024-03-30 NOTE — Progress Notes (Unsigned)
 Cardiology Clinic Note   Patient Name: Debra Pruitt Date of Encounter: 04/01/2024  Primary Care Provider:  Lorren Greig PARAS, NP Primary Cardiologist:  None  Patient Profile    Debra Pruitt 33 year old female presents to the clinic today for follow-up evaluation of her abnormal EKG.  Past Medical History    Past Medical History:  Diagnosis Date   Anemia    Angio-edema    Anxiety    Bacterial vaginosis    Low back pain    Mild persistent asthma 07/28/2021   Ovarian cyst    Peripheral neuropathy    Urticaria    Vitamin D  deficiency    Past Surgical History:  Procedure Laterality Date   CESAREAN SECTION  04/30/2013   x 2   MULTIPLE TOOTH EXTRACTIONS     SALPINGECTOMY Right    for ectopic pregnancy    Allergies  Allergies  Allergen Reactions   Ashwagandha-Rhodiola Hives   Mangifera Indica Swelling   Mango Flavoring Agent (Non-Screening) Swelling   Pineapple Swelling   Other Hives, Itching and Rash    Goli Nutrition supplement    History of Present Illness    Debra Pruitt has a PMH of generalized anxiety disorder, IBS, neuropathy, migraine headaches, mild persistent asthma, idiopathic epilepsy, and RBBB.  She reports a family history of heart disease with a paternal grandmother that had congestive heart failure and hypertension and maternal grandmother with hypertension.  Her mom also has hypertension.  She reports that her maternal grandfather had PPM.  She was referred for evaluation by her PCP due to chest discomfort/tightness and abnormal EKG.  She presented to the clinic 02/22/24 for evaluation and stated she had been diagnosed with asthma recently and had been taking nebulizer treatments.  She was concerned that this was not helping her chest discomfort.  She reported episodes of chest discomfort at night with racing heart rate.  She described her discomfort as a heaviness.  She had a chest x-ray that was unremarkable.  Her EKG showed sinus rhythm with  incomplete right bundle branch block 70 bpm.  We reviewed options for testing and further evaluation.  She expressed understanding.  I ordered CBC, BMP, magnesium, echocardiogram, and exercise Myoview stress test.  Her lab work was unremarkable.  Her echocardiogram showed normal LV function, normal diastolic parameters, trivial mitral valve regurgitation and no other significant valvular abnormalities.  Trivial pericardial effusion was also noted.  She presents to the clinic today for follow-up evaluation and states she is noticing more frequent episodes of chest discomfort.  She presents with her husband who notes that with the episodes it is harder for her to exhale.  She notes that she has been having episodes intermittently since 2022 after having COVID infection.  She briefly took steroid inhalers and had as needed albuterol  which did not help with her symptoms.  We reviewed her recent blood work and echocardiogram.  She expressed understanding.  I will proceed to exercise stress testing for further prognostication.  We will plan follow-up in 1 to 2 months.  Today she denies  lower extremity edema, fatigue, palpitations, melena, hematuria, hemoptysis, diaphoresis, weakness, presyncope, syncope, orthopnea, and PND.   Home Medications    Prior to Admission medications   Medication Sig Start Date End Date Taking? Authorizing Provider  albuterol  (VENTOLIN  HFA) 108 (90 Base) MCG/ACT inhaler Inhale 1-2 puffs into the lungs every 6 (six) hours as needed. Patient not taking: Reported on 02/19/2024 05/27/23   Debra Pruitt, Debra R, NP  albuterol  (VENTOLIN  HFA) 108 (90 Base) MCG/ACT inhaler Inhale 2 puffs into the lungs every 6 (six) hours as needed for wheezing or shortness of breath. Patient not taking: Reported on 10/04/2023 06/21/23   Debra Shaver, PA-C  FLUoxetine  (PROZAC ) 10 MG capsule TAKE 1 TABLET BY MOUTH EVERY DAY Patient not taking: Reported on 02/19/2024 01/01/24   Debra Greig PARAS, NP  hydrOXYzine   (VISTARIL ) 25 MG capsule Take 1 capsule (25 mg total) by mouth every 8 (eight) hours as needed. 07/02/23   Debra Greig PARAS, NP  linaclotide  (LINZESS ) 145 MCG CAPS capsule Take 1 capsule (145 mcg total) by mouth daily before breakfast. Patient not taking: Reported on 01/14/2024 11/28/22   Debra Glendia BRAVO, MD  meloxicam  (MOBIC ) 15 MG tablet Take 1 tablet (15 mg total) by mouth daily. Patient not taking: Reported on 10/04/2023 09/05/23 09/04/24  Pruitt, Debra E, NP  Multiple Vitamin (MULTIVITAMIN ADULT PO) Take by mouth.    [provider]  norethindrone  (AYGESTIN ) 5 MG tablet TAKE 1 TABLET (5 MG TOTAL) BY MOUTH DAILY. Patient not taking: Reported on 01/14/2024 01/07/24   Ajewole, Christana, MD  phentermine  37.5 MG capsule Take 1 capsule (37.5 mg total) by mouth every morning. Patient not taking: Reported on 01/14/2024 12/05/23   Debra Greig PARAS, NP  polycarbophil (FIBERCON) 625 MG tablet Take 625 mg by mouth daily. Patient not taking: Reported on 01/14/2024    [provider]  polyethylene glycol (MIRALAX / GLYCOLAX) 17 g packet Take 17 g by mouth daily. Patient not taking: Reported on 01/14/2024    [provider]  predniSONE  (DELTASONE ) 20 MG tablet Take 1 tablet (20 mg total) by mouth daily with breakfast. Patient not taking: Reported on 01/14/2024 06/21/23   Drubel, Shaver, PA-C  semaglutide -weight management (WEGOVY ) 0.25 MG/0.5ML SOAJ SQ injection Inject 0.25 mg into the skin once a week. Patient not taking: Reported on 01/14/2024 01/10/24   Debra Greig PARAS, NP  tirzepatide  (ZEPBOUND ) 2.5 MG/0.5ML injection vial Inject 2.5 mg into the skin once a week. 02/01/24   Debra Greig PARAS, NP  tirzepatide  5 MG/0.5ML injection vial Inject 5 mg into the skin once a week. 02/29/24   Debra Greig PARAS, NP  triazolam  (HALCION ) 0.25 MG tablet SMARTSIG:1 Tablet(s) By Mouth Patient not taking: Reported on 01/14/2024 10/31/23   [provider]    Family History    Family History  Problem  Relation Age of Onset   Hypertension Mother    Thyroid  disease Mother    HIV Father    Cirrhosis Father    Breast cancer Maternal Aunt    Breast cancer Maternal Grandmother    Asthma Maternal Grandmother    Hypertension Maternal Grandfather    Heart disease Maternal Grandfather    Diabetes Maternal Grandfather    Hypertension Paternal Grandmother    COPD Paternal Grandmother    Heart disease Paternal Grandmother    Hyperlipidemia Paternal Grandmother    Stroke Paternal Grandmother    Diabetes Paternal Grandmother    Hepatitis C Paternal Grandmother    Rheum arthritis Paternal Grandmother    Colon cancer Neg Hx    Esophageal cancer Neg Hx    Stomach cancer Neg Hx    She indicated that her mother is alive. She indicated that her father is deceased. She indicated that the status of her maternal grandmother is unknown. She indicated that the status of her maternal grandfather is unknown. She indicated that her paternal grandmother is deceased. She indicated that the status of  her maternal aunt is unknown. She indicated that the status of her neg hx is unknown.  Social History    Social History   Socioeconomic History   Marital status: Married    Spouse name: Not on file   Number of children: 2   Years of education: Not on file   Highest education level: Some college, no degree  Occupational History   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  Tobacco Use   Smoking status: Never   Smokeless tobacco: Never  Vaping Use   Vaping status: Never Used  Substance and Sexual Activity   Alcohol use: Yes    Alcohol/week: 0.0 standard drinks of alcohol    Comment: Rare - social   Drug use: No   Sexual activity: Yes    Birth control/protection: None  Other Topics Concern   Not on file  Social History Narrative   Not on file   Social Drivers of Health   Financial Resource Strain: Low Risk  (06/21/2023)   Overall Financial Resource Strain (CARDIA)    Difficulty  of Paying Living Expenses: Not hard at all  Food Insecurity: No Food Insecurity (01/14/2024)   Hunger Vital Sign    Worried About Running Out of Food in the Last Year: Never true    Ran Out of Food in the Last Year: Never true  Transportation Needs: No Transportation Needs (10/04/2023)   PRAPARE - Administrator, Civil Service (Medical): No    Lack of Transportation (Non-Medical): No  Physical Activity: Insufficiently Active (06/21/2023)   Exercise Vital Sign    Days of Exercise per Week: 3 days    Minutes of Exercise per Session: 20 min  Stress: No Stress Concern Present (06/21/2023)   [employer] of Occupational Health - Occupational Stress Questionnaire    Feeling of Stress : Only a little  Social Connections: Moderately Integrated (06/21/2023)   Social Connection and Isolation Panel    Frequency of Communication with Friends and Family: More than three times a week    Frequency of Social Gatherings with Friends and Family: Once a week    Attends Religious Services: 1 to 4 times per year    Active Member of [employer] or Organizations: No    Attends Engineer, Structural: Not on file    Marital Status: Married  Catering Manager Violence: Not At Risk (12/05/2023)   Humiliation, Afraid, Rape, and Kick questionnaire    Fear of Current or Ex-Partner: No    Emotionally Abused: No    Physically Abused: No    Sexually Abused: No     Review of Systems    General:  No chills, fever, night sweats or weight changes.  Cardiovascular:  No chest pain, dyspnea on exertion, edema, orthopnea, palpitations, paroxysmal nocturnal dyspnea. Dermatological: No rash, lesions/masses Respiratory: No cough, dyspnea Urologic: No hematuria, dysuria Abdominal:   No nausea, vomiting, diarrhea, bright red blood per rectum, melena, or hematemesis Neurologic:  No visual changes, wkns, changes in mental status. All other systems reviewed and are otherwise negative except as noted  above.  Physical Exam    VS:  BP 118/80   Pulse 86   Ht 5' 6 (1.676 m)   Wt 163 lb (73.9 kg)   LMP 03/15/2024 (Exact Date)   SpO2 99%   BMI 26.31 kg/m  , BMI Body mass index is 26.31 kg/m. GEN: Well nourished, well developed, in no acute distress. HEENT: normal. Neck: Supple, no JVD, carotid bruits,  or masses. Cardiac: RRR, no murmurs, rubs, or gallops. No clubbing, cyanosis, edema.  Radials/DP/PT 2+ and equal bilaterally.  Respiratory:  Respirations regular and unlabored, clear to auscultation bilaterally. GI: Soft, nontender, nondistended, BS + x 4. MS: no deformity or atrophy. Skin: warm and dry, no rash. Neuro:  Strength and sensation are intact. Psych: Normal affect.  Accessory Clinical Findings    Recent Labs: 08/21/2023: ALT 10 01/10/2024: TSH 1.530 02/22/2024: BUN 12; Creatinine, Ser 0.85; Hemoglobin 12.4; Magnesium 2.1; Platelets 223; Potassium 4.7; Sodium 139   Recent Lipid Panel    Component Value Date/Time   CHOL 155 08/28/2023 1133   TRIG 78 08/28/2023 1133   HDL 69 08/28/2023 1133   CHOLHDL 2.2 08/28/2023 1133   CHOLHDL 3 01/08/2020 1435   VLDL 9.6 01/08/2020 1435   LDLCALC 71 08/28/2023 1133         ECG personally reviewed by me today-none today.    EKG 02/19/2024 Normal sinus rhythm with incomplete right bundle branch block 70 bpm  Echocardiogram 03/11/2024  IMPRESSIONS     1. Left ventricular ejection fraction, by estimation, is 65 to 70%. Left  ventricular ejection fraction by 3D volume is 69 %. The left ventricle has  normal function. The left ventricle has no regional wall motion  abnormalities. Left ventricular diastolic   parameters were normal. The average left ventricular global longitudinal  strain is -24.2 %. The global longitudinal strain is normal.   2. Right ventricular systolic function is normal. The right ventricular  size is normal. There is normal pulmonary artery systolic pressure. The  estimated right ventricular  systolic pressure is 14.8 mmHg.   3. The mitral valve is normal in structure. Trivial mitral valve  regurgitation. No evidence of mitral stenosis.   4. The aortic valve is normal in structure. Aortic valve regurgitation is  not visualized. No aortic stenosis is present.   5. The inferior vena cava is normal in size with greater than 50%  respiratory variability, suggesting right atrial pressure of 3 mmHg.   FINDINGS   Left Ventricle: Left ventricular ejection fraction, by estimation, is 65  to 70%. Left ventricular ejection fraction by 3D volume is 69 %. The left  ventricle has normal function. The left ventricle has no regional wall  motion abnormalities. The average  left ventricular global longitudinal strain is -24.2 %. Strain was  performed and the global longitudinal strain is normal. The left  ventricular internal cavity size was normal in size. There is no left  ventricular hypertrophy. Left ventricular diastolic  parameters were normal. Normal left ventricular filling pressure.   Right Ventricle: The right ventricular size is normal. No increase in  right ventricular wall thickness. Right ventricular systolic function is  normal. There is normal pulmonary artery systolic pressure. The tricuspid  regurgitant velocity is 1.72 m/s, and   with an assumed right atrial pressure of 3 mmHg, the estimated right  ventricular systolic pressure is 14.8 mmHg.   Left Atrium: Left atrial size was normal in size.   Right Atrium: Right atrial size was normal in size.   Pericardium: Trivial pericardial effusion is present. The pericardial  effusion is anterior to the right ventricle.   Mitral Valve: The mitral valve is normal in structure. Trivial mitral  valve regurgitation. No evidence of mitral valve stenosis.   Tricuspid Valve: The tricuspid valve is normal in structure. Tricuspid  valve regurgitation is trivial. No evidence of tricuspid stenosis.   Aortic Valve: The aortic valve is  normal in structure. Aortic valve  regurgitation is not visualized. No aortic stenosis is present.   Pulmonic Valve: The pulmonic valve was normal in structure. Pulmonic valve  regurgitation is not visualized. No evidence of pulmonic stenosis.   Aorta: The aortic root is normal in size and structure.   Venous: The inferior vena cava is normal in size with greater than 50%  respiratory variability, suggesting right atrial pressure of 3 mmHg.   IAS/Shunts: No atrial level shunt detected by color flow Doppler.    Assessment & Plan   1.  Abnormal EKG, chest discomfort, RBBB-EKG 02/19/2024 shows sinus rhythm with incomplete right bundle branch block 70 bpm.  Denies lightheadedness, presyncope or syncope.  Reports that she has occasional episodes of chest tightness which she notices at night with increased/racing heart rate.  She describes the episodes as a heaviness in her chest. Exercise Myoview stress test  Informed Consent   Shared Decision Making/Informed Consent The risks [chest pain, shortness of breath, cardiac arrhythmias, dizziness, blood pressure fluctuations, myocardial infarction, stroke/transient ischemic attack, and life-threatening complications (estimated to be 1 in 10,000)], benefits (risk stratification, diagnosing coronary artery disease, treatment guidance) and alternatives of an exercise tolerance test were discussed in detail with Debra Pruitt and she agrees to proceed.      Asthma-reports compliance with albuterol  and nebulizer treatments.   Encouraged regular physical activity Continue current medical therapy  Anxiety-mood stable in clinic today.  Concerned about episodes of chest tightness. Continue fluoxetine , hydroxyzine  Follows with PCP  Disposition: Follow-up with Dr. Loni or me in 2 months.   Josefa HERO. Julian Medina NP-C     04/01/2024, 12:06 PM [facility] [address] [place], [place] [address] Office [phone]     Notice: This dictation was prepared with Dragon dictation along with smaller phrase technology. Any transcriptional errors that result from this process are unintentional and may not be corrected upon review.   I spent 14 minutes examining this patient, reviewing medications, and using patient centered shared decision making involving their cardiac care.   I spent  20 minutes reviewing past medical history,  medications, and prior cardiac tests.

## 2024-03-31 ENCOUNTER — Encounter: Payer: Self-pay | Admitting: Radiology

## 2024-04-01 ENCOUNTER — Telehealth: Payer: Self-pay

## 2024-04-01 ENCOUNTER — Encounter: Payer: Self-pay | Admitting: General Practice

## 2024-04-01 ENCOUNTER — Ambulatory Visit: Attending: General Practice | Admitting: General Practice

## 2024-04-01 VITALS — BP 118/80 | HR 86 | Ht 66.0 in | Wt 163.0 lb

## 2024-04-01 DIAGNOSIS — J4530 Mild persistent asthma, uncomplicated: Secondary | ICD-10-CM | POA: Diagnosis not present

## 2024-04-01 DIAGNOSIS — F419 Anxiety disorder, unspecified: Secondary | ICD-10-CM | POA: Diagnosis not present

## 2024-04-01 DIAGNOSIS — R0789 Other chest pain: Secondary | ICD-10-CM

## 2024-04-01 DIAGNOSIS — R9431 Abnormal electrocardiogram [ECG] [EKG]: Secondary | ICD-10-CM

## 2024-04-01 DIAGNOSIS — R072 Precordial pain: Secondary | ICD-10-CM

## 2024-04-01 MED ORDER — METOPROLOL TARTRATE 100 MG PO TABS: ORAL_TABLET | ORAL | 0 refills | Status: DC

## 2024-04-01 NOTE — Telephone Encounter (Signed)
-----   Message from Debra Pruitt sent at 04/01/2024  1:29 PM EST ----- Lets go ahead and cancel exercise Myoview and schedule coronary CTA.  Dr. Acharya advised this.  Thank you for your help.  Debra CHRISTELLA. Cleaver NP-C  [image]   04/01/2024, 1:31 PM [facility] [address] [place], [place] [address] Office [phone] ----- Message ----- From: Loni Soyla LABOR, MD Sent: 04/01/2024   1:16 PM EST To: Debra CHRISTELLA Beauvais, NP  Would get a coronary cta on her instead unless contraindication. Will get a better look at cardiac anatomy and much lower dose of radiation if done at [facility].  [place] ----- Message ----- From: Pruitt Debra CHRISTELLA, NP Sent: 04/01/2024  12:08 PM EST To: Soyla LABOR Loni, MD

## 2024-04-01 NOTE — Telephone Encounter (Signed)
 Reviewed the instructions with the patient. All questions if any were answered. Copy of instructions has been sent to the patient via MyChart.   Stress test has been cancelled.          Your cardiac CT will be scheduled at one of the below locations:   [facility] [address]  [place], [place] [address]   If scheduled at the [facility] at [address], please enter the parking lot using the [address] entrance and use the FREE valet service at the patient drop-off area. Enter the building and check-in with registration on the main floor.  Please follow these instructions carefully (unless otherwise directed):  An IV will be required for this test and Nitroglycerin will be given.  Hold all erectile dysfunction medications at least 3 days (72 hrs) prior to test. (Ie viagra, cialis, sildenafil, tadalafil, etc)   On the Night Before the Test: Be sure to Drink plenty of water. Do not consume any caffeinated/decaffeinated beverages or chocolate 12 hours prior to your test. Do not take any antihistamines 12 hours prior to your test.  On the Day of the Test: Drink plenty of water until 1 hour prior to the test. Do not eat any food 1 hour prior to test. You may take your regular medications prior to the test.  Take metoprolol (Lopressor) two hours prior to test. If you take Furosemide/Hydrochlorothiazide/Spironolactone/Chlorthalidone, please HOLD on the morning of the test. Patients who wear a continuous glucose monitor MUST remove the device prior to scanning. FEMALES- please wear underwire-free bra if available, avoid dresses & tight clothing      After the Test: Drink plenty of water. After receiving IV contrast, you may experience a mild flushed feeling. This is normal. On occasion, you may experience a mild rash up to 24 hours after the test. This is not dangerous. If this occurs, you can take Benadryl 25 mg, Zyrtec , Claritin,  or Allegra and increase your fluid intake. (Patients taking Tikosyn should avoid Benadryl, and may take Zyrtec , Claritin, or Allegra) If you experience trouble breathing, this can be serious. If it is severe call 911 IMMEDIATELY. If it is mild, please call our office.  We will call to schedule your test 2-4 weeks out understanding that some insurance companies will need an authorization prior to the service being performed.   For more information and frequently asked questions, please visit our website : [email or web address]  For non-scheduling related questions, please contact the cardiac imaging nurse navigator should you have any questions/concerns: Cardiac Imaging Nurse Navigators Direct Office Dial: [phone]   For scheduling needs, including cancellations and rescheduling, please call Brittany, [phone].

## 2024-04-01 NOTE — Patient Instructions (Addendum)
 Medication Instructions:  Your physician recommends that you continue on your current medications as directed. Please refer to the Current Medication list given to you today. *If you need a refill on your cardiac medications before your next appointment, please call your pharmacy*  Lab Work: None ordered If you have labs (blood work) drawn today and your tests are completely normal, you will receive your results only by: MyChart Message (if you have MyChart) OR A paper copy in the mail If you have any lab test that is abnormal or we need to change your treatment, we will call you to review the results.  Testing/Procedures: Your physician has requested that you have an exercise stress myoview. For further information please visit [email or web address] Please follow instruction sheet, as given.   Follow-Up: At [facility], you and your health needs are our priority.  As part of our continuing mission to provide you with exceptional heart care, our providers are all part of one team.  This team includes your primary Cardiologist (physician) and Advanced Practice Providers or APPs (Physician Assistants and Nurse Practitioners) who all work together to provide you with the care you need, when you need it.  Your next appointment:   1-2 month(s)  Provider:   Josefa Beauvais, NP or ANY APP   We recommend signing up for the patient portal called MyChart.  Sign up information is provided on this After Visit Summary.  MyChart is used to connect with patients for Virtual Visits (Telemedicine).  Patients are able to view lab/test results, encounter notes, upcoming appointments, etc.  Non-urgent messages can be sent to your provider as well.   To learn more about what you can do with MyChart, go to [email or web address].   Other Instructions

## 2024-04-08 ENCOUNTER — Ambulatory Visit (HOSPITAL_COMMUNITY)

## 2024-04-10 ENCOUNTER — Ambulatory Visit: Admitting: Pulmonary Disease

## 2024-04-10 ENCOUNTER — Encounter: Payer: Self-pay | Admitting: Pulmonary Disease

## 2024-04-16 ENCOUNTER — Encounter: Payer: Self-pay | Admitting: Family

## 2024-04-23 NOTE — Telephone Encounter (Signed)
 Patient said she will wait to talk with pcp before taking another dose... appt  04/28/2024

## 2024-04-28 NOTE — Telephone Encounter (Signed)
 Recommendation to hold Zepbound  presently. During the interim report to the Emergency Department/Urgent Care/call 911 for immediate medical evaluation. Follow-up with Primary Care.

## 2024-04-29 ENCOUNTER — Encounter: Payer: Self-pay | Admitting: Family

## 2024-04-29 ENCOUNTER — Other Ambulatory Visit: Payer: Self-pay | Admitting: Family

## 2024-04-29 ENCOUNTER — Ambulatory Visit: Admitting: Family

## 2024-04-29 ENCOUNTER — Telehealth: Payer: Self-pay

## 2024-04-29 VITALS — BP 113/78 | HR 74 | Temp 98.6°F | Resp 16 | Ht 66.0 in | Wt 156.8 lb

## 2024-04-29 DIAGNOSIS — R635 Abnormal weight gain: Secondary | ICD-10-CM

## 2024-04-29 MED ORDER — TIRZEPATIDE-WEIGHT MANAGEMENT 10 MG/0.5ML ~~LOC~~ SOLN: 10.0000 mg | SUBCUTANEOUS | 0 refills | Status: DC

## 2024-04-29 NOTE — Progress Notes (Unsigned)
 Cardiology Clinic Note   Patient Name: Debra Pruitt Date of Encounter: 05/01/2024  Primary Care Provider:  Jaycee Greig PARAS, NP Primary Cardiologist:  None  Patient Profile    Debra Pruitt 33 year old female presents to the clinic today for follow-up evaluation of her abnormal EKG.  Past Medical History    Past Medical History:  Diagnosis Date   Anemia    Angio-edema    Anxiety    Bacterial vaginosis    Low back pain    Mild persistent asthma 07/28/2021   Ovarian cyst    Peripheral neuropathy    Urticaria    Vitamin D  deficiency    Past Surgical History:  Procedure Laterality Date   CESAREAN SECTION  04/30/2013   x 2   MULTIPLE TOOTH EXTRACTIONS     SALPINGECTOMY Right    for ectopic pregnancy    Allergies  Allergies  Allergen Reactions   Ashwagandha-Rhodiola Hives   Mangifera Indica Swelling   Mango Flavoring Agent (Non-Screening) Swelling   Pineapple Swelling   Other Hives, Itching and Rash    Goli Nutrition supplement    History of Present Illness    Debra Pruitt has a PMH of generalized anxiety disorder, IBS, neuropathy, migraine headaches, mild persistent asthma, idiopathic epilepsy, and RBBB.  She reports a family history of heart disease with a paternal grandmother that had congestive heart failure and hypertension and maternal grandmother with hypertension.  Her mom also has hypertension.  She reports that her maternal grandfather had PPM.  She was referred for evaluation by her PCP due to chest discomfort/tightness and abnormal EKG.  She presented to the clinic 02/22/24 for evaluation and stated she had been diagnosed with asthma recently and had been taking nebulizer treatments.  She was concerned that this was not helping her chest discomfort.  She reported episodes of chest discomfort at night with racing heart rate.  She described her discomfort as a heaviness.  She had a chest x-ray that was unremarkable.  Her EKG showed sinus rhythm with  incomplete right bundle branch block 70 bpm.  We reviewed options for testing and further evaluation.  She expressed understanding.  I ordered CBC, BMP, magnesium, echocardiogram, and exercise Myoview stress test.  Her lab work was unremarkable.  Her echocardiogram showed normal LV function, normal diastolic parameters, trivial mitral valve regurgitation and no other significant valvular abnormalities.  Trivial pericardial effusion was also noted.  She presented to the clinic 04/01/24 for follow-up evaluation and stated she was noticing more frequent episodes of chest discomfort.  She presented with her husband who noted that with the episodes it was harder for her to exhale.  She noted that she had been having episodes intermittently since 2022 after having COVID infection.  She briefly took steroid inhalers and had as needed albuterol  which did not help with her symptoms.  We reviewed her recent blood work and echocardiogram.  She expressed understanding.  I ordered exercise stress testing for further prognostication.   follow-up in 1 to 2 months was planned.   Her myocardial perfusion study was canceled.  She presents to the clinic today for follow-up evaluation and states she noticed that her chest heaviness and shortness of breath were most severe after running through [place] airport to catch a flight recently.  We reviewed her recommendations for coronary CTA.  She expressed understanding.  Given her blood pressure of 90/62 I would recommend that she only take 12-1/2 mg of metoprolol  tartrate prior to CTA.  I  will order CBC and BMP.  I have encouraged her to continue to do light physical activity and we will plan follow-up after testing.  Today she denies  lower extremity edema, fatigue, palpitations, melena, hematuria, hemoptysis, diaphoresis, weakness, presyncope, syncope, orthopnea, and PND.   Home Medications    Prior to Admission medications   Medication Sig Start Date End Date Taking?  Authorizing Provider  albuterol  (VENTOLIN  HFA) 108 (90 Base) MCG/ACT inhaler Inhale 1-2 puffs into the lungs every 6 (six) hours as needed. Patient not taking: Reported on 02/19/2024 05/27/23   Mayer, Jodi R, NP  albuterol  (VENTOLIN  HFA) 108 (90 Base) MCG/ACT inhaler Inhale 2 puffs into the lungs every 6 (six) hours as needed for wheezing or shortness of breath. Patient not taking: Reported on 10/04/2023 06/21/23   Cyndi Shaver, PA-C  FLUoxetine  (PROZAC ) 10 MG capsule TAKE 1 TABLET BY MOUTH EVERY DAY Patient not taking: Reported on 02/19/2024 01/01/24   Lorren Greig PARAS, NP  hydrOXYzine  (VISTARIL ) 25 MG capsule Take 1 capsule (25 mg total) by mouth every 8 (eight) hours as needed. 07/02/23   Lorren Greig PARAS, NP  linaclotide  (LINZESS ) 145 MCG CAPS capsule Take 1 capsule (145 mcg total) by mouth daily before breakfast. Patient not taking: Reported on 01/14/2024 11/28/22   Stacia Glendia BRAVO, MD  meloxicam  (MOBIC ) 15 MG tablet Take 1 tablet (15 mg total) by mouth daily. Patient not taking: Reported on 10/04/2023 09/05/23 09/04/24  Williams, Megan E, NP  Multiple Vitamin (MULTIVITAMIN ADULT PO) Take by mouth.    [provider]  norethindrone  (AYGESTIN ) 5 MG tablet TAKE 1 TABLET (5 MG TOTAL) BY MOUTH DAILY. Patient not taking: Reported on 01/14/2024 01/07/24   Ajewole, Christana, MD  phentermine  37.5 MG capsule Take 1 capsule (37.5 mg total) by mouth every morning. Patient not taking: Reported on 01/14/2024 12/05/23   Lorren Greig PARAS, NP  polycarbophil (FIBERCON) 625 MG tablet Take 625 mg by mouth daily. Patient not taking: Reported on 01/14/2024    [provider]  polyethylene glycol (MIRALAX / GLYCOLAX) 17 g packet Take 17 g by mouth daily. Patient not taking: Reported on 01/14/2024    [provider]  predniSONE  (DELTASONE ) 20 MG tablet Take 1 tablet (20 mg total) by mouth daily with breakfast. Patient not taking: Reported on 01/14/2024 06/21/23   Cyndi Shaver, PA-C  semaglutide -weight  management (WEGOVY ) 0.25 MG/0.5ML SOAJ SQ injection Inject 0.25 mg into the skin once a week. Patient not taking: Reported on 01/14/2024 01/10/24   Lorren Greig PARAS, NP  tirzepatide  (ZEPBOUND ) 2.5 MG/0.5ML injection vial Inject 2.5 mg into the skin once a week. 02/01/24   Lorren Greig PARAS, NP  tirzepatide  5 MG/0.5ML injection vial Inject 5 mg into the skin once a week. 02/29/24   Lorren Greig PARAS, NP  triazolam  (HALCION ) 0.25 MG tablet SMARTSIG:1 Tablet(s) By Mouth Patient not taking: Reported on 01/14/2024 10/31/23   [provider]    Family History    Family History  Problem Relation Age of Onset   Hypertension Mother    Thyroid  disease Mother    HIV Father    Cirrhosis Father    Breast cancer Maternal Aunt    Breast cancer Maternal Grandmother    Asthma Maternal Grandmother    Hypertension Maternal Grandfather    Heart disease Maternal Grandfather    Diabetes Maternal Grandfather    Hypertension Paternal Grandmother    COPD Paternal Grandmother    Heart disease Paternal Grandmother    Hyperlipidemia Paternal  Grandmother    Stroke Paternal Grandmother    Diabetes Paternal Grandmother    Hepatitis C Paternal Grandmother    Rheum arthritis Paternal Grandmother    Colon cancer Neg Hx    Esophageal cancer Neg Hx    Stomach cancer Neg Hx    She indicated that her mother is alive. She indicated that her father is deceased. She indicated that the status of her maternal grandmother is unknown. She indicated that the status of her maternal grandfather is unknown. She indicated that her paternal grandmother is deceased. She indicated that the status of her maternal aunt is unknown. She indicated that the status of her neg hx is unknown.  Social History    Social History   Socioeconomic History   Marital status: Married    Spouse name: Not on file   Number of children: 2   Years of education: Not on file   Highest education level: Some college, no degree  Occupational History    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  Tobacco Use   Smoking status: Never   Smokeless tobacco: Never  Vaping Use   Vaping status: Never Used  Substance and Sexual Activity   Alcohol use: Yes    Alcohol/week: 0.0 standard drinks of alcohol    Comment: Rare - social   Drug use: No   Sexual activity: Yes    Birth control/protection: None  Other Topics Concern   Not on file  Social History Narrative   Not on file   Social Drivers of Health   Financial Resource Strain: Low Risk  (04/29/2024)   Overall Financial Resource Strain (CARDIA)    Difficulty of Paying Living Expenses: Not hard at all  Food Insecurity: No Food Insecurity (04/29/2024)   Hunger Vital Sign    Worried About Running Out of Food in the Last Year: Never true    Ran Out of Food in the Last Year: Never true  Transportation Needs: No Transportation Needs (04/29/2024)   PRAPARE - Administrator, Civil Service (Medical): No    Lack of Transportation (Non-Medical): No  Physical Activity: Insufficiently Active (04/29/2024)   Exercise Vital Sign    Days of Exercise per Week: 2 days    Minutes of Exercise per Session: 30 min  Stress: Stress Concern Present (04/29/2024)   [employer] of Occupational Health - Occupational Stress Questionnaire    Feeling of Stress: To some extent  Social Connections: Moderately Integrated (04/29/2024)   Social Connection and Isolation Panel    Frequency of Communication with Friends and Family: Three times a week    Frequency of Social Gatherings with Friends and Family: Once a week    Attends Religious Services: 1 to 4 times per year    Active Member of [employer] or Organizations: No    Attends Engineer, Structural: Not on file    Marital Status: Married  Catering Manager Violence: Not At Risk (12/05/2023)   Humiliation, Afraid, Rape, and Kick questionnaire    Fear of Current or Ex-Partner: No    Emotionally Abused: No    Physically Abused: No     Sexually Abused: No     Review of Systems    General:  No chills, fever, night sweats or weight changes.  Cardiovascular:  No chest pain, dyspnea on exertion, edema, orthopnea, palpitations, paroxysmal nocturnal dyspnea. Dermatological: No rash, lesions/masses Respiratory: No cough, dyspnea Urologic: No hematuria, dysuria Abdominal:   No nausea, vomiting, diarrhea, bright red  blood per rectum, melena, or hematemesis Neurologic:  No visual changes, wkns, changes in mental status. All other systems reviewed and are otherwise negative except as noted above.  Physical Exam    VS:  BP 90/62 (BP Location: Left Arm, Patient Position: Sitting, Cuff Size: Normal)   Pulse 76   Ht 5' 6 (1.676 m)   Wt 158 lb (71.7 kg)   LMP 04/19/2024 (Exact Date)   BMI 25.50 kg/m  , BMI Body mass index is 25.5 kg/m. GEN: Well nourished, well developed, in no acute distress. HEENT: normal. Neck: Supple, no JVD, carotid bruits, or masses. Cardiac: RRR, no murmurs, rubs, or gallops. No clubbing, cyanosis, edema.  Radials/DP/PT 2+ and equal bilaterally.  Respiratory:  Respirations regular and unlabored, clear to auscultation bilaterally. GI: Soft, nontender, nondistended, BS + x 4. MS: no deformity or atrophy. Skin: warm and dry, no rash. Neuro:  Strength and sensation are intact. Psych: Normal affect.  Accessory Clinical Findings    Recent Labs: 08/21/2023: ALT 10 01/10/2024: TSH 1.530 02/22/2024: BUN 12; Creatinine, Ser 0.85; Hemoglobin 12.4; Magnesium 2.1; Platelets 223; Potassium 4.7; Sodium 139   Recent Lipid Panel    Component Value Date/Time   CHOL 155 08/28/2023 1133   TRIG 78 08/28/2023 1133   HDL 69 08/28/2023 1133   CHOLHDL 2.2 08/28/2023 1133   CHOLHDL 3 01/08/2020 1435   VLDL 9.6 01/08/2020 1435   LDLCALC 71 08/28/2023 1133         ECG personally reviewed by me today-none today.    EKG 02/19/2024 Normal sinus rhythm with incomplete right bundle branch block 70  bpm  Echocardiogram 03/11/2024  IMPRESSIONS     1. Left ventricular ejection fraction, by estimation, is 65 to 70%. Left  ventricular ejection fraction by 3D volume is 69 %. The left ventricle has  normal function. The left ventricle has no regional wall motion  abnormalities. Left ventricular diastolic   parameters were normal. The average left ventricular global longitudinal  strain is -24.2 %. The global longitudinal strain is normal.   2. Right ventricular systolic function is normal. The right ventricular  size is normal. There is normal pulmonary artery systolic pressure. The  estimated right ventricular systolic pressure is 14.8 mmHg.   3. The mitral valve is normal in structure. Trivial mitral valve  regurgitation. No evidence of mitral stenosis.   4. The aortic valve is normal in structure. Aortic valve regurgitation is  not visualized. No aortic stenosis is present.   5. The inferior vena cava is normal in size with greater than 50%  respiratory variability, suggesting right atrial pressure of 3 mmHg.   FINDINGS   Left Ventricle: Left ventricular ejection fraction, by estimation, is 65  to 70%. Left ventricular ejection fraction by 3D volume is 69 %. The left  ventricle has normal function. The left ventricle has no regional wall  motion abnormalities. The average  left ventricular global longitudinal strain is -24.2 %. Strain was  performed and the global longitudinal strain is normal. The left  ventricular internal cavity size was normal in size. There is no left  ventricular hypertrophy. Left ventricular diastolic  parameters were normal. Normal left ventricular filling pressure.   Right Ventricle: The right ventricular size is normal. No increase in  right ventricular wall thickness. Right ventricular systolic function is  normal. There is normal pulmonary artery systolic pressure. The tricuspid  regurgitant velocity is 1.72 m/s, and   with an assumed right atrial  pressure of 3  mmHg, the estimated right  ventricular systolic pressure is 14.8 mmHg.   Left Atrium: Left atrial size was normal in size.   Right Atrium: Right atrial size was normal in size.   Pericardium: Trivial pericardial effusion is present. The pericardial  effusion is anterior to the right ventricle.   Mitral Valve: The mitral valve is normal in structure. Trivial mitral  valve regurgitation. No evidence of mitral valve stenosis.   Tricuspid Valve: The tricuspid valve is normal in structure. Tricuspid  valve regurgitation is trivial. No evidence of tricuspid stenosis.   Aortic Valve: The aortic valve is normal in structure. Aortic valve  regurgitation is not visualized. No aortic stenosis is present.   Pulmonic Valve: The pulmonic valve was normal in structure. Pulmonic valve  regurgitation is not visualized. No evidence of pulmonic stenosis.   Aorta: The aortic root is normal in size and structure.   Venous: The inferior vena cava is normal in size with greater than 50%  respiratory variability, suggesting right atrial pressure of 3 mmHg.   IAS/Shunts: No atrial level shunt detected by color flow Doppler.    Assessment & Plan   1.  Abnormal EKG, chest discomfort, RBBB-no chest pain today.  Did notice chest discomfort and significant work of breathing as well as diaphoresis after rushing through [place] airport to catch a flight.  Her prior EKG 02/19/2024 shows sinus rhythm with incomplete right bundle branch block 70 bpm.  She continues to report occasional episodes of chest tightness which she notices at night with increased/racing heart rate.  She continues to describe the episodes as a heaviness in her chest.  She notes that her symptoms started post-COVID infection in 2022.   Reorder/schedule coronary CTA 12.5 mg of metoprolol  tartrate due to blood pressure. Order CBC, BMP  Asthma-continues to be compliant with medical therapy.  Previously followed with Dr. Annella.   Reports that she would like a different pulmonary provider if she needs to follow-up with pulmonology. Again, encouraged regular physical activity Continue to monitor  Anxiety-mood pleasant and appropriate today. Continue fluoxetine , hydroxyzine  Follows with PCP  Disposition: Follow-up with Dr. Loni or me after testing.  Josefa HERO. Kemuel Buchmann NP-C     05/01/2024, 10:43 AM [facility] [address] [place], [place] [address] Office [phone]    Notice: This dictation was prepared with Dragon dictation along with smaller phrase technology. Any transcriptional errors that result from this process are unintentional and may not be corrected upon review.   I spent 14 minutes examining this patient, reviewing medications, and using patient centered shared decision making involving their cardiac care.   I spent  20 minutes reviewing past medical history,  medications, and prior cardiac tests.

## 2024-04-29 NOTE — Progress Notes (Signed)
 Patient ID: Debra Pruitt, female    DOB: July 19, 1990  MRN: [id number]  CC: Weight Check  Subjective: Debra Pruitt is a 33 y.o. female who presents for weight check.   Her concerns today include:  Doing well on Tirzepatide  7.5 mg. She is watching what she eats. She exercises. States she had a red itching rash at injection site of stomach Tirzepatide  during week 3. States eventually rash went away. States she is able to tolerate rash and plans to use over-the-counter medication should symptoms return.   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   Melanocytic nevus of left lower extremity 08/30/2021   Localization-related idiopathic epilepsy and epileptic syndromes with seizures of localized onset, not intractable, without status epilepticus (HCC) 07/28/2021   Migraine headache without aura 07/28/2021   Mild persistent asthma 07/28/2021   Food intolerance 03/10/2021   Cervical radiculopathy 05/31/2020   Vitamin D  deficiency 01/09/2020     Current Outpatient Medications on File Prior to Visit  Medication Sig Dispense Refill   metoprolol  tartrate (LOPRESSOR ) 100 MG tablet Take 1 tablet two hours prior to test 1 tablet 0   Multiple Vitamin (MULTIVITAMIN ADULT PO) Take by mouth.     polyethylene glycol (MIRALAX / GLYCOLAX) 17 g packet Take 17 g by mouth daily.     tirzepatide  (ZEPBOUND ) 2.5 MG/0.5ML injection vial Inject 2.5 mg into the skin once a week. 3 mL 0   tirzepatide  5 MG/0.5ML injection vial Inject 5 mg into the skin once a week. 2 mL 0   tirzepatide  7.5 MG/0.5ML injection vial Inject 7.5 mg into the skin once a week. 3 mL 0   albuterol  (VENTOLIN  HFA) 108 (90 Base) MCG/ACT inhaler Inhale 2 puffs into the lungs every 6 (six) hours as needed for wheezing or shortness of breath. (Patient not taking: Reported on 04/01/2024) 18 g 2   phentermine  37.5 MG capsule Take 1  capsule (37.5 mg total) by mouth every morning. (Patient not taking: Reported on 04/01/2024) 30 capsule 0   No current facility-administered medications on file prior to visit.    Allergies  Allergen Reactions   Ashwagandha-Rhodiola Hives   Mangifera Indica Swelling   Mango Flavoring Agent (Non-Screening) Swelling   Pineapple Swelling   Other Hives, Itching and Rash    Goli Nutrition supplement    Social History   Socioeconomic History   Marital status: Married    Spouse name: Not on file   Number of children: 2   Years of education: Not on file   Highest education level: Some college, no degree  Occupational History   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  Tobacco Use   Smoking status: Never   Smokeless tobacco: Never  Vaping Use   Vaping status: Never Used  Substance and Sexual Activity   Alcohol use: Yes    Alcohol/week: 0.0 standard drinks of alcohol    Comment: Rare - social   Drug use: No   Sexual activity: Yes    Birth control/protection: None  Other Topics Concern   Not on file  Social History Narrative   Not on file   Social Drivers of Health   Financial Resource Strain: Low Risk  (04/29/2024)   Overall Financial Resource Strain (CARDIA)    Difficulty of Paying Living Expenses: Not hard at all  Food Insecurity: No Food Insecurity (04/29/2024)   Hunger Vital Sign  Worried About Programme Researcher, Broadcasting/film/video in the Last Year: Never true    Ran Out of Food in the Last Year: Never true  Transportation Needs: No Transportation Needs (04/29/2024)   PRAPARE - Administrator, Civil Service (Medical): No    Lack of Transportation (Non-Medical): No  Physical Activity: Insufficiently Active (04/29/2024)   Exercise Vital Sign    Days of Exercise per Week: 2 days    Minutes of Exercise per Session: 30 min  Stress: Stress Concern Present (04/29/2024)   [employer] of Occupational Health - Occupational Stress Questionnaire    Feeling of  Stress: To some extent  Social Connections: Moderately Integrated (04/29/2024)   Social Connection and Isolation Panel    Frequency of Communication with Friends and Family: Three times a week    Frequency of Social Gatherings with Friends and Family: Once a week    Attends Religious Services: 1 to 4 times per year    Active Member of [employer] or Organizations: No    Attends Engineer, Structural: Not on file    Marital Status: Married  Catering Manager Violence: Not At Risk (12/05/2023)   Humiliation, Afraid, Rape, and Kick questionnaire    Fear of Current or Ex-Partner: No    Emotionally Abused: No    Physically Abused: No    Sexually Abused: No    Family History  Problem Relation Age of Onset   Hypertension Mother    Thyroid  disease Mother    HIV Father    Cirrhosis Father    Breast cancer Maternal Aunt    Breast cancer Maternal Grandmother    Asthma Maternal Grandmother    Hypertension Maternal Grandfather    Heart disease Maternal Grandfather    Diabetes Maternal Grandfather    Hypertension Paternal Grandmother    COPD Paternal Grandmother    Heart disease Paternal Grandmother    Hyperlipidemia Paternal Grandmother    Stroke Paternal Grandmother    Diabetes Paternal Grandmother    Hepatitis C Paternal Grandmother    Rheum arthritis Paternal Grandmother    Colon cancer Neg Hx    Esophageal cancer Neg Hx    Stomach cancer Neg Hx     Past Surgical History:  Procedure Laterality Date   CESAREAN SECTION  04/30/2013   x 2   MULTIPLE TOOTH EXTRACTIONS     SALPINGECTOMY Right    for ectopic pregnancy    ROS: Review of Systems Negative except as stated above  PHYSICAL EXAM: BP 113/78   Pulse 74   Temp 98.6 F (37 C) (Oral)   Resp 16   Ht 5' 6 (1.676 m)   Wt 156 lb 12.8 oz (71.1 kg)   LMP 04/19/2024 (Exact Date)   SpO2 99%   BMI 25.31 kg/m   Wt Readings from Last 3 Encounters:  04/29/24 156 lb 12.8 oz (71.1 kg)  04/01/24 163 lb (73.9 kg)  03/25/24  162 lb 9.6 oz (73.8 kg)   Physical Exam HENT:     Head: Normocephalic and atraumatic.     Nose: Nose normal.     Mouth/Throat:     Mouth: Mucous membranes are moist.     Pharynx: Oropharynx is clear.  Eyes:     Extraocular Movements: Extraocular movements intact.     Conjunctiva/sclera: Conjunctivae normal.     Pupils: Pupils are equal, round, and reactive to light.  Cardiovascular:     Rate and Rhythm: Normal rate and regular rhythm.  Pulses: Normal pulses.     Heart sounds: Normal heart sounds.  Pulmonary:     Effort: Pulmonary effort is normal.     Breath sounds: Normal breath sounds.  Abdominal:     General: Bowel sounds are normal.     Palpations: Abdomen is soft.  Musculoskeletal:        General: Normal range of motion.     Cervical back: Normal range of motion and neck supple.  Skin:    General: Skin is warm and dry.  Neurological:     General: No focal deficit present.     Mental Status: She is alert and oriented to person, place, and time.  Psychiatric:        Mood and Affect: Mood normal.        Behavior: Behavior normal.    ASSESSMENT AND PLAN: 1. Weight gain (Primary) - Patient lost 7 pounds since previous office visit.  - Increase Tirzepatide  from 7.5 mg to 10 mg as prescribed. Counseled on medication adherence/adverse effects.  - Follow-up with primary provider in 4 weeks or sooner if needed.  - tirzepatide  10 MG/0.5ML injection vial; Inject 10 mg into the skin once a week.  Dispense: 2 mL; Refill: 0   Patient was given the opportunity to ask questions.  Patient verbalized understanding of the plan and was able to repeat key elements of the plan. Patient was given clear instructions to go to Emergency Department or return to medical center if symptoms don't improve, worsen, or new problems develop.The patient verbalized understanding.   Requested Prescriptions   Signed Prescriptions Disp Refills   tirzepatide  10 MG/0.5ML injection vial 2 mL 0     Sig: Inject 10 mg into the skin once a week.    Return in about 4 weeks (around 05/27/2024) for Follow-Up or next available weight check .  Greig JINNY Chute, NP

## 2024-04-29 NOTE — Telephone Encounter (Signed)
 Copied from CRM #[id number]. Topic: Clinical - Prescription Issue >> Apr 29, 2024  5:36 PM Delon DASEN wrote: Reason for CRM: Need prescription tirzepatide  10 MG/0.5ML injection vial  sent to [facility]- [facility]

## 2024-04-29 NOTE — Progress Notes (Signed)
 Weight check, [facility] zepbound  refill

## 2024-04-30 ENCOUNTER — Other Ambulatory Visit: Payer: Self-pay | Admitting: Family

## 2024-04-30 ENCOUNTER — Telehealth: Payer: Self-pay

## 2024-04-30 ENCOUNTER — Other Ambulatory Visit: Payer: Self-pay | Admitting: Pharmacist

## 2024-04-30 DIAGNOSIS — R635 Abnormal weight gain: Secondary | ICD-10-CM

## 2024-04-30 MED ORDER — TIRZEPATIDE-WEIGHT MANAGEMENT 10 MG/0.5ML ~~LOC~~ SOLN: 10.0000 mg | SUBCUTANEOUS | 0 refills | Status: DC

## 2024-04-30 NOTE — Telephone Encounter (Signed)
 I attempted to add patient's preferred pharmacy and was unsuccessful. Please enter patient's preferred pharmacy, notify me, and I will prescribe at that time.

## 2024-04-30 NOTE — Telephone Encounter (Signed)
 Copied from CRM #[id number]. Topic: Clinical - Prescription Issue >> Apr 30, 2024 11:41 AM Dedra B wrote: Reason for CRM: Pt called to follow up request to have tirzepatide  10 MG/0.5ML injection vial sent to [facility]. [facility]

## 2024-04-30 NOTE — Telephone Encounter (Signed)
 Please refer to my reply from 04/30/24  9:00 AM.

## 2024-05-01 ENCOUNTER — Encounter: Payer: Self-pay | Admitting: General Practice

## 2024-05-01 ENCOUNTER — Other Ambulatory Visit (HOSPITAL_COMMUNITY): Payer: Self-pay

## 2024-05-01 ENCOUNTER — Ambulatory Visit: Attending: General Practice | Admitting: General Practice

## 2024-05-01 VITALS — BP 90/62 | HR 76 | Ht 66.0 in | Wt 158.0 lb

## 2024-05-01 DIAGNOSIS — R9431 Abnormal electrocardiogram [ECG] [EKG]: Secondary | ICD-10-CM | POA: Diagnosis not present

## 2024-05-01 DIAGNOSIS — I4510 Unspecified right bundle-branch block: Secondary | ICD-10-CM | POA: Diagnosis not present

## 2024-05-01 DIAGNOSIS — J4530 Mild persistent asthma, uncomplicated: Secondary | ICD-10-CM | POA: Diagnosis not present

## 2024-05-01 DIAGNOSIS — F419 Anxiety disorder, unspecified: Secondary | ICD-10-CM

## 2024-05-01 DIAGNOSIS — R0789 Other chest pain: Secondary | ICD-10-CM | POA: Diagnosis not present

## 2024-05-01 MED ORDER — METOPROLOL TARTRATE 25 MG PO TABS
12.5000 mg | ORAL_TABLET | Freq: Once | ORAL | 0 refills | Status: AC
  Filled 2024-05-01: qty 1, 2d supply, fill #0

## 2024-05-01 NOTE — Patient Instructions (Addendum)
 Medication Instructions:  TAKE METOPROLOL  TARTRATE 12.5 MG (1/2 TABLET) 2 HOURS PRIOR TO YOUR CTA SCAN.  Lab Work: NUTRITIONAL THERAPIST AND CBC TO BE DONE TODAY.  Testing/Procedures:   Your cardiac CT will be scheduled at one of the below locations:   [facility] [address] [place], [place] [address] [phone] (Severe contrast allergies only)  OR  [facility] [address]  [place], [place] [address]  If scheduled at [facility], please arrive at the [facility] and Children's Entrance (Entrance C2) of [facility] 30 minutes prior to test start time. You can use the FREE valet parking offered at entrance C (encouraged to control the heart rate for the test)  Proceed to the [facility] Radiology Department (first floor) to check-in and test prep.  All radiology patients and guests should use entrance C2 at [facility], accessed from [facility], even though the hospital's physical address listed is [address]  If scheduled at the Heart and Vascular Tower at [address], please enter the parking lot using the [address] entrance and use the FREE valet service at the patient drop-off area. Enter the building and check-in with registration on the main floor. Please follow these instructions carefully (unless otherwise directed):  An IV will be required for this test and Nitroglycerin will be given.     On the Night Before the Test: Be sure to Drink plenty of water. Do not consume any caffeinated/decaffeinated beverages or chocolate 12 hours prior to your test. Do not take any antihistamines 12 hours prior to your test.  On the Day of the Test: Drink plenty of water until 1 hour prior to the test. Do not eat any food 1 hour prior to test. You may take your regular medications prior to the test.  Take METOPROLOL  TARTRATE (Lopressor ) 12.5 MG ()1/2 TABLET two hours prior to test. Patients who  wear a continuous glucose monitor MUST remove the device prior to scanning. FEMALES- please wear underwire-free bra if available, avoid dresses & tight clothing  After the Test: Drink plenty of water. After receiving IV contrast, you may experience a mild flushed feeling. This is normal. On occasion, you may experience a mild rash up to 24 hours after the test. This is not dangerous. If this occurs, you can take Benadryl 25 mg, Zyrtec , Claritin, or Allegra and increase your fluid intake. (Patients taking Tikosyn should avoid Benadryl, and may take Zyrtec , Claritin, or Allegra) If you experience trouble breathing, this can be serious. If it is severe call 911 IMMEDIATELY. If it is mild, please call our office.  We will call to schedule your test 2-4 weeks out understanding that some insurance companies will need an authorization prior to the service being performed.   For more information and frequently asked questions, please visit our website : [email or web address]  For non-scheduling related questions, please contact the cardiac imaging nurse navigator should you have any questions/concerns: Cardiac Imaging Nurse Navigators Direct Office Dial: [phone]   For scheduling needs, including cancellations and rescheduling, please call Brittany, [phone].   Follow-Up: At [facility], you and your health needs are our priority.  As part of our continuing mission to provide you with exceptional heart care, our providers are all part of one team.  This team includes your primary Cardiologist (physician) and Advanced Practice Providers or APPs (Physician Assistants and Nurse Practitioners) who all work together to provide you with the care  you need, when you need it.  Your next appointment:   POST TESTING  Provider:   DR. LONI, MD OR Josefa Beauvais, NP

## 2024-05-02 ENCOUNTER — Ambulatory Visit: Payer: Self-pay | Admitting: General Practice

## 2024-05-02 ENCOUNTER — Other Ambulatory Visit: Payer: Self-pay | Admitting: Family

## 2024-05-02 DIAGNOSIS — R635 Abnormal weight gain: Secondary | ICD-10-CM

## 2024-05-02 LAB — BASIC METABOLIC PANEL WITH GFR
BUN/Creatinine Ratio: 17 (ref 9–23)
BUN: 13 mg/dL (ref 6–20)
CO2: 22 mmol/L (ref 20–29)
Calcium: 8.7 mg/dL (ref 8.7–10.2)
Chloride: 103 mmol/L (ref 96–106)
Creatinine, Ser: 0.75 mg/dL (ref 0.57–1.00)
Glucose: 74 mg/dL (ref 70–99)
Potassium: 3.8 mmol/L (ref 3.5–5.2)
Sodium: 138 mmol/L (ref 134–144)
eGFR: 106 mL/min/1.73 (ref >59)

## 2024-05-02 LAB — CBC
Hematocrit: 37.8 % (ref 34.0–46.6)
Hemoglobin: 12.5 g/dL (ref 11.1–15.9)
MCH: 32 pg (ref 26.6–33.0)
MCHC: 33.1 g/dL (ref 31.5–35.7)
MCV: 97 fL (ref 79–97)
Platelets: 204 x10E3/uL (ref 150–450)
RBC: 3.91 x10E6/uL (ref 3.77–5.28)
RDW: 12.3 % (ref 11.7–15.4)
WBC: 6 x10E3/uL (ref 3.4–10.8)

## 2024-05-02 MED ORDER — TIRZEPATIDE-WEIGHT MANAGEMENT 10 MG/0.5ML ~~LOC~~ SOLN: 10.0000 mg | SUBCUTANEOUS | 0 refills | Status: AC

## 2024-05-02 NOTE — Telephone Encounter (Signed)
 Take next Zepbound  injection 1 week after last dose.

## 2024-05-02 NOTE — Telephone Encounter (Signed)
 Complete

## 2024-05-20 ENCOUNTER — Telehealth (HOSPITAL_COMMUNITY): Payer: Self-pay | Admitting: *Deleted

## 2024-05-20 ENCOUNTER — Other Ambulatory Visit: Payer: Self-pay | Admitting: Family Medicine

## 2024-05-20 DIAGNOSIS — R635 Abnormal weight gain: Secondary | ICD-10-CM

## 2024-05-20 NOTE — Telephone Encounter (Signed)
 Reaching out to patient to offer assistance regarding upcoming cardiac imaging study; pt verbalizes understanding of appt date/time, parking situation and where to check in, pre-test NPO status and medications ordered, and verified current allergies; name and call back number provided for further questions should they arise Sid Seats RN Navigator Cardiac Imaging [facility] [phone] office [phone] cell

## 2024-05-21 ENCOUNTER — Ambulatory Visit (HOSPITAL_COMMUNITY)
Admission: RE | Admit: 2024-05-21 | Discharge: 2024-05-21 | Disposition: A | Discharge status: Home / Self Care | Source: Ambulatory Visit | Attending: Cardiovascular Disease | Admitting: Cardiovascular Disease

## 2024-05-21 DIAGNOSIS — R072 Precordial pain: Secondary | ICD-10-CM | POA: Diagnosis not present

## 2024-05-21 MED ORDER — IOHEXOL 350 MG/ML SOLN
100.0000 mL | Freq: Once | INTRAVENOUS | Status: AC | PRN
  Administered 2024-05-21: 100 mL via INTRAVENOUS

## 2024-05-21 MED ORDER — NITROGLYCERIN 0.4 MG SL SUBL
0.8000 mg | SUBLINGUAL_TABLET | Freq: Once | SUBLINGUAL | Status: AC
  Administered 2024-05-21: 0.8 mg via SUBLINGUAL

## 2024-05-23 ENCOUNTER — Ambulatory Visit: Payer: Self-pay | Admitting: General Practice

## 2024-05-23 DIAGNOSIS — R0789 Other chest pain: Secondary | ICD-10-CM

## 2024-05-23 NOTE — Telephone Encounter (Signed)
 Patient returned staff call regarding results.

## 2024-06-06 ENCOUNTER — Ambulatory Visit: Admitting: Family

## 2024-06-09 ENCOUNTER — Encounter: Admitting: Family

## 2024-06-09 NOTE — Progress Notes (Signed)
 Erroneous encounter-disregard

## 2024-06-27 ENCOUNTER — Encounter: Payer: Self-pay | Admitting: Family

## 2024-06-27 ENCOUNTER — Other Ambulatory Visit: Payer: Self-pay | Admitting: Family

## 2024-06-27 ENCOUNTER — Telehealth: Payer: Self-pay | Admitting: Family

## 2024-06-27 ENCOUNTER — Ambulatory Visit: Payer: Self-pay | Admitting: Family

## 2024-06-27 ENCOUNTER — Other Ambulatory Visit: Payer: Self-pay | Admitting: *Deleted

## 2024-06-27 VITALS — BP 109/74 | HR 81 | Ht 66.0 in | Wt 152.8 lb

## 2024-06-27 DIAGNOSIS — R635 Abnormal weight gain: Secondary | ICD-10-CM

## 2024-06-27 MED ORDER — TIRZEPATIDE-WEIGHT MANAGEMENT 7.5 MG/0.5ML ~~LOC~~ SOLN: 7.5000 mg | SUBCUTANEOUS | 0 refills | Status: DC

## 2024-06-27 NOTE — Telephone Encounter (Unsigned)
 Copied from CRM #[id number]. Topic: Clinical - Prescription Issue >> Jun 27, 2024  1:22 PM Tiffini S wrote: Reason for CRM: Patient states that her medication was sent to the incorrect location today for  tirzepatide  7.5 MG/0.5ML injection vial- should have been mailed to the [facility] and not   [id number]  [address] [place] [address] Phone: [phone]  Fax: [phone]    Patient call the patient back at [phone]

## 2024-06-27 NOTE — Progress Notes (Signed)
 "   Patient ID: Debra Pruitt, female    DOB: 1991-01-06  MRN: [id number]  CC: Weight Check   Subjective: Debra Pruitt is a 34 y.o. female who presents for weight check.   Her concerns today include:  - Doing well on Tirzepatide , no issues/concerns. States due she is at her goal weight she would like to decrease to maintenance dose of 7.5 mg.  - States since losing weight she feels some discomfort as if her cervix is being hit during intercourse. Denies red flag symptoms. She declines screening on today and plans to discuss with Gynecology at later date.   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   Melanocytic nevus of left lower extremity 08/30/2021   Localization-related idiopathic epilepsy and epileptic syndromes with seizures of localized onset, not intractable, without status epilepticus (HCC) 07/28/2021   Migraine headache without aura 07/28/2021   Mild persistent asthma 07/28/2021   Food intolerance 03/10/2021   Cervical radiculopathy 05/31/2020   Vitamin D  deficiency 01/09/2020     Medications Ordered Prior to Encounter[1]  Allergies[2]  Social History   Socioeconomic History   Marital status: Married    Spouse name: Not on file   Number of children: 2   Years of education: Not on file   Highest education level: Some college, no degree  Occupational History   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  Tobacco Use   Smoking status: Never    Passive exposure: Past (When going out)   Smokeless tobacco: Never  Vaping Use   Vaping status: Never Used  Substance and Sexual Activity   Alcohol use: Yes    Alcohol/week: 0.0 standard drinks of alcohol    Comment: Rare - social   Drug use: No   Sexual activity: Yes    Birth control/protection: None  Other Topics Concern   Not on file  Social History Narrative   Not on file    Social Drivers of Health   Tobacco Use: Low Risk (06/27/2024)   Patient History    Smoking Tobacco Use: Never    Smokeless Tobacco Use: Never    Passive Exposure: Past  Financial Resource Strain: Low Risk (04/29/2024)   Overall Financial Resource Strain (CARDIA)    Difficulty of Paying Living Expenses: Not hard at all  Food Insecurity: No Food Insecurity (04/29/2024)   Epic    Worried About Radiation Protection Practitioner of Food in the Last Year: Never true    Ran Out of Food in the Last Year: Never true  Transportation Needs: No Transportation Needs (04/29/2024)   Epic    Lack of Transportation (Medical): No    Lack of Transportation (Non-Medical): No  Physical Activity: Insufficiently Active (04/29/2024)   Exercise Vital Sign    Days of Exercise per Week: 2 days    Minutes of Exercise per Session: 30 min  Stress: Stress Concern Present (04/29/2024)   [employer] of Occupational Health - Occupational Stress Questionnaire    Feeling of Stress: To some extent  Social Connections: Moderately Integrated (04/29/2024)   Social Connection and Isolation Panel    Frequency of Communication with Friends and Family: Three times a week    Frequency of Social Gatherings with Friends and Family: Once a week    Attends Religious Services: 1 to 4 times per year    Active Member of Clubs or Organizations: No    Attends  Club or Organization Meetings: Not on file    Marital Status: Married  Intimate Partner Violence: Not At Risk (12/05/2023)   Epic    Fear of Current or Ex-Partner: No    Emotionally Abused: No    Physically Abused: No    Sexually Abused: No  Depression (PHQ2-9): Low Risk (06/27/2024)   Depression (PHQ2-9)    PHQ-2 Score: 0  Alcohol Screen: Low Risk (04/29/2024)   Alcohol Screen    Last Alcohol Screening Score (AUDIT): 2  Housing: Low Risk (04/29/2024)   Epic    Unable to Pay for Housing in the Last Year: No    Number of Times Moved in the Last Year: 0    Homeless in the Last Year: No   Utilities: Not At Risk (12/05/2023)   Epic    Threatened with loss of utilities: No  Health Literacy: Adequate Health Literacy (12/05/2023)   B1300 Health Literacy    Frequency of need for help with medical instructions: Never    Family History  Problem Relation Age of Onset   Hypertension Mother    Thyroid  disease Mother    HIV Father    Cirrhosis Father    Drug abuse Father    Early death Father    Breast cancer Maternal Aunt    Breast cancer Maternal Grandmother    Asthma Maternal Grandmother    COPD Maternal Grandmother    Hypertension Maternal Grandmother    Miscarriages / Stillbirths Maternal Grandmother    Hypertension Maternal Grandfather    Heart disease Maternal Grandfather    Diabetes Maternal Grandfather    Hypertension Paternal Grandmother    COPD Paternal Grandmother    Heart disease Paternal Grandmother    Hyperlipidemia Paternal Grandmother    Stroke Paternal Grandmother    Diabetes Paternal Grandmother    Hepatitis C Paternal Grandmother    Rheum arthritis Paternal Grandmother    Drug abuse Paternal Grandmother    Colon cancer Neg Hx    Esophageal cancer Neg Hx    Stomach cancer Neg Hx     Past Surgical History:  Procedure Laterality Date   CESAREAN SECTION  04/30/2013   x 2   MULTIPLE TOOTH EXTRACTIONS     SALPINGECTOMY Right    for ectopic pregnancy   TUBAL LIGATION  09/28/16    ROS: Review of Systems Negative except as stated above  PHYSICAL EXAM: BP 109/74 (BP Location: Right Arm, Patient Position: Sitting, Cuff Size: Normal)   Pulse 81   Ht 5' 6 (1.676 m)   Wt 152 lb 12.8 oz (69.3 kg)   LMP 06/14/2024 (Approximate)   SpO2 98%   BMI 24.66 kg/m   Wt Readings from Last 3 Encounters:  06/27/24 152 lb 12.8 oz (69.3 kg)  05/01/24 158 lb (71.7 kg)  04/29/24 156 lb 12.8 oz (71.1 kg)   Physical Exam HENT:     Head: Normocephalic and atraumatic.     Nose: Nose normal.     Mouth/Throat:     Mouth: Mucous membranes are moist.      Pharynx: Oropharynx is clear.  Eyes:     Extraocular Movements: Extraocular movements intact.     Conjunctiva/sclera: Conjunctivae normal.     Pupils: Pupils are equal, round, and reactive to light.  Cardiovascular:     Rate and Rhythm: Normal rate and regular rhythm.     Pulses: Normal pulses.     Heart sounds: Normal heart sounds.  Pulmonary:     Effort: Pulmonary effort  is normal.     Breath sounds: Normal breath sounds.  Musculoskeletal:        General: Normal range of motion.     Cervical back: Normal range of motion and neck supple.  Neurological:     General: No focal deficit present.     Mental Status: She is alert and oriented to person, place, and time.  Psychiatric:        Mood and Affect: Mood normal.        Behavior: Behavior normal.     ASSESSMENT AND PLAN: 1. Weight gain (Primary) - Patient lost 6 pounds since previous office visit.  - Decrease Tirzepatide  from 10 mg to 7.5 mg as prescribed. Counseled on medication adherence/adverse effects.  - Follow-up with primary provider in 4 weeks or sooner if needed.  - tirzepatide  7.5 MG/0.5ML injection vial; Inject 7.5 mg into the skin once a week.  Dispense: 2 mL; Refill: 0  Patient was given the opportunity to ask questions.  Patient verbalized understanding of the plan and was able to repeat key elements of the plan. Patient was given clear instructions to go to Emergency Department or return to medical center if symptoms don't improve, worsen, or new problems develop.The patient verbalized understanding.   Requested Prescriptions   Signed Prescriptions Disp Refills   tirzepatide  7.5 MG/0.5ML injection vial 2 mL 0    Sig: Inject 7.5 mg into the skin once a week.    Return in about 4 weeks (around 07/25/2024) for Follow-Up or next available weight check .  Greig JINNY Chute, NP      [1]  Current Outpatient Medications on File Prior to Visit  Medication Sig Dispense Refill   Multiple Vitamin (MULTIVITAMIN ADULT PO)  Take by mouth.     polyethylene glycol (MIRALAX / GLYCOLAX) 17 g packet Take 17 g by mouth daily. (Patient taking differently: Take 17 g by mouth as needed.)     tirzepatide  10 MG/0.5ML injection vial Inject 10 mg into the skin once a week. 2 mL 0   metoprolol  tartrate (LOPRESSOR ) 25 MG tablet Take 1/2 tablet (12.5 mg total) by mouth once 2 hours prior to your CT scan. 1 tablet 0   No current facility-administered medications on file prior to visit.  [2]  Allergies Allergen Reactions   Ashwagandha-Rhodiola Hives   Mangifera Indica Swelling   Mango Flavoring Agent (Non-Screening) Swelling    Needs removal or agent change.   Pineapple Swelling   Other Hives, Itching and Rash    Goli Nutrition supplement   "

## 2024-07-03 NOTE — Telephone Encounter (Signed)
 Pt verified med: Zepbound  7.5 mg/0.5 ml send- [facility] - [place], [place]

## 2024-07-04 ENCOUNTER — Other Ambulatory Visit: Payer: Self-pay | Admitting: Family

## 2024-07-04 DIAGNOSIS — R635 Abnormal weight gain: Secondary | ICD-10-CM

## 2024-07-04 MED ORDER — TIRZEPATIDE-WEIGHT MANAGEMENT 7.5 MG/0.5ML ~~LOC~~ SOLN: 7.5000 mg | SUBCUTANEOUS | 0 refills | Status: AC

## 2024-07-04 NOTE — Telephone Encounter (Signed)
 Complete

## 2024-07-10 ENCOUNTER — Ambulatory Visit: Admitting: Family

## 2024-07-28 ENCOUNTER — Ambulatory Visit: Payer: Self-pay | Admitting: Family

## 2024-07-30 ENCOUNTER — Encounter: Admitting: Family
